# Patient Record
Sex: Female | Born: 1935 | Race: Black or African American | Hispanic: No | Smoking: Never smoker
Health system: Southern US, Community
[De-identification: ages and names within clinical notes are randomized; demographics above are authoritative.]

## PROBLEM LIST (undated history)

## (undated) DIAGNOSIS — M1990 Unspecified osteoarthritis, unspecified site: Secondary | ICD-10-CM

## (undated) DIAGNOSIS — G4730 Sleep apnea, unspecified: Secondary | ICD-10-CM

## (undated) DIAGNOSIS — E786 Lipoprotein deficiency: Secondary | ICD-10-CM

## (undated) DIAGNOSIS — I10 Essential (primary) hypertension: Secondary | ICD-10-CM

## (undated) DIAGNOSIS — E039 Hypothyroidism, unspecified: Secondary | ICD-10-CM

## (undated) DIAGNOSIS — R519 Headache, unspecified: Secondary | ICD-10-CM

## (undated) DIAGNOSIS — Z8679 Personal history of other diseases of the circulatory system: Secondary | ICD-10-CM

## (undated) DIAGNOSIS — F329 Major depressive disorder, single episode, unspecified: Secondary | ICD-10-CM

## (undated) DIAGNOSIS — R0600 Dyspnea, unspecified: Secondary | ICD-10-CM

## (undated) DIAGNOSIS — F32A Depression, unspecified: Secondary | ICD-10-CM

## (undated) DIAGNOSIS — E785 Hyperlipidemia, unspecified: Secondary | ICD-10-CM

## (undated) DIAGNOSIS — F419 Anxiety disorder, unspecified: Secondary | ICD-10-CM

## (undated) HISTORY — DX: Major depressive disorder, single episode, unspecified: F32.9

## (undated) HISTORY — PX: APPENDECTOMY: SHX54

## (undated) HISTORY — DX: Unspecified osteoarthritis, unspecified site: M19.90

## (undated) HISTORY — PX: ABDOMINAL HYSTERECTOMY: SHX81

## (undated) HISTORY — PX: EYE SURGERY: SHX253

## (undated) HISTORY — DX: Depression, unspecified: F32.A

## (undated) HISTORY — DX: Anxiety disorder, unspecified: F41.9

## (undated) HISTORY — DX: Hyperlipidemia, unspecified: E78.5

---

## 2003-09-17 ENCOUNTER — Other Ambulatory Visit: Payer: Self-pay

## 2005-05-02 ENCOUNTER — Ambulatory Visit: Payer: Self-pay | Admitting: Family Medicine

## 2006-01-13 ENCOUNTER — Ambulatory Visit: Payer: Self-pay | Admitting: Internal Medicine

## 2006-05-08 ENCOUNTER — Ambulatory Visit: Payer: Self-pay | Admitting: Family Medicine

## 2007-01-07 ENCOUNTER — Ambulatory Visit: Payer: Self-pay | Admitting: General Surgery

## 2007-05-20 ENCOUNTER — Ambulatory Visit: Payer: Self-pay | Admitting: Family Medicine

## 2008-05-23 ENCOUNTER — Ambulatory Visit: Payer: Self-pay | Admitting: Family Medicine

## 2008-09-02 DIAGNOSIS — I219 Acute myocardial infarction, unspecified: Secondary | ICD-10-CM

## 2008-09-02 HISTORY — DX: Acute myocardial infarction, unspecified: I21.9

## 2008-11-08 ENCOUNTER — Inpatient Hospital Stay: Payer: Self-pay | Admitting: Cardiology

## 2008-12-09 ENCOUNTER — Ambulatory Visit: Payer: Self-pay | Admitting: Family Medicine

## 2009-02-28 ENCOUNTER — Emergency Department: Payer: Self-pay | Admitting: Emergency Medicine

## 2009-05-30 ENCOUNTER — Ambulatory Visit: Payer: Self-pay | Admitting: Family Medicine

## 2009-07-10 ENCOUNTER — Ambulatory Visit: Payer: Self-pay | Admitting: Ophthalmology

## 2009-07-20 ENCOUNTER — Ambulatory Visit: Payer: Self-pay | Admitting: Ophthalmology

## 2010-01-10 ENCOUNTER — Ambulatory Visit: Payer: Self-pay | Admitting: Family Medicine

## 2010-06-15 ENCOUNTER — Ambulatory Visit: Payer: Self-pay | Admitting: Family Medicine

## 2011-03-03 ENCOUNTER — Ambulatory Visit: Payer: Self-pay | Admitting: Internal Medicine

## 2011-05-29 ENCOUNTER — Ambulatory Visit: Payer: Self-pay | Admitting: Family Medicine

## 2011-06-19 ENCOUNTER — Ambulatory Visit: Payer: Self-pay | Admitting: Family Medicine

## 2011-12-28 ENCOUNTER — Emergency Department: Payer: Self-pay | Admitting: Internal Medicine

## 2012-06-10 ENCOUNTER — Ambulatory Visit: Payer: Self-pay | Admitting: Family Medicine

## 2012-06-22 ENCOUNTER — Ambulatory Visit: Payer: Self-pay | Admitting: Family Medicine

## 2012-08-09 ENCOUNTER — Emergency Department: Payer: Self-pay | Admitting: Emergency Medicine

## 2012-08-09 ENCOUNTER — Inpatient Hospital Stay (HOSPITAL_COMMUNITY)
Admission: EM | Admit: 2012-08-09 | Discharge: 2012-08-11 | DRG: 086 | Disposition: A | Discharge status: Home / Self Care | Payer: Medicare Other | Source: Other Acute Inpatient Hospital | Attending: Neurological Surgery | Admitting: Neurological Surgery

## 2012-08-09 ENCOUNTER — Encounter (HOSPITAL_COMMUNITY): Payer: Self-pay

## 2012-08-09 ENCOUNTER — Inpatient Hospital Stay (HOSPITAL_COMMUNITY): Payer: Medicare Other

## 2012-08-09 DIAGNOSIS — S065X9A Traumatic subdural hemorrhage with loss of consciousness of unspecified duration, initial encounter: Secondary | ICD-10-CM

## 2012-08-09 DIAGNOSIS — S065X0A Traumatic subdural hemorrhage without loss of consciousness, initial encounter: Principal | ICD-10-CM | POA: Diagnosis present

## 2012-08-09 DIAGNOSIS — Z79899 Other long term (current) drug therapy: Secondary | ICD-10-CM

## 2012-08-09 DIAGNOSIS — Z7982 Long term (current) use of aspirin: Secondary | ICD-10-CM

## 2012-08-09 DIAGNOSIS — E039 Hypothyroidism, unspecified: Secondary | ICD-10-CM | POA: Diagnosis present

## 2012-08-09 DIAGNOSIS — I10 Essential (primary) hypertension: Secondary | ICD-10-CM | POA: Diagnosis present

## 2012-08-09 DIAGNOSIS — W19XXXA Unspecified fall, initial encounter: Secondary | ICD-10-CM | POA: Diagnosis present

## 2012-08-09 DIAGNOSIS — Z6841 Body Mass Index (BMI) 40.0 and over, adult: Secondary | ICD-10-CM

## 2012-08-09 HISTORY — DX: Hypothyroidism, unspecified: E03.9

## 2012-08-09 HISTORY — DX: Sleep apnea, unspecified: G47.30

## 2012-08-09 HISTORY — DX: Essential (primary) hypertension: I10

## 2012-08-09 LAB — CBC
HCT: 30.4 % — ABNORMAL LOW (ref 35.0–47.0)
HGB: 9.8 g/dL — ABNORMAL LOW (ref 12.0–16.0)
MCH: 29.5 pg (ref 26.0–34.0)
MCHC: 32.3 g/dL (ref 32.0–36.0)
MCV: 91 fL (ref 80–100)
Platelet: 281 10*3/uL (ref 150–440)
RBC: 3.32 10*6/uL — ABNORMAL LOW (ref 3.80–5.20)
RDW: 17.7 % — ABNORMAL HIGH (ref 11.5–14.5)
WBC: 4.6 10*3/uL (ref 3.6–11.0)

## 2012-08-09 LAB — COMPREHENSIVE METABOLIC PANEL
Albumin: 3.3 g/dL — ABNORMAL LOW (ref 3.4–5.0)
Alkaline Phosphatase: 104 U/L (ref 50–136)
Anion Gap: 9 (ref 7–16)
BUN: 14 mg/dL (ref 7–18)
Bilirubin,Total: 0.6 mg/dL (ref 0.2–1.0)
Calcium, Total: 9.6 mg/dL (ref 8.5–10.1)
Chloride: 102 mmol/L (ref 98–107)
Co2: 24 mmol/L (ref 21–32)
Creatinine: 0.84 mg/dL (ref 0.60–1.30)
EGFR (African American): >60
EGFR (Non-African Amer.): >60
Glucose: 112 mg/dL — ABNORMAL HIGH (ref 65–99)
Osmolality: 271 (ref 275–301)
Potassium: 3.6 mmol/L (ref 3.5–5.1)
SGOT(AST): 20 U/L (ref 15–37)
SGPT (ALT): 14 U/L (ref 12–78)
Sodium: 135 mmol/L — ABNORMAL LOW (ref 136–145)
Total Protein: 8.1 g/dL (ref 6.4–8.2)

## 2012-08-09 LAB — URINALYSIS, COMPLETE
Bacteria: NONE SEEN
Bilirubin,UR: NEGATIVE
Blood: NEGATIVE
Glucose,UR: NEGATIVE mg/dL (ref 0–75)
Nitrite: NEGATIVE
Ph: 6 (ref 4.5–8.0)
Protein: NEGATIVE
RBC,UR: 3 /HPF (ref 0–5)
Specific Gravity: 1.023 (ref 1.003–1.030)
Squamous Epithelial: 4
WBC UR: 5 /HPF (ref 0–5)

## 2012-08-09 LAB — CK TOTAL AND CKMB (NOT AT ARMC)
CK, Total: 39 U/L (ref 21–215)
CK-MB: <0.5 ng/mL — ABNORMAL LOW (ref 0.5–3.6)

## 2012-08-09 LAB — TROPONIN I: Troponin-I: <0.02 ng/mL

## 2012-08-09 MED ORDER — POTASSIUM CHLORIDE CRYS ER 20 MEQ PO TBCR
20.0000 meq | EXTENDED_RELEASE_TABLET | Freq: Two times a day (BID) | ORAL | Status: DC
Start: 2012-08-09 — End: 2012-08-11
  Administered 2012-08-10 – 2012-08-11 (×2): 20 meq via ORAL
  Filled 2012-08-09 (×5): qty 1

## 2012-08-09 MED ORDER — SODIUM CHLORIDE 0.9 % IV SOLN: 250.0000 mL | INTRAVENOUS | Status: DC | PRN

## 2012-08-09 MED ORDER — SODIUM CHLORIDE 0.9 % IJ SOLN
3.0000 mL | Freq: Two times a day (BID) | INTRAMUSCULAR | Status: DC
  Administered 2012-08-10 (×2): 3 mL via INTRAVENOUS

## 2012-08-09 MED ORDER — ALUM & MAG HYDROXIDE-SIMETH 200-200-20 MG/5ML PO SUSP: 30.0000 mL | Freq: Four times a day (QID) | ORAL | Status: DC | PRN

## 2012-08-09 MED ORDER — SODIUM CHLORIDE 0.9 % IJ SOLN: 3.0000 mL | Freq: Two times a day (BID) | INTRAMUSCULAR | Status: DC

## 2012-08-09 MED ORDER — SODIUM CHLORIDE 0.9 % IJ SOLN: 3.0000 mL | INTRAMUSCULAR | Status: DC | PRN

## 2012-08-09 MED ORDER — ZOLPIDEM TARTRATE 5 MG PO TABS: 5.0000 mg | ORAL_TABLET | Freq: Every evening | ORAL | Status: DC | PRN

## 2012-08-09 MED ORDER — LEVOTHYROXINE SODIUM 100 MCG PO TABS
100.0000 ug | ORAL_TABLET | Freq: Every day | ORAL | Status: DC
  Administered 2012-08-11: 100 ug via ORAL
  Filled 2012-08-09 (×3): qty 1

## 2012-08-09 MED ORDER — HYDROCHLOROTHIAZIDE 25 MG PO TABS
25.0000 mg | ORAL_TABLET | Freq: Every day | ORAL | Status: DC
  Administered 2012-08-11: 25 mg via ORAL
  Filled 2012-08-09 (×2): qty 1

## 2012-08-09 MED ORDER — POLYETHYLENE GLYCOL 3350 17 G PO PACK
17.0000 g | PACK | Freq: Every day | ORAL | Status: DC
  Administered 2012-08-11: 17 g via ORAL
  Filled 2012-08-09 (×2): qty 1

## 2012-08-09 NOTE — H&P (Signed)
Danielle [name] is an 78 y.o. female.   Chief Complaint: Right parietal subdural hematoma with shift HPI: Patient is an 78 year old right-handed individual who tells me that about a month ago she had a fall where she struck her head in the right frontal region on some of the furniture. Since that time she's had progressively worsening headache and just recently was noted that her left upper extremity exhibits some weakness she is taken to [facility] hospital emergency room today where CT scan demonstrates the presence of a subacute subdural hematoma in the right parietal region with shift and mass effect causing 4 mm midline shift. Patient was referred here for further definitive care. Up until this point the patient has enjoyed reasonably good health having only minimal medical problems including hypertension and some hypothyroidism.  Past Medical History  Diagnosis Date  . Hypertension     No past surgical history on file.  No family history on file. Social History:  does not have a smoking history on file. She does not have any smokeless tobacco history on file. Her alcohol and drug histories not on file.  Allergies:  Allergies  Allergen Reactions  . Penicillins     Unknown    Medications Prior to Admission  Medication Sig Dispense Refill  . aspirin 81 MG chewable tablet Chew 81 mg by mouth daily.      . hydrochlorothiazide (HYDRODIURIL) 25 MG tablet Take 25 mg by mouth daily.      Marland Kitchen levothyroxine (SYNTHROID, LEVOTHROID) 100 MCG tablet Take 100 mcg by mouth daily.      . polyethylene glycol (MIRALAX / GLYCOLAX) packet Take 17 g by mouth daily as needed. For constipation      . potassium chloride SA (K-DUR,KLOR-CON) 20 MEQ tablet Take 20 mEq by mouth 2 (two) times daily.      Marland Kitchen zolpidem (AMBIEN) 5 MG tablet Take 5 mg by mouth at bedtime as needed. For insomnia        No results found for this or any previous visit (from the past 48 hour(s)). No results found.  Review of Systems   Eyes: Negative.   Respiratory: Negative.   Cardiovascular: Negative.   Gastrointestinal: Negative.   Genitourinary: Negative.   Musculoskeletal: Negative.   Skin: Negative.   Neurological: Positive for weakness and headaches.  Endo/Heme/Allergies: Negative.   Psychiatric/Behavioral: Negative.     Blood pressure 136/49, pulse 95, resp. rate 21, height 5\' 3"  (1.6 m), weight 123.9 kg (273 lb 2.4 oz), SpO2 100.00%. Physical Exam  Constitutional: She is oriented to person, place, and time. She appears well-developed and well-nourished.  HENT:  Head: Normocephalic and atraumatic.  Eyes: Conjunctivae normal and EOM are normal. Pupils are equal, round, and reactive to light.  Neck: Normal range of motion. Neck supple.  Cardiovascular: Normal rate, regular rhythm and normal heart sounds.   Respiratory: Effort normal and breath sounds normal.  GI: Soft. Bowel sounds are normal.  Musculoskeletal: Normal range of motion.  Neurological: She is alert and oriented to person, place, and time.       Mild left-sided drift in upper extremity only  Skin: Skin is warm and dry.  Psychiatric: She has a normal mood and affect. Her behavior is normal. Judgment and thought content normal.     Assessment/Plan Subacute subdural hematoma right parietal region with shift and mass effect. Patient is to be admitted now to be made ready for surgery to evacuate the subdural hematoma.  Arlean Thies J 08/09/2012, 10:34 PM

## 2012-08-10 ENCOUNTER — Encounter (HOSPITAL_COMMUNITY): Payer: Self-pay

## 2012-08-10 ENCOUNTER — Encounter (HOSPITAL_COMMUNITY)
Admission: EM | Disposition: A | Discharge status: Home / Self Care | Payer: Self-pay | Source: Other Acute Inpatient Hospital | Attending: Neurological Surgery

## 2012-08-10 LAB — CBC
HCT: 27.4 % — ABNORMAL LOW (ref 36.0–46.0)
Hemoglobin: 9.2 g/dL — ABNORMAL LOW (ref 12.0–15.0)
MCH: 29.3 pg (ref 26.0–34.0)
MCHC: 33.6 g/dL (ref 30.0–36.0)
MCV: 87.3 fL (ref 78.0–100.0)
Platelets: 271 10*3/uL (ref 150–400)
RBC: 3.14 MIL/uL — ABNORMAL LOW (ref 3.87–5.11)
RDW: 17 % — ABNORMAL HIGH (ref 11.5–15.5)
WBC: 3.9 10*3/uL — ABNORMAL LOW (ref 4.0–10.5)

## 2012-08-10 LAB — BASIC METABOLIC PANEL
BUN: 14 mg/dL (ref 6–23)
CO2: 26 mEq/L (ref 19–32)
Calcium: 10 mg/dL (ref 8.4–10.5)
Chloride: 98 mEq/L (ref 96–112)
Creatinine, Ser: 0.75 mg/dL (ref 0.50–1.10)
GFR calc Af Amer: 90 mL/min — ABNORMAL LOW (ref >90)
GFR calc non Af Amer: 77 mL/min — ABNORMAL LOW (ref >90)
Glucose, Bld: 106 mg/dL — ABNORMAL HIGH (ref 70–99)
Potassium: 3.6 mEq/L (ref 3.5–5.1)
Sodium: 133 mEq/L — ABNORMAL LOW (ref 135–145)

## 2012-08-10 LAB — MRSA PCR SCREENING: MRSA by PCR: NEGATIVE

## 2012-08-10 SURGERY — CRANIOTOMY HEMATOMA EVACUATION SUBDURAL
Anesthesia: General | Site: Head | Laterality: Right

## 2012-08-10 MED ORDER — MORPHINE SULFATE 2 MG/ML IJ SOLN: 1.0000 mg | INTRAMUSCULAR | Status: DC | PRN

## 2012-08-10 MED ORDER — OXYCODONE-ACETAMINOPHEN 5-325 MG PO TABS
1.0000 | ORAL_TABLET | ORAL | Status: DC | PRN
  Administered 2012-08-10: 1 via ORAL
  Filled 2012-08-10: qty 1

## 2012-08-10 NOTE — Progress Notes (Signed)
Patient ID: Danielle [name], female   DOB: September 18, 1928, 78 y.o.   MRN: [id number] Patient was scheduled for surgery today. Patient has a right parietal subdural hematoma which is subacute in nature. Is approximately 4 mm of shift. Patient has been having headaches. She denies them today. The patient also had some episodes of left upper extremity weakness, she has not had episodes of weakness while here in the hospital. After extensive discussion with her family the patient declined surgery at this time she wishes to be discharged home. I discussed with him the possible occurrences after discharge that she may have further deterioration in surgery may become a more urgent matter. I will schedule followup visit in a month's time if things are well otherwise. She may have her regular diet at this time.

## 2012-08-10 NOTE — Progress Notes (Signed)
UR completed 

## 2012-08-10 NOTE — Discharge Summary (Addendum)
Physician Discharge Summary  Patient ID: Danielle [name] MRN: [id number] DOB/AGE: [birth year] 78 y.o.  Admit date: 08/09/2012 Discharge date: 08/10/2012  Admission Diagnoses: Subacute subdural hematoma right parietal , Discharge Diagnoses: Subacute subdural hematoma, right parietal,MORBID oBESITY; BMI 48 Active Problems:  * No active hospital problems. *    Discharged Condition: fair  Hospital Course: Patient was admitted being transferred from [facility] hospital for complaints of left-sided weakness and headache that had been ongoing for over a week's period time. Patient had a large subdural hematoma that was subacute in nature CT scan she had 4 mm of shift. Patient was advised regarding the need for surgery. She was scheduled for surgery on the afternoon of the ninth, however after discussions with the patient's family she declined surgical intervention. She is discharged home. She has been cautioned regarding the possible adverse outcomes of her decision. At this time however she declined surgery.  Consults: None  Significant Diagnostic Studies: None  Treatments: None  Discharge Exam: Blood pressure 131/51, pulse 81, temperature 99.2 F (37.3 C), temperature source Oral, resp. rate 14, height 5\' 3"  (1.6 m), weight 123.9 kg (273 lb 2.4 oz), SpO2 99.00%. Patient is alert and oriented she does not exhibit any signs of left-sided weakness at this time station and gait are intact  Disposition: Discharge home  Discharge Orders    Future Orders Please Complete By Expires   Diet - low sodium heart healthy      Increase activity slowly          Medication List     As of 08/10/2012  7:08 PM    TAKE these medications         aspirin 81 MG chewable tablet   Chew 81 mg by mouth daily.      hydrochlorothiazide 25 MG tablet   Commonly known as: HYDRODIURIL   Take 25 mg by mouth daily.      levothyroxine 100 MCG tablet   Commonly known as: SYNTHROID, LEVOTHROID   Take 100  mcg by mouth daily.      polyethylene glycol packet   Commonly known as: MIRALAX / GLYCOLAX   Take 17 g by mouth daily as needed. For constipation      potassium chloride SA 20 MEQ tablet   Commonly known as: K-DUR,KLOR-CON   Take 20 mEq by mouth 2 (two) times daily.      zolpidem 5 MG tablet   Commonly known as: AMBIEN   Take 5 mg by mouth at bedtime as needed. For insomnia         Signed: Stefani Dama 08/10/2012, 7:08 PM

## 2012-08-11 NOTE — Clinical Documentation Improvement (Signed)
BMI DOCUMENTATION CLARIFICATION QUERY  THIS DOCUMENT IS NOT A PERMANENT PART OF THE MEDICAL RECORD         08/11/12  Dear Dr. Danielle Dess,  In an effort to better capture your patient's severity of illness, reflect appropriate length of stay and utilization of resources, a review of the patient medical record has revealed the following indicators.   Based on your clinical judgment, please clarify and document in a progress note and/or discharge summary the clinical condition associated with the following supporting information: In responding to this query please exercise your independent judgment.  The fact that a query is asked, does not imply that any particular answer is desired or expected.    Hello Dr. Danielle Dess!  According to the documented Height and Weight in CHL/EPIC, the patients BMI is 48.39. If your clinical findings/judgment agrees with this,if possible could you please help clarify the suspected diagnosis in the progress note and discharge summary. THANK YOU!    [facility]: A diagnosis of UNDERWEIGHT or MORBID OBESITY should have the BMI documented along with it.  Possible Clinical Conditions?  - Morbid Obesity  - Other condition (please document in the progress notes and/or discharge summary)  - Cannot Clinically determine at this time   Supporting Information:  Estimated Body mass index is 48.39 kg/(m^2) as calculated from the following:   Height as of this encounter: 5\' 3" (1.6 m).   Weight as of this encounter: 273 lb 2.4 oz(123.9 kg).    additional documentation in chart upon review. SM   Thank You,  Saul Fordyce  Clinical Documentation Specialist: [phone] Pager  Health Information Management [place]  Addendum written and discharge note

## 2012-08-11 NOTE — Progress Notes (Addendum)
Pt given d/c instructions, reviewed medications, f/u, resources, etc. Pt refused mychart offer d/t the fact that she does not have a computer. Pt and family are requesting CT scan results, medical records contacted and papers completed.  Lennie Muckle Leigh/   12:20 PM  Pt given cd of chest xray and head CT imaging. Awaiting 7-10 days of processing until reports can be released. Pt and family aware.   Pt's slurred speech resolved.   Holly Bodily

## 2012-08-11 NOTE — Progress Notes (Signed)
Pt d/c home via wheelchair by NT with niece and daughter. Pt requested to be taken by medical records to give consent for requested records to be mailed to her home. Rolling walker sent w/ pt. Pt tolerated use well while ambulating around the room.   Holly Bodily

## 2012-08-11 NOTE — Progress Notes (Signed)
Pt noted to be having slurred speech and is somewhat slower with movements and speech. Per other RN, pt has had this before while here in the hospital. Pt otherwise unchanged neurologically. Dr. Danielle Dess notified. No orders received. Pt ok to d/c home per MD.   Holly Bodily

## 2012-08-11 NOTE — Plan of Care (Signed)
Problem: Consults Goal: Diagnosis - Craniotomy Outcome: Not Applicable Date Met:  08/11/12 Subdural hematoma

## 2013-06-21 ENCOUNTER — Encounter: Payer: Self-pay | Admitting: Podiatry

## 2013-06-21 ENCOUNTER — Ambulatory Visit (INDEPENDENT_AMBULATORY_CARE_PROVIDER_SITE_OTHER): Payer: 59 | Admitting: Podiatry

## 2013-06-21 VITALS — BP 159/83 | HR 77 | Resp 16 | Ht 63.0 in | Wt 272.0 lb

## 2013-06-21 DIAGNOSIS — B351 Tinea unguium: Secondary | ICD-10-CM

## 2013-06-21 DIAGNOSIS — M79609 Pain in unspecified limb: Secondary | ICD-10-CM | POA: Insufficient documentation

## 2013-06-21 NOTE — Progress Notes (Signed)
Danielle [name] presents today for painful toenails one through 5 bilateral.  Objective: Pulses palpable bilateral. Painful elongated toenails one through 5 bilateral.  Assessment: Pain in limb secondary to onychomycosis.  Plan: Debridement of nails in thickness was cover service pain. Followup with her in 3 months.

## 2013-06-23 ENCOUNTER — Ambulatory Visit: Payer: Self-pay | Admitting: Family Medicine

## 2013-09-20 ENCOUNTER — Ambulatory Visit (INDEPENDENT_AMBULATORY_CARE_PROVIDER_SITE_OTHER): Payer: 59 | Admitting: Podiatry

## 2013-09-20 ENCOUNTER — Encounter: Payer: Self-pay | Admitting: Podiatry

## 2013-09-20 VITALS — BP 130/68 | HR 79 | Resp 18

## 2013-09-20 DIAGNOSIS — M79609 Pain in unspecified limb: Secondary | ICD-10-CM

## 2013-09-20 DIAGNOSIS — B351 Tinea unguium: Secondary | ICD-10-CM

## 2013-09-20 NOTE — Progress Notes (Signed)
Just the nails.  Objective: Vital signs are stable she is alert and oriented x3. Her nails are thick yellow dystrophic clinically mycotic and painful palpation she also has a corn to the PIPJ fourth digit of the right foot.  Assessment: Pain in limb secondary to onychomycosis 1 through 5 bilateral. Reactive hyperkeratotic lesion dorsal aspect PIPJ fourth digit right foot.  Plan: Debridement of all reactive hyperkeratosis. Debridement of nails 1 through 5 bilateral covered service secondary to pain. Followup with her in 2-3 months.

## 2013-12-20 ENCOUNTER — Other Ambulatory Visit: Payer: Self-pay | Admitting: *Deleted

## 2013-12-20 ENCOUNTER — Ambulatory Visit (INDEPENDENT_AMBULATORY_CARE_PROVIDER_SITE_OTHER): Payer: 59 | Admitting: Podiatry

## 2013-12-20 VITALS — BP 142/77 | HR 65 | Resp 16

## 2013-12-20 DIAGNOSIS — M79609 Pain in unspecified limb: Secondary | ICD-10-CM

## 2013-12-20 DIAGNOSIS — B351 Tinea unguium: Secondary | ICD-10-CM

## 2013-12-20 NOTE — Progress Notes (Signed)
She presents today with a chief complaint of painful elongated toenails one through 5 bilateral.  Objective: Vital signs are stable she is alert and oriented x3 pulses are palpable nails are thick yellow dystrophic with mycotic and painful palpation.  Assessment: Pain in limb secondary to onychomycosis 1 through 5 bilateral.  Plan: Debridement of nails 1 through 5 bilateral is cover service secondary to pain followup with her in 3 months.

## 2014-03-21 ENCOUNTER — Ambulatory Visit (INDEPENDENT_AMBULATORY_CARE_PROVIDER_SITE_OTHER): Payer: 59 | Admitting: Podiatry

## 2014-03-21 VITALS — BP 138/62 | HR 83 | Resp 16

## 2014-03-21 DIAGNOSIS — B351 Tinea unguium: Secondary | ICD-10-CM

## 2014-03-21 DIAGNOSIS — M79676 Pain in unspecified toe(s): Secondary | ICD-10-CM

## 2014-03-21 DIAGNOSIS — M79609 Pain in unspecified limb: Secondary | ICD-10-CM

## 2014-03-21 NOTE — Progress Notes (Signed)
She presents today with her daughter for chief complaint of painful elongated toenails.  Objective: Pulses are palpable bilateral. Pain in limb secondary to onychomycosis 1 through 5 bilateral. Hammertoe deformity fourth right.  Assessment: Pain in limb secondary to onychomycosis 1 through 5 bilateral.  Plan: Debridement of nails 1 through 5 bilateral covered service secondary to pain.

## 2014-05-29 DIAGNOSIS — I10 Essential (primary) hypertension: Secondary | ICD-10-CM | POA: Insufficient documentation

## 2014-05-29 DIAGNOSIS — I2510 Atherosclerotic heart disease of native coronary artery without angina pectoris: Secondary | ICD-10-CM | POA: Insufficient documentation

## 2014-05-29 DIAGNOSIS — E785 Hyperlipidemia, unspecified: Secondary | ICD-10-CM | POA: Insufficient documentation

## 2014-06-22 ENCOUNTER — Ambulatory Visit: Payer: 59 | Admitting: Podiatry

## 2014-06-24 ENCOUNTER — Ambulatory Visit: Payer: Self-pay | Admitting: Family Medicine

## 2014-07-20 ENCOUNTER — Ambulatory Visit (INDEPENDENT_AMBULATORY_CARE_PROVIDER_SITE_OTHER): Payer: 59 | Admitting: Podiatry

## 2014-07-20 DIAGNOSIS — M79676 Pain in unspecified toe(s): Secondary | ICD-10-CM

## 2014-07-20 DIAGNOSIS — B351 Tinea unguium: Secondary | ICD-10-CM

## 2014-07-20 NOTE — Progress Notes (Signed)
Presents today chief complaint of painful elongated toenails.  Objective: Pulses are palpable bilateral nails are thick, yellow dystrophic onychomycosis and painful palpation.   Assessment: Onychomycosis with pain in limb.  Plan: Treatment of nails in thickness and length as covered service secondary to pain.  

## 2014-10-26 ENCOUNTER — Ambulatory Visit (INDEPENDENT_AMBULATORY_CARE_PROVIDER_SITE_OTHER): Payer: Medicare Other | Admitting: Podiatry

## 2014-10-26 ENCOUNTER — Encounter: Payer: Self-pay | Admitting: Podiatry

## 2014-10-26 DIAGNOSIS — M79676 Pain in unspecified toe(s): Secondary | ICD-10-CM

## 2014-10-26 DIAGNOSIS — L84 Corns and callosities: Secondary | ICD-10-CM

## 2014-10-26 DIAGNOSIS — B351 Tinea unguium: Secondary | ICD-10-CM

## 2014-10-26 NOTE — Progress Notes (Signed)
Presents today chief complaint of painful elongated toenails and painful porokeratotic callus to the dorsal aspect fourth digit right foot.  Objective: Pulses are palpable bilateral nails are thick, yellow dystrophic onychomycosis and painful palpation. Thick porokeratotic lesion without erythema or signs of infection DIPJ fourth digit right.  Assessment: Onychomycosis with pain in limb. Pain in limb associated with porokeratosis fourth digit right foot. Hammertoe deformity fourth right. Diabetes.  Plan: Treatment of nails in thickness and length as covered service secondary to pain. Debrided reactive hyperkeratosis.

## 2015-01-09 ENCOUNTER — Ambulatory Visit (INDEPENDENT_AMBULATORY_CARE_PROVIDER_SITE_OTHER): Payer: Medicare Other | Admitting: Podiatry

## 2015-01-09 DIAGNOSIS — M79676 Pain in unspecified toe(s): Secondary | ICD-10-CM | POA: Diagnosis not present

## 2015-01-09 DIAGNOSIS — B351 Tinea unguium: Secondary | ICD-10-CM | POA: Diagnosis not present

## 2015-01-09 DIAGNOSIS — L84 Corns and callosities: Secondary | ICD-10-CM

## 2015-01-09 NOTE — Progress Notes (Signed)
Presents today chief complaint of painful elongated toenails and painful porokeratotic callus to the dorsal aspect fourth digit right foot.  Objective: Pulses are palpable bilateral nails are thick, yellow dystrophic onychomycosis and painful palpation. Thick porokeratotic lesion without erythema or signs of infection DIPJ fourth digit right.  Assessment: Onychomycosis 1-5 bilateral with pain in limb. Pain in limb associated with porokeratosis fourth digit right foot. Hammertoe deformity fourth right. Diabetes.  Plan: Treatment of nails 1-5  bilateral in thickness and length as covered service secondary to pain. Debrided reactive hyperkeratosis  R 4th toe..Marland Kitchen

## 2015-04-11 ENCOUNTER — Ambulatory Visit (INDEPENDENT_AMBULATORY_CARE_PROVIDER_SITE_OTHER): Payer: Medicare Other | Admitting: Podiatry

## 2015-04-11 DIAGNOSIS — M79676 Pain in unspecified toe(s): Secondary | ICD-10-CM | POA: Diagnosis not present

## 2015-04-11 DIAGNOSIS — B351 Tinea unguium: Secondary | ICD-10-CM

## 2015-04-11 NOTE — Progress Notes (Signed)
Subjective: 80 y.o. returns the office today for painful, elongated, thickened toenails which she is unable to trim herself. Denies any redness or drainage around the nails. States shoes cause irritation to the toenails. Denies any acute changes since last appointment and no new complaints today. Denies any systemic complaints such as fevers, chills, nausea, vomiting.   Objective: AAO 3, NAD DP/PT pulses palpable, CRT less than 3 seconds Nails hypertrophic, dystrophic, elongated, brittle, discolored 10. There is tenderness overlying the nails 1-5 bilatearlly. There is no surrounding erythema or drainage along the nail sites. Right 4th digit previous hyperkerotic lesion stable without any significant build-up at this time.  No open lesions or pre-ulcerative lesions are identified. No other areas of tenderness bilateral lower extremities. No overlying edema, erythema, increased warmth. No pain with calf compression, swelling, warmth, erythema.  Assessment: Patient presents with symptomatic onychomycosis  Plan: -Treatment options including alternatives, risks, complications were discussed -Nails sharply debrided 10 without complication/bleeding. -Discussed daily foot inspection. If there are any changes, to call the office immediately.  -Follow-up in 3 months or sooner if any problems are to arise. In the meantime, encouraged to call the office with any questions, concerns, changes symptoms.   Ovid Curd, DPM

## 2015-07-11 ENCOUNTER — Ambulatory Visit (INDEPENDENT_AMBULATORY_CARE_PROVIDER_SITE_OTHER): Payer: Medicare Other | Admitting: Sports Medicine

## 2015-07-11 ENCOUNTER — Ambulatory Visit: Payer: Medicare Other

## 2015-07-11 ENCOUNTER — Ambulatory Visit: Payer: Medicare Other | Admitting: Podiatry

## 2015-07-11 ENCOUNTER — Encounter: Payer: Self-pay | Admitting: Sports Medicine

## 2015-07-11 DIAGNOSIS — M79676 Pain in unspecified toe(s): Secondary | ICD-10-CM

## 2015-07-11 DIAGNOSIS — M2040 Other hammer toe(s) (acquired), unspecified foot: Secondary | ICD-10-CM

## 2015-07-11 DIAGNOSIS — B351 Tinea unguium: Secondary | ICD-10-CM | POA: Diagnosis not present

## 2015-07-11 NOTE — Progress Notes (Signed)
Patient ID: Danielle [name], female   DOB: 11/04/1928, 81 y.o.   MRN: [id number] Subjective: Danielle [name] is a 81 y.o. female patient seen today in office with complaint of painful thickened and elongated toenails; unable to trim. Patient denies history of Diabetes, Neuropathy, or Vascular disease. Patient has no other pedal complaints at this time.   Patient Active Problem List   Diagnosis Date Noted  . Pain in limb 06/21/2013  . Dermatophytosis of nail 06/21/2013   Current Outpatient Prescriptions on File Prior to Visit  Medication Sig Dispense Refill  . hydrochlorothiazide (HYDRODIURIL) 25 MG tablet Take 25 mg by mouth daily.    Marland Kitchen. levothyroxine (SYNTHROID, LEVOTHROID) 100 MCG tablet Take 100 mcg by mouth daily.    . metoprolol succinate (TOPROL-XL) 25 MG 24 hr tablet     . polyethylene glycol (MIRALAX / GLYCOLAX) packet Take 17 g by mouth daily as needed. For constipation    . potassium chloride SA (K-DUR,KLOR-CON) 20 MEQ tablet Take 20 mEq by mouth 2 (two) times daily.    Marland Kitchen. zolpidem (AMBIEN) 5 MG tablet Take 5 mg by mouth at bedtime as needed. For insomnia     No current facility-administered medications on file prior to visit.   Allergies  Allergen Reactions  . Penicillins     Unknown Unknown  . Penicillin V Potassium Nausea And Vomiting and Rash     Objective: Physical Exam  General: Well developed, nourished, no acute distress, awake, alert and oriented x 3. Cane assisted gait.   Vascular: Dorsalis pedis artery 1/4 bilateral, Posterior tibial artery 1/4 bilateral, skin temperature warm to warm proximal to distal bilateral lower extremities, no varicosities, scant pedal hair present bilateral.  Neurological: Gross sensation present via light touch bilateral.   Dermatological: Skin is warm, dry, and supple bilateral, Nails 1-10 are tender, long, thick, and discolored with mild subungal debris, no webspace macerations present bilateral, no open lesions present bilateral,  asymptomatic corns with no keratotic build up lesser hammertoes bilateral with no signs of infection bilateral.  Musculoskeletal: Asymptomatic hammetoe boney deformities noted bilateral. Muscular strength within normal limits without pain or limitation on range of motion. No pain with calf compression bilateral.  Assessment and Plan:  Problem List Items Addressed This Visit      Musculoskeletal and Integument   Dermatophytosis of nail - Primary     Other   Pain in limb    Other Visit Diagnoses    Hammer toe, unspecified laterality          -Examined patient.  -Discussed treatment options for painful mycotic nails. -Mechanically debrided nails x 10 using sterile nail nipper and dremel without incident -Recommend to continue with good supportive shoes daily.   -Patient to return in 3 months for follow up evaluation or sooner if symptoms worsen.  Asencion Islamitorya Nichol Ator, DPM

## 2015-10-10 ENCOUNTER — Ambulatory Visit: Payer: Medicare Other | Admitting: Sports Medicine

## 2015-10-11 ENCOUNTER — Encounter: Payer: Self-pay | Admitting: Podiatry

## 2015-10-11 ENCOUNTER — Ambulatory Visit (INDEPENDENT_AMBULATORY_CARE_PROVIDER_SITE_OTHER): Payer: Medicare Other | Admitting: Podiatry

## 2015-10-11 DIAGNOSIS — B351 Tinea unguium: Secondary | ICD-10-CM | POA: Diagnosis not present

## 2015-10-11 DIAGNOSIS — M79676 Pain in unspecified toe(s): Secondary | ICD-10-CM

## 2015-10-11 DIAGNOSIS — Q828 Other specified congenital malformations of skin: Secondary | ICD-10-CM

## 2015-10-11 NOTE — Progress Notes (Signed)
She presents today with her daughter with a chief complaint of painful elongated toenails and a corn to the dorsal aspect of the fourth digit right foot.  Objective: Vital signs are stable she is alert and oriented 3. Pulses are palpable bilateral. Toenails are thick yellow dystrophic likely mycotic and painful on palpation. Porokeratotic or keratinized lesion dorsal aspect PIPJ fourth digit right foot pre-ulcerative in nature.  Assessment: Corn fourth toe right foot non-complicated pain in limb secondary to onychomycosis.  Plan: Debridement of toenails and reactive hyperkeratoses bilateral.

## 2015-11-06 ENCOUNTER — Other Ambulatory Visit: Payer: Self-pay | Admitting: Family Medicine

## 2015-11-06 DIAGNOSIS — Z0000 Encounter for general adult medical examination without abnormal findings: Secondary | ICD-10-CM

## 2015-11-06 DIAGNOSIS — E559 Vitamin D deficiency, unspecified: Secondary | ICD-10-CM

## 2015-11-21 ENCOUNTER — Ambulatory Visit
Admission: RE | Admit: 2015-11-21 | Discharge: 2015-11-21 | Disposition: A | Discharge status: Home / Self Care | Payer: Medicare Other | Source: Ambulatory Visit | Attending: Family Medicine | Admitting: Family Medicine

## 2015-11-21 DIAGNOSIS — Z1231 Encounter for screening mammogram for malignant neoplasm of breast: Secondary | ICD-10-CM | POA: Diagnosis present

## 2015-11-21 DIAGNOSIS — Z0000 Encounter for general adult medical examination without abnormal findings: Secondary | ICD-10-CM

## 2015-11-21 DIAGNOSIS — E559 Vitamin D deficiency, unspecified: Secondary | ICD-10-CM

## 2015-11-21 DIAGNOSIS — M85852 Other specified disorders of bone density and structure, left thigh: Secondary | ICD-10-CM | POA: Insufficient documentation

## 2015-11-30 ENCOUNTER — Ambulatory Visit: Payer: Self-pay | Admitting: Hematology and Oncology

## 2016-01-08 ENCOUNTER — Encounter: Payer: Self-pay | Admitting: Podiatry

## 2016-01-08 ENCOUNTER — Ambulatory Visit (INDEPENDENT_AMBULATORY_CARE_PROVIDER_SITE_OTHER): Payer: Medicare Other | Admitting: Podiatry

## 2016-01-08 DIAGNOSIS — M79676 Pain in unspecified toe(s): Secondary | ICD-10-CM

## 2016-01-08 DIAGNOSIS — Q828 Other specified congenital malformations of skin: Secondary | ICD-10-CM | POA: Diagnosis not present

## 2016-01-08 DIAGNOSIS — B351 Tinea unguium: Secondary | ICD-10-CM

## 2016-01-10 NOTE — Progress Notes (Signed)
She presents today with her daughter for chief complaint of painful elongated toenails and multiple calluses.  Objective: Vital signs are stable alert and oriented 3. Pulses are strongly palpable bilateral. Toenails are thick yellow dystrophic onychomycotic multiple porokeratosis and distal clavi noted to digits.  Assessment: Pain in limb secondary to onychomycosis and porokeratosis bilateral.  Plan: Debridement of toenails multiple porokeratotic lesions bilateral. Follow up with me in 3 months.

## 2016-04-10 ENCOUNTER — Encounter: Payer: Self-pay | Admitting: Podiatry

## 2016-04-10 ENCOUNTER — Ambulatory Visit (INDEPENDENT_AMBULATORY_CARE_PROVIDER_SITE_OTHER): Payer: Medicare Other | Admitting: Podiatry

## 2016-04-10 DIAGNOSIS — Q828 Other specified congenital malformations of skin: Secondary | ICD-10-CM | POA: Diagnosis not present

## 2016-04-10 DIAGNOSIS — B351 Tinea unguium: Secondary | ICD-10-CM | POA: Diagnosis not present

## 2016-04-10 DIAGNOSIS — M79676 Pain in unspecified toe(s): Secondary | ICD-10-CM | POA: Diagnosis not present

## 2016-04-10 NOTE — Progress Notes (Signed)
She presents today for chief complaint of painful elongated toenails and calluses to the fourth digit right foot.  Objective: Vital signs are stable alert and oriented 3. Pulses are palpable. Neurologic sensorium is intact. Deep tendon reflexes are intact. Hammertoe deformity resulting in a dorsal PIPJ callus right foot. No open lesions or wounds. Toenails are thick yellow dystrophic onychomycotic.  Assessment: Pain in limb secondary to onychomycosis.  Plan: Debridement of toenails 1 through 5 bilateral debridement of all reactive hyperkeratoses bilateral.

## 2016-07-17 ENCOUNTER — Ambulatory Visit: Payer: Medicare Other | Admitting: Podiatry

## 2016-07-22 ENCOUNTER — Ambulatory Visit: Payer: Medicare Other | Admitting: Podiatry

## 2016-08-05 ENCOUNTER — Ambulatory Visit (INDEPENDENT_AMBULATORY_CARE_PROVIDER_SITE_OTHER): Payer: Medicare Other | Admitting: Podiatry

## 2016-08-05 ENCOUNTER — Encounter: Payer: Self-pay | Admitting: Podiatry

## 2016-08-05 VITALS — Resp 16 | Ht 63.0 in | Wt 263.0 lb

## 2016-08-05 DIAGNOSIS — L84 Corns and callosities: Secondary | ICD-10-CM | POA: Diagnosis not present

## 2016-08-05 DIAGNOSIS — M79676 Pain in unspecified toe(s): Secondary | ICD-10-CM

## 2016-08-05 DIAGNOSIS — B351 Tinea unguium: Secondary | ICD-10-CM

## 2016-08-05 DIAGNOSIS — M2040 Other hammer toe(s) (acquired), unspecified foot: Secondary | ICD-10-CM

## 2016-08-05 NOTE — Progress Notes (Signed)
Complaint:  Visit Type: Patient returns to my office for continued preventative foot care services. Complaint: Patient states" my nails have grown long and thick and become painful to walk and wear shoes" . The patient presents for preventative foot care services. No changes to ROS  Podiatric Exam: Vascular: dorsalis pedis and posterior tibial pulses are palpable bilateral. Capillary return is immediate. Temperature gradient is WNL. Skin turgor WNL  Sensorium: Normal Semmes Weinstein monofilament test. Normal tactile sensation bilaterally. Nail Exam: Pt has thick disfigured discolored nails with subungual debris noted bilateral entire nail hallux through fifth toenails Ulcer Exam: There is no evidence of ulcer or pre-ulcerative changes or infection. Orthopedic Exam: Muscle tone and strength are WNL. No limitations in general ROM. No crepitus or effusions noted. Foot type and digits show no abnormalities. Bony prominences are unremarkable. HAV  B/L  Hammer toe with heloma durum 4th right Skin: No Porokeratosis. No infection or ulcers  Diagnosis:  Onychomycosis, , Pain in right toe, pain in left toes.  Debride corn fourth toe right foot  Treatment & Plan Procedures and Treatment: Consent by patient was obtained for treatment procedures. The patient understood the discussion of treatment and procedures well. All questions were answered thoroughly reviewed. Debridement of mycotic and hypertrophic toenails, 1 through 5 bilateral and clearing of subungual debris. No ulceration, no infection noted.  Return Visit-Office Procedure: Patient instructed to return to the office for a follow up visit 3 months for continued evaluation and treatment.    Helane GuntherGregory Jennet Scroggin DPM

## 2016-11-01 ENCOUNTER — Other Ambulatory Visit: Payer: Self-pay | Admitting: Family Medicine

## 2016-11-01 DIAGNOSIS — Z1231 Encounter for screening mammogram for malignant neoplasm of breast: Secondary | ICD-10-CM

## 2016-11-25 ENCOUNTER — Ambulatory Visit (INDEPENDENT_AMBULATORY_CARE_PROVIDER_SITE_OTHER): Payer: Medicare Other | Admitting: Podiatry

## 2016-11-25 VITALS — BP 174/71 | HR 63 | Resp 16

## 2016-11-25 DIAGNOSIS — B351 Tinea unguium: Secondary | ICD-10-CM

## 2016-11-25 DIAGNOSIS — M79676 Pain in unspecified toe(s): Secondary | ICD-10-CM

## 2016-11-25 DIAGNOSIS — L84 Corns and callosities: Secondary | ICD-10-CM

## 2016-11-25 DIAGNOSIS — M2040 Other hammer toe(s) (acquired), unspecified foot: Secondary | ICD-10-CM

## 2016-11-25 NOTE — Progress Notes (Signed)
Complaint:  Visit Type: Patient returns to my office for continued preventative foot care services. Complaint: Patient states" my nails have grown long and thick and become painful to walk and wear shoes" . The patient presents for preventative foot care services. No changes to ROS  Podiatric Exam: Vascular: dorsalis pedis and posterior tibial pulses are palpable bilateral. Capillary return is immediate. Temperature gradient is WNL. Skin turgor WNL  Sensorium: Normal Semmes Weinstein monofilament test. Normal tactile sensation bilaterally. Nail Exam: Pt has thick disfigured discolored nails with subungual debris noted bilateral entire nail hallux through fifth toenails Ulcer Exam: There is no evidence of ulcer or pre-ulcerative changes or infection. Orthopedic Exam: Muscle tone and strength are WNL. No limitations in general ROM. No crepitus or effusions noted. Foot type and digits show no abnormalities. Bony prominences are unremarkable. HAV  B/L  Hammer toe with heloma durum 4th right Skin: No Porokeratosis. No infection or ulcers  Diagnosis:  Onychomycosis, , Pain in right toe, pain in left toes.  Debride corn fourth toe right foot  Treatment & Plan Procedures and Treatment: Consent by patient was obtained for treatment procedures. The patient understood the discussion of treatment and procedures well. All questions were answered thoroughly reviewed. Debridement of mycotic and hypertrophic toenails, 1 through 5 bilateral and clearing of subungual debris. No ulceration, no infection noted. Debride corn. Return Visit-Office Procedure: Patient instructed to return to the office for a follow up visit 3 months for continued evaluation and treatment.    Helane GuntherGregory Jandel Patriarca DPM

## 2016-12-05 ENCOUNTER — Ambulatory Visit
Admission: RE | Admit: 2016-12-05 | Discharge: 2016-12-05 | Disposition: A | Discharge status: Home / Self Care | Payer: Medicare Other | Source: Ambulatory Visit | Attending: Family Medicine | Admitting: Family Medicine

## 2016-12-05 DIAGNOSIS — Z1231 Encounter for screening mammogram for malignant neoplasm of breast: Secondary | ICD-10-CM

## 2017-03-03 ENCOUNTER — Ambulatory Visit (INDEPENDENT_AMBULATORY_CARE_PROVIDER_SITE_OTHER): Payer: Medicare Other | Admitting: Podiatry

## 2017-03-03 DIAGNOSIS — M79676 Pain in unspecified toe(s): Secondary | ICD-10-CM | POA: Diagnosis not present

## 2017-03-03 DIAGNOSIS — B351 Tinea unguium: Secondary | ICD-10-CM

## 2017-03-03 DIAGNOSIS — M2040 Other hammer toe(s) (acquired), unspecified foot: Secondary | ICD-10-CM

## 2017-03-03 NOTE — Progress Notes (Signed)
Complaint:  Visit Type: Patient returns to my office for continued preventative foot care services. Complaint: Patient states" my nails have grown long and thick and become painful to walk and wear shoes" . The patient presents for preventative foot care services. No changes to ROS  Podiatric Exam: Vascular: dorsalis pedis and posterior tibial pulses are palpable bilateral. Capillary return is immediate. Temperature gradient is WNL. Skin turgor WNL  Sensorium: Normal Semmes Weinstein monofilament test. Normal tactile sensation bilaterally. Nail Exam: Pt has thick disfigured discolored nails with subungual debris noted bilateral entire nail hallux through fifth toenails Ulcer Exam: There is no evidence of ulcer or pre-ulcerative changes or infection. Orthopedic Exam: Muscle tone and strength are WNL. No limitations in general ROM. No crepitus or effusions noted. Foot type and digits show no abnormalities. Bony prominences are unremarkable. HAV  B/L  Hammer toe with heloma durum 4th right Skin: No Porokeratosis. No infection or ulcers  Diagnosis:  Onychomycosis, , Pain in right toe, pain in left toes.  Debride corn fourth toe right foot  Treatment & Plan Procedures and Treatment: Consent by patient was obtained for treatment procedures. The patient understood the discussion of treatment and procedures well. All questions were answered thoroughly reviewed. Debridement of mycotic and hypertrophic toenails, 1 through 5 bilateral and clearing of subungual debris. No ulceration, no infection noted.  Padding applied to corn Return Visit-Office Procedure: Patient instructed to return to the office for a follow up visit 3 months for continued evaluation and treatment.    Helane GuntherGregory Paisley Grajeda DPM

## 2017-06-02 ENCOUNTER — Ambulatory Visit: Payer: Medicare Other | Admitting: Podiatry

## 2017-07-03 ENCOUNTER — Ambulatory Visit (INDEPENDENT_AMBULATORY_CARE_PROVIDER_SITE_OTHER): Payer: Medicare Other | Admitting: Urology

## 2017-07-03 ENCOUNTER — Encounter: Payer: Self-pay | Admitting: Urology

## 2017-07-03 VITALS — BP 181/65 | HR 69 | Ht 63.0 in

## 2017-07-03 DIAGNOSIS — N3941 Urge incontinence: Secondary | ICD-10-CM | POA: Diagnosis not present

## 2017-07-03 DIAGNOSIS — N3946 Mixed incontinence: Secondary | ICD-10-CM

## 2017-07-03 LAB — URINALYSIS, COMPLETE
Bilirubin, UA: NEGATIVE
Glucose, UA: NEGATIVE
Leukocytes, UA: NEGATIVE
Nitrite, UA: NEGATIVE
Protein, UA: NEGATIVE
RBC, UA: NEGATIVE
Specific Gravity, UA: 1.02 (ref 1.005–1.030)
Urobilinogen, Ur: 1 mg/dL (ref 0.2–1.0)
pH, UA: 6 (ref 5.0–7.5)

## 2017-07-03 LAB — MICROSCOPIC EXAMINATION
RBC, UA: NONE SEEN /hpf (ref 0–?)
WBC, UA: NONE SEEN /hpf (ref 0–?)

## 2017-07-03 LAB — BLADDER SCAN AMB NON-IMAGING

## 2017-07-03 MED ORDER — MIRABEGRON ER 25 MG PO TB24: 25.0000 mg | ORAL_TABLET | Freq: Every day | ORAL | 0 refills | Status: DC

## 2017-07-03 NOTE — Progress Notes (Signed)
07/03/2017 6:33 PM   Danielle [name] Sep 04, 1928 [id number]  Referring provider: Hillery Aldo, MD [address] [place], [place] [address]  Chief Complaint  Patient presents with  . Urinary Incontinence    HPI: Danielle [name] is a 83 year old female who presents today in consultation at the request of Dr. Allena Katz for evaluation of urinary incontinence.  He she complains of a 45-month history of urinary urgency occasionally is associated with urge incontinence.  She denies urinary frequency.  She does have nocturia x1-3.  Denies dysuria or gross hematuria.  She denies flank, abdominal or pelvic pain.  There is no history of recurrent UTI and she denies prior urologic evaluation.  She denies stress incontinence or bowel symptoms.  She has had 3 prior spontaneous vaginal deliveries.  There is no history of urologic disease including degenerative disc disease, CVA, or Parkinson's or MS.  She does have decreased mobility due to degenerative joint disease.  She was tried on both oxybutynin and Vesicare without improvement in her symptoms.  She denies stress urinary incontinence.   PMH: Past Medical History:  Diagnosis Date  . Anxiety   . Arthritis   . Depression   . Hyperlipemia   . Hypertension   . Hypothyroidism   . Sleep apnea     Surgical History: Past Surgical History:  Procedure Laterality Date  . ABDOMINAL HYSTERECTOMY    . APPENDECTOMY      Home Medications:  Allergies as of 07/03/2017      Reactions   Ace Inhibitors Other (See Comments)   Angiodema to Ace-inhibitor 2013   Penicillins    Unknown Unknown   Penicillin V Potassium Nausea And Vomiting, Rash      Medication List       Accurate as of 07/03/17  6:33 PM. Always use your most recent med list.          donepezil 5 MG tablet Commonly known as:  ARICEPT Take 5 mg by mouth at bedtime.   hydrochlorothiazide 25 MG tablet Commonly known as:  HYDRODIURIL Take 25 mg by mouth daily.     levothyroxine 150 MCG tablet Commonly known as:  SYNTHROID, LEVOTHROID Take 150 mcg by mouth daily before breakfast.   metoprolol succinate 25 MG 24 hr tablet Commonly known as:  TOPROL-XL   nitroGLYCERIN 0.4 MG SL tablet Commonly known as:  NITROSTAT   oxybutynin 10 MG 24 hr tablet Commonly known as:  DITROPAN-XL   polyethylene glycol powder powder Commonly known as:  GLYCOLAX/MIRALAX   potassium chloride SA 20 MEQ tablet Commonly known as:  K-DUR,KLOR-CON Take 20 mEq by mouth 2 (two) times daily.   sertraline 100 MG tablet Commonly known as:  ZOLOFT   zolpidem 5 MG tablet Commonly known as:  AMBIEN Take 5 mg by mouth at bedtime as needed. For insomnia       Allergies:  Allergies  Allergen Reactions  . Ace Inhibitors Other (See Comments)    Angiodema to Ace-inhibitor 2013  . Penicillins     Unknown Unknown  . Penicillin V Potassium Nausea And Vomiting and Rash    Family History: Family History  Problem Relation Age of Onset  . Bladder Cancer Neg Hx   . Kidney cancer Neg Hx     Social History:  reports that she has never smoked. She has never used smokeless tobacco. She reports that she does not drink alcohol or use drugs.  ROS: UROLOGY Frequent Urination?: No Hard to postpone urination?: Yes Burning/pain with  urination?: No Get up at night to urinate?: Yes Leakage of urine?: No Urine stream starts and stops?: No Trouble starting stream?: No Do you have to strain to urinate?: No Blood in urine?: No Urinary tract infection?: No Sexually transmitted disease?: No Injury to kidneys or bladder?: No Painful intercourse?: No Weak stream?: No Currently pregnant?: No Vaginal bleeding?: No Last menstrual period?: n  Gastrointestinal Nausea?: No Vomiting?: No Indigestion/heartburn?: No Diarrhea?: No Constipation?: Yes  Constitutional Fever: No Night sweats?: No Weight loss?: No Fatigue?: Yes  Skin Skin rash/lesions?: No Itching?:  No  Eyes Blurred vision?: No Double vision?: No  Ears/Nose/Throat Sore throat?: No Sinus problems?: No  Hematologic/Lymphatic Swollen glands?: No Easy bruising?: No  Cardiovascular Leg swelling?: No Chest pain?: No  Respiratory Cough?: No Shortness of breath?: Yes  Endocrine Excessive thirst?: No  Musculoskeletal Back pain?: Yes Joint pain?: Yes  Neurological Headaches?: No Dizziness?: No  Psychologic Depression?: Yes Anxiety?: Yes  Physical Exam: BP (!) 181/65 (BP Location: Left Arm, Patient Position: Sitting, Cuff Size: Large)   Pulse 69   Ht 5\' 3"  (1.6 m)   Constitutional:  Alert and oriented, No acute distress. HEENT: Helix AT, moist mucus membranes.  Trachea midline, no masses. Cardiovascular: No clubbing, cyanosis, or edema. Respiratory: Normal respiratory effort, no increased work of breathing. GI: Abdomen is soft, nontender, nondistended, no abdominal masses GU: No CVA tenderness. Skin: No rashes, bruises or suspicious lesions. Lymph: No cervical or inguinal adenopathy. Neurologic: Grossly intact, no focal deficits, moving all 4 extremities. Psychiatric: Normal mood and affect.  Laboratory Data: Lab Results  Component Value Date   WBC 3.9 (L) 08/10/2012   HGB 9.2 (L) 08/10/2012   HCT 27.4 (L) 08/10/2012   MCV 87.3 08/10/2012   PLT 271 08/10/2012    Lab Results  Component Value Date   CREATININE 0.75 08/10/2012    Urinalysis Lab Results  Component Value Date   SPECGRAV 1.020 07/03/2017   PHUR 6.0 07/03/2017   COLORU Yellow 07/03/2017   APPEARANCEUR Clear 07/03/2017   LEUKOCYTESUR Negative 07/03/2017   PROTEINUR Negative 07/03/2017   GLUCOSEU Negative 07/03/2017   KETONESU Trace (A) 07/03/2017   RBCU Negative 07/03/2017   BILIRUBINUR Negative 07/03/2017   UUROB 1.0 07/03/2017   NITRITE Negative 07/03/2017    Lab Results  Component Value Date   LABMICR See below: 07/03/2017   WBCUA None seen 07/03/2017   RBCUA None seen  07/03/2017   LABEPIT 0-10 07/03/2017   MUCUS Present (A) 07/03/2017   BACTERIA Moderate (A) 07/03/2017    Assessment & Plan:    1. Urge incontinence of urine Urinalysis today without significant findings and PVR by bladder scan was 0 mL.  She has failed to anticholinergic medications.  Will add Myrbetriq 25 mg daily and she was given 4 weeks for the samples.  Near the end of this course she will call back regarding efficacy and if not effective will titrate to 50 mg.  She will return for cystoscopy if no significant improvement on Myrbetriq.  - Urinalysis, Complete - BLADDER SCAN AMB NON-IMAGING    Riki AltesScott C Stoioff, MD  [facility] [address], [address] [place], [place] [address] [phone]

## 2017-07-21 ENCOUNTER — Telehealth: Payer: Self-pay | Admitting: Urology

## 2017-07-21 NOTE — Telephone Encounter (Signed)
Pt saw Stoioff and was put on medication (myrbetriq) at 25mg  and was advised that if the medication worked or if he needed to up the dosage to call the office before she ran out of samples. Pt is calling to say that the medication is somewhat working and she would like a Rx for higher dose called in please. [facility] on [address] Please advise & return call. Thanks.

## 2017-07-22 MED ORDER — MIRABEGRON ER 50 MG PO TB24: 50.0000 mg | ORAL_TABLET | Freq: Every day | ORAL | 3 refills | Status: DC

## 2017-07-22 NOTE — Telephone Encounter (Signed)
Per Dr. Lonna CobbStoioff dictation myrbetriq 50mg  was sent to pt pharmacy. Pt made aware.

## 2017-08-29 ENCOUNTER — Other Ambulatory Visit: Payer: Self-pay

## 2017-08-29 ENCOUNTER — Emergency Department: Payer: Medicare Other

## 2017-08-29 ENCOUNTER — Emergency Department
Admission: EM | Admit: 2017-08-29 | Discharge: 2017-08-29 | Disposition: A | Discharge status: Home / Self Care | Payer: Medicare Other | Attending: Emergency Medicine | Admitting: Emergency Medicine

## 2017-08-29 ENCOUNTER — Encounter: Payer: Self-pay | Admitting: Emergency Medicine

## 2017-08-29 DIAGNOSIS — K2970 Gastritis, unspecified, without bleeding: Secondary | ICD-10-CM | POA: Insufficient documentation

## 2017-08-29 DIAGNOSIS — E039 Hypothyroidism, unspecified: Secondary | ICD-10-CM | POA: Diagnosis not present

## 2017-08-29 DIAGNOSIS — I2510 Atherosclerotic heart disease of native coronary artery without angina pectoris: Secondary | ICD-10-CM | POA: Insufficient documentation

## 2017-08-29 DIAGNOSIS — I10 Essential (primary) hypertension: Secondary | ICD-10-CM | POA: Diagnosis not present

## 2017-08-29 DIAGNOSIS — Z79899 Other long term (current) drug therapy: Secondary | ICD-10-CM | POA: Insufficient documentation

## 2017-08-29 DIAGNOSIS — R112 Nausea with vomiting, unspecified: Secondary | ICD-10-CM | POA: Diagnosis present

## 2017-08-29 LAB — BASIC METABOLIC PANEL
Anion gap: 9 (ref 5–15)
BUN: 22 mg/dL — ABNORMAL HIGH (ref 6–20)
CO2: 20 mmol/L — ABNORMAL LOW (ref 22–32)
Calcium: 9.4 mg/dL (ref 8.9–10.3)
Chloride: 102 mmol/L (ref 101–111)
Creatinine, Ser: 0.85 mg/dL (ref 0.44–1.00)
GFR calc Af Amer: >60 mL/min (ref >60)
GFR calc non Af Amer: >60 mL/min (ref >60)
Glucose, Bld: 188 mg/dL — ABNORMAL HIGH (ref 65–99)
Potassium: 3.6 mmol/L (ref 3.5–5.1)
Sodium: 131 mmol/L — ABNORMAL LOW (ref 135–145)

## 2017-08-29 LAB — CBC
HCT: 37.6 % (ref 35.0–47.0)
Hemoglobin: 12.5 g/dL (ref 12.0–16.0)
MCH: 30.2 pg (ref 26.0–34.0)
MCHC: 33.3 g/dL (ref 32.0–36.0)
MCV: 90.6 fL (ref 80.0–100.0)
Platelets: 216 10*3/uL (ref 150–440)
RBC: 4.15 MIL/uL (ref 3.80–5.20)
RDW: 20 % — ABNORMAL HIGH (ref 11.5–14.5)
WBC: 5 10*3/uL (ref 3.6–11.0)

## 2017-08-29 LAB — DIFFERENTIAL
Basophils Absolute: 0 10*3/uL (ref 0–0.1)
Basophils Relative: 0 %
Eosinophils Absolute: 0 10*3/uL (ref 0–0.7)
Eosinophils Relative: 0 %
Lymphocytes Relative: 8 %
Lymphs Abs: 0.4 10*3/uL — ABNORMAL LOW (ref 1.0–3.6)
Monocytes Absolute: 0.2 10*3/uL (ref 0.2–0.9)
Monocytes Relative: 5 %
Neutro Abs: 4.2 10*3/uL (ref 1.4–6.5)
Neutrophils Relative %: 87 %

## 2017-08-29 LAB — HEPATIC FUNCTION PANEL
ALT: 14 U/L (ref 14–54)
AST: 29 U/L (ref 15–41)
Albumin: 3.6 g/dL (ref 3.5–5.0)
Alkaline Phosphatase: 82 U/L (ref 38–126)
Bilirubin, Direct: <0.1 mg/dL — ABNORMAL LOW (ref 0.1–0.5)
Total Bilirubin: 0.9 mg/dL (ref 0.3–1.2)
Total Protein: 7.5 g/dL (ref 6.5–8.1)

## 2017-08-29 LAB — TROPONIN I: Troponin I: <0.03 ng/mL (ref <0.03)

## 2017-08-29 LAB — URINALYSIS, COMPLETE (UACMP) WITH MICROSCOPIC
Bacteria, UA: NONE SEEN
Bilirubin Urine: NEGATIVE
Glucose, UA: NEGATIVE mg/dL
Hgb urine dipstick: NEGATIVE
Ketones, ur: 20 mg/dL — AB
Leukocytes, UA: NEGATIVE
Nitrite: NEGATIVE
Protein, ur: NEGATIVE mg/dL
Specific Gravity, Urine: 1.023 (ref 1.005–1.030)
WBC, UA: NONE SEEN WBC/hpf (ref 0–5)
pH: 7 (ref 5.0–8.0)

## 2017-08-29 LAB — LIPASE, BLOOD: Lipase: 24 U/L (ref 11–51)

## 2017-08-29 MED ORDER — ONDANSETRON HCL 4 MG/2ML IJ SOLN
4.0000 mg | Freq: Once | INTRAMUSCULAR | Status: AC
  Administered 2017-08-29: 4 mg via INTRAVENOUS
  Filled 2017-08-29: qty 2

## 2017-08-29 MED ORDER — IOPAMIDOL (ISOVUE-300) INJECTION 61%
30.0000 mL | Freq: Once | INTRAVENOUS | Status: AC | PRN
  Administered 2017-08-29: 30 mL via ORAL

## 2017-08-29 MED ORDER — ONDANSETRON 4 MG PO TBDP: 4.0000 mg | ORAL_TABLET | Freq: Three times a day (TID) | ORAL | 0 refills | Status: DC | PRN

## 2017-08-29 MED ORDER — ONDANSETRON 4 MG PO TBDP
4.0000 mg | ORAL_TABLET | Freq: Once | ORAL | Status: AC
  Administered 2017-08-29: 4 mg via ORAL
  Filled 2017-08-29: qty 1

## 2017-08-29 MED ORDER — IOPAMIDOL (ISOVUE-300) INJECTION 61%
100.0000 mL | Freq: Once | INTRAVENOUS | Status: AC | PRN
  Administered 2017-08-29: 100 mL via INTRAVENOUS

## 2017-08-29 NOTE — ED Notes (Signed)

## 2017-08-29 NOTE — Discharge Instructions (Signed)
As we discussed please take your Zofran as prescribed, as needed.  Please follow-up with your doctor for recheck/reevaluation.  Incidentally your CT scan did show several pulmonary nodules although minimal increase over the past 8 years.  We would recommend a repeat CT scan in approximately 6 months to ensure that they are not currently enlarging.  Return to the emergency department for any abdominal pain, development of fever, or if you are unable to keep down fluids due to vomiting.

## 2017-08-29 NOTE — ED Triage Notes (Signed)
Pt arrives from home via ems, pt states that she hasn't been feeling well for the past couple days, pt denies pain, pt becoming nauseated and vomited after pt moved from ems stretcher to the ED stretcher. Pt reports some burning prior to vomiting and states that she has occasionally has been vomiting

## 2017-08-29 NOTE — ED Notes (Signed)
Pt drinking contrast for ct without difficulty

## 2017-08-29 NOTE — ED Provider Notes (Signed)
-----------------------------------------   5:04 PM on 08/29/2017 -----------------------------------------  Patient CT scan consistent with gastritis.  Patient's labs are otherwise normal.  Patient CT did show some enlarged pulmonary nodules although minimally enlarged for the past 8 years.  We will recommend the patient follow-up with her doctor for further CT imaging in the future.  We will dose Zofran and discharge for the same.  I discussed over-the-counter Maalox, Zofran use as well as supportive care at home including fluids and a bland diet.  Patient agreeable to this plan of care.  She states she is feeling much better.  I also discussed return precautions for any worsening pain or fever, or unable to keep down fluids.   Minna AntisPaduchowski, Timberly Yott, MD 08/29/17 [phone]

## 2017-08-29 NOTE — ED Provider Notes (Signed)
[facility] Emergency Department Provider Note   ____________________________________________   First MD Initiated Contact with Patient 08/29/17 1255     (approximate)  I have reviewed the triage vital signs and the nursing notes.   HISTORY  Chief Complaint Weakness    HPI Danielle [name] is a 83 y.o. female Who reports several days ago she had a sore throat so she took some medicine this made her stomach burn now she is just nauseated and a little queasy all over her belly. She is not burning anymore she has no pain or tenderness she did however vomit just before she got here. She is not running a fever she is not having any other pain anywhere.   Past Medical History:  Diagnosis Date  . Anxiety   . Arthritis   . Depression   . Hyperlipemia   . Hypertension   . Hypothyroidism   . Sleep apnea     Patient Active Problem List   Diagnosis Date Noted  . Urge incontinence of urine 07/03/2017  . Coronary arteriosclerosis in native artery 05/29/2014  . CAD (coronary artery disease), native coronary artery 05/29/2014  . Hyperlipidemia 05/29/2014  . Hypertension 05/29/2014  . Hyperlipidemia, unspecified 05/29/2014  . Pain in extremity 06/21/2013  . Onychomycosis due to dermatophyte 06/21/2013    Past Surgical History:  Procedure Laterality Date  . ABDOMINAL HYSTERECTOMY    . APPENDECTOMY      Prior to Admission medications   Medication Sig Start Date End Date Taking? Authorizing Provider  donepezil (ARICEPT) 5 MG tablet Take 5 mg by mouth at bedtime.    [provider]  hydrochlorothiazide (HYDRODIURIL) 25 MG tablet Take 25 mg by mouth daily.    [provider]  levothyroxine (SYNTHROID, LEVOTHROID) 150 MCG tablet Take 150 mcg by mouth daily before breakfast.    [provider]  metoprolol succinate (TOPROL-XL) 25 MG 24 hr tablet  12/03/13   [provider]  mirabegron ER (MYRBETRIQ) 25 MG TB24 tablet Take 1  tablet (25 mg total) by mouth daily. 07/03/17   Stoioff, Verna CzechScott C, MD  mirabegron ER (MYRBETRIQ) 50 MG TB24 tablet Take 1 tablet (50 mg total) by mouth daily. 07/22/17   Stoioff, Verna CzechScott C, MD  nitroGLYCERIN (NITROSTAT) 0.4 MG SL tablet  01/22/17   [provider]  oxybutynin (DITROPAN-XL) 10 MG 24 hr tablet  01/22/17   [provider]  polyethylene glycol powder (GLYCOLAX/MIRALAX) powder  02/21/17   [provider]  potassium chloride SA (K-DUR,KLOR-CON) 20 MEQ tablet Take 20 mEq by mouth 2 (two) times daily.    [provider]  sertraline (ZOLOFT) 100 MG tablet  02/24/17   [provider]  zolpidem (AMBIEN) 5 MG tablet Take 5 mg by mouth at bedtime as needed. For insomnia    [provider]    Allergies Ace inhibitors; Penicillins; and Penicillin v potassium  Family History  Problem Relation Age of Onset  . Bladder Cancer Neg Hx   . Kidney cancer Neg Hx     Social History Social History   Tobacco Use  . Smoking status: Never Smoker  . Smokeless tobacco: Never Used  Substance Use Topics  . Alcohol use: No  . Drug use: No    Review of Systems  Constitutional: No fever/chills Eyes: No visual changes. ENT: No sore throat anymore. Cardiovascular: Denies chest pain. Respiratory: Denies shortness of breath. Gastrointestinal: No abdominal pain.    nausea,   vomiting.  No  diarrhea.  No constipation. Genitourinary: Negative for dysuria. Musculoskeletal: Negative for back pain. Skin: Negative for rash. Neurological: Negative for headaches, focal weakness  ____________________________________________   PHYSICAL EXAM:  VITAL SIGNS: ED Triage Vitals  Enc Vitals Group     BP 08/29/17 1304 (!) 163/76     Pulse Rate 08/29/17 1304 86     Resp 08/29/17 1304 18     Temp 08/29/17 1304 98.8 F (37.1 C)     Temp Source 08/29/17 1304 Oral     SpO2 08/29/17 1304 100 %     Weight 08/29/17 1305 262 lb (118.8 kg)     Height 08/29/17 1305  5\' 3"  (1.6 m)     Head Circumference --      Peak Flow --      Pain Score --      Pain Loc --      Pain Edu? --      Excl. in GC? --     Constitutional: Alert and oriented. Well appearing and in no acute distress. Eyes: Conjunctivae are normal.  Head: Atraumatic. Nose: No congestion/rhinnorhea. Mouth/Throat: Mucous membranes are moist.  Oropharynx non-erythematous. Neck: No stridor.   Cardiovascular: Normal rate, regular rhythm. Grossly normal heart sounds.  Good peripheral circulation. Respiratory: Normal respiratory effort.  No retractions. Lungs CTAB. Gastrointestinal: Soft and nontender. No distention. No abdominal bruits. No CVA tenderness. {Musculoskeletal: No lower extremity tenderness nor edema.  No joint effusions. Neurologic:  Normal speech and language. No gross focal neurologic deficits are appreciated. Skin:  Skin is warm, dry and intact. No rash noted. Psychiatric: Mood and affect are normal. Speech and behavior are normal.  ____________________________________________   LABS (all labs ordered are listed, but only abnormal results are displayed)  Labs Reviewed  BASIC METABOLIC PANEL  CBC  URINALYSIS, COMPLETE (UACMP) WITH MICROSCOPIC  HEPATIC FUNCTION PANEL  LIPASE, BLOOD  TROPONIN I  DIFFERENTIAL  CBG MONITORING, ED   ____________________________________________  EKG  EKG read and interpreted by me shows normal sinus rhythm at a rate of 78 left axis no acute ST-T wave changes there are some longer looks like sinus pauses on the EKG I onl ____________________________________________  RADIOLOGY acute abdominal series is nondiagnostic  ____________________________________________   PROCEDURES  Procedure(s) performed:   Procedures  Critical Care performed:   ____________________________________________   INITIAL IMPRESSION / ASSESSMENT AND PLAN / ED COURSE   Pt signed out     ____________________________________________   FINAL  CLINICAL IMPRESSION(S) / ED DIAGNOSES  Final diagnoses:  None     ED Discharge Orders    None       Note:  This document was prepared using Dragon voice recognition software and may include unintentional dictation errors.    Arnaldo NatalMalinda, Paul F, MD 08/29/17 [phone]

## 2017-08-29 NOTE — ED Notes (Signed)
20 ga right AC IV removed

## 2017-09-16 ENCOUNTER — Encounter: Payer: Self-pay | Admitting: Internal Medicine

## 2017-09-16 ENCOUNTER — Ambulatory Visit (INDEPENDENT_AMBULATORY_CARE_PROVIDER_SITE_OTHER): Payer: Medicare Other | Admitting: Internal Medicine

## 2017-09-16 VITALS — BP 171/66 | HR 58 | Resp 16 | Ht 63.0 in | Wt 258.8 lb

## 2017-09-16 DIAGNOSIS — Z9989 Dependence on other enabling machines and devices: Secondary | ICD-10-CM | POA: Diagnosis not present

## 2017-09-16 DIAGNOSIS — G4733 Obstructive sleep apnea (adult) (pediatric): Secondary | ICD-10-CM | POA: Diagnosis not present

## 2017-09-16 NOTE — Patient Instructions (Signed)

## 2017-09-16 NOTE — Progress Notes (Signed)
[facility] [place],  [address]  Pulmonary Sleep Medicine  Office Visit Note  Patient Name: Danielle [name] DOB: 02/25/1929 MRN [id number]  Date of Service: 09/16/2017     Complaints/HPI: She is doing well overall she has been using her CPAP as ordered. She is due for a download. She states that she would like a new machine as she notes some issues with her current machine no admissions no sinus issues no headache noted. She denies having any issues with herbreathing overall  Current Medication: Outpatient Encounter Medications as of 09/16/2017  Medication Sig  . aspirin EC 81 MG tablet Take 1 tablet by mouth daily.  Marland Kitchen donepezil (ARICEPT) 5 MG tablet Take 5 mg by mouth at bedtime.  . hydrochlorothiazide (HYDRODIURIL) 25 MG tablet Take 25 mg by mouth daily.  Marland Kitchen levothyroxine (SYNTHROID, LEVOTHROID) 150 MCG tablet Take 150 mcg by mouth daily before breakfast.  . metoprolol succinate (TOPROL-XL) 25 MG 24 hr tablet Take 25 mg by mouth daily.   . mirabegron ER (MYRBETRIQ) 25 MG TB24 tablet Take 1 tablet (25 mg total) by mouth daily. (Patient not taking: Reported on 08/29/2017)  . mirabegron ER (MYRBETRIQ) 50 MG TB24 tablet Take 1 tablet (50 mg total) by mouth daily. (Patient not taking: Reported on 08/29/2017)  . nitroGLYCERIN (NITROSTAT) 0.4 MG SL tablet   . ondansetron (ZOFRAN ODT) 4 MG disintegrating tablet Take 1 tablet (4 mg total) by mouth every 8 (eight) hours as needed for nausea or vomiting.  Marland Kitchen oxybutynin (DITROPAN-XL) 10 MG 24 hr tablet   . polyethylene glycol powder (GLYCOLAX/MIRALAX) powder   . potassium chloride (MICRO-K) 10 MEQ CR capsule Take 1 capsule by mouth 2 (two) times daily.  . sertraline (ZOLOFT) 100 MG tablet Take 1 tablet by mouth daily.   No facility-administered encounter medications on file as of 09/16/2017.     Surgical History: Past Surgical History:  Procedure Laterality Date  . ABDOMINAL HYSTERECTOMY    . APPENDECTOMY       Medical History: Past Medical History:  Diagnosis Date  . Anxiety   . Arthritis   . Depression   . Hyperlipemia   . Hypertension   . Hypothyroidism   . Sleep apnea     Family History: Family History  Problem Relation Age of Onset  . Bladder Cancer Neg Hx   . Kidney cancer Neg Hx     Social History: Social History   Socioeconomic History  . Marital status: Widowed    Spouse name: Not on file  . Number of children: Not on file  . Years of education: Not on file  . Highest education level: Not on file  Social Needs  . Financial resource strain: Not on file  . Food insecurity - worry: Not on file  . Food insecurity - inability: Not on file  . Transportation needs - medical: Not on file  . Transportation needs - non-medical: Not on file  Occupational History  . Not on file  Tobacco Use  . Smoking status: Never Smoker  . Smokeless tobacco: Never Used  Substance and Sexual Activity  . Alcohol use: No  . Drug use: No  . Sexual activity: Not on file  Other Topics Concern  . Not on file  Social History Narrative  . Not on file     ROS  General: (-) fever, (-) chills, (-) night sweats, (-) weakness, (-) changes in appetite. Skin: (-) rashes, (-) itching,. Eyes: (-) visual changes, (-)  redness, (-) itching, (-) double or blurred vision. Nose and Sinuses: (-) nasal stuffiness or itchiness, (-) postnasal drip, (-) nosebleeds, (-) sinus trouble. Mouth and Throat: (-) sore throat, (-) hoarseness. Neck: (-) swollen glands, (-) enlarged thyroid, (-) neck pain. Respiratory: (-) cough, (-) bloody sputum, (-) shortness of breath, (-) wheezing. Cardiovascular: (-) ankle swelling, (-) chest pain. Lymphatic: (-) lymph node enlargement, (-) lymph node tenderness. Neurologic: (-) numbness, (-) tingling,(-) dizziness. Psychiatric: (-) anxiety, (-) depression.  Vital Signs: Blood pressure (!) 171/66, pulse (!) 58, resp. rate 16, height '5\' 3"'  (1.6 m), weight 258 lb 12.8 oz  (117.4 kg), SpO2 98 %.  Examination: General Appearance: The patient is well-developed, well-nourished, and in no distress. Skin: Gross inspection of skin demonstrates no evidence of abnormality. Head: Patient's head is normocephalic, no gross deformities. Eyes: no gross deformities noted. ENT: ears appear grossly normal. Nasopharynx appears to be normal. Neck: Supple. No thyromegaly. No LAD. Respiratory: Lungs are clear to auscultation with no adventitious sounds. Cardiovascular: Normal S1 and S2 without murmur or rub. Extremities: No cyanosis. pulses are equal. Neurologic: Alert and oriented. No involuntary movements.  LABS: Recent Results (from the past 2160 hour(s))  Urinalysis, Complete     Status: Abnormal   Collection Time: 07/03/17  1:42 PM  Result Value Ref Range   Specific Gravity, UA 1.020 1.005 - 1.030   pH, UA 6.0 5.0 - 7.5   Color, UA Yellow Yellow   Appearance Ur Clear Clear   Leukocytes, UA Negative Negative   Protein, UA Negative Negative/Trace   Glucose, UA Negative Negative   Ketones, UA Trace (A) Negative   RBC, UA Negative Negative   Bilirubin, UA Negative Negative   Urobilinogen, Ur 1.0 0.2 - 1.0 mg/dL   Nitrite, UA Negative Negative   Microscopic Examination See below:   Microscopic Examination     Status: Abnormal   Collection Time: 07/03/17  1:42 PM  Result Value Ref Range   WBC, UA None seen 0 - 5 /hpf   RBC, UA None seen 0 - 2 /hpf   Epithelial Cells (non renal) 0-10 0 - 10 /hpf   Mucus, UA Present (A) Not Estab.   Bacteria, UA Moderate (A) None seen/Few  BLADDER SCAN AMB NON-IMAGING     Status: None   Collection Time: 07/03/17  1:51 PM  Result Value Ref Range   Scan Result 58m   Hepatic function panel     Status: Abnormal   Collection Time: 08/29/17 12:03 PM  Result Value Ref Range   Total Protein 7.5 6.5 - 8.1 g/dL   Albumin 3.6 3.5 - 5.0 g/dL   AST 29 15 - 41 U/L   ALT 14 14 - 54 U/L   Alkaline Phosphatase 82 38 - 126 U/L   Total  Bilirubin 0.9 0.3 - 1.2 mg/dL   Bilirubin, Direct <0.1 (L) 0.1 - 0.5 mg/dL   Indirect Bilirubin NOT CALCULATED 0.3 - 0.9 mg/dL    Comment: [facility] [address] [place] [address] Lipase, blood     Status: None   Collection Time: 08/29/17 12:03 PM  Result Value Ref Range   Lipase 24 11 - 51 U/L    Comment: [facility] [address], [place] [id number] Troponin I     Status: None   Collection Time: 08/29/17 12:03 PM  Result Value Ref Range   Troponin I <0.03 <0.03 ng/mL    Comment: Performed  at [facility], [address] [place], [place] [address]  Basic metabolic panel     Status: Abnormal   Collection Time: 08/29/17  1:03 PM  Result Value Ref Range   Sodium 131 (L) 135 - 145 mmol/L   Potassium 3.6 3.5 - 5.1 mmol/L   Chloride 102 101 - 111 mmol/L   CO2 20 (L) 22 - 32 mmol/L   Glucose, Bld 188 (H) 65 - 99 mg/dL   BUN 22 (H) 6 - 20 mg/dL   Creatinine, Ser 0.85 0.44 - 1.00 mg/dL   Calcium 9.4 8.9 - 10.3 mg/dL   GFR calc non Af Amer >60 >60 mL/min   GFR calc Af Amer >60 >60 mL/min    Comment: (NOTE) The eGFR has been calculated using the CKD EPI equation. This calculation has not been validated in all clinical situations. eGFR's persistently <60 mL/min signify possible Chronic Kidney Disease.    Anion gap 9 5 - 15    Comment: Performed at [facility], [address] [place], [place] [address]  CBC     Status: Abnormal   Collection Time: 08/29/17  1:03 PM  Result Value Ref Range   WBC 5.0 3.6 - 11.0 K/uL   RBC 4.15 3.80 - 5.20 MIL/uL   Hemoglobin 12.5 12.0 - 16.0 g/dL   HCT 37.6 35.0 - 47.0 %   MCV 90.6 80.0 - 100.0 fL   MCH 30.2 26.0 - 34.0 pg   MCHC 33.3 32.0 - 36.0 g/dL   RDW 20.0 (H) 11.5 - 14.5 %   Platelets 216 150 - 440 K/uL    Comment: Performed at [facility], [address] [place], [place] [address]  Differential     Status: Abnormal   Collection Time:  08/29/17  1:03 PM  Result Value Ref Range   Neutrophils Relative % 87 %   Neutro Abs 4.2 1.4 - 6.5 K/uL   Lymphocytes Relative 8 %   Lymphs Abs 0.4 (L) 1.0 - 3.6 K/uL   Monocytes Relative 5 %   Monocytes Absolute 0.2 0.2 - 0.9 K/uL   Eosinophils Relative 0 %   Eosinophils Absolute 0.0 0 - 0.7 K/uL   Basophils Relative 0 %   Basophils Absolute 0.0 0 - 0.1 K/uL    Comment: Performed at [facility], [address] [place], [place] [address]  Urinalysis, Complete w Microscopic     Status: Abnormal   Collection Time: 08/29/17  3:43 PM  Result Value Ref Range   Color, Urine YELLOW (A) YELLOW   APPearance CLEAR (A) CLEAR   Specific Gravity, Urine 1.023 1.005 - 1.030   pH 7.0 5.0 - 8.0   Glucose, UA NEGATIVE NEGATIVE mg/dL   Hgb urine dipstick NEGATIVE NEGATIVE   Bilirubin Urine NEGATIVE NEGATIVE   Ketones, ur 20 (A) NEGATIVE mg/dL   Protein, ur NEGATIVE NEGATIVE mg/dL   Nitrite NEGATIVE NEGATIVE   Leukocytes, UA NEGATIVE NEGATIVE   RBC / HPF 0-5 0 - 5 RBC/hpf   WBC, UA NONE SEEN 0 - 5 WBC/hpf   Bacteria, UA NONE SEEN NONE SEEN   Squamous Epithelial / LPF 0-5 (A) NONE SEEN   Mucus PRESENT     Comment: Performed at [facility], [address] [place], [place] [address]    Radiology: Ct Abdomen Pelvis W Contrast  Result Date: 08/29/2017 CLINICAL DATA:  Nausea and vomiting EXAM: CT ABDOMEN AND PELVIS WITH CONTRAST TECHNIQUE: Multidetector CT imaging of the abdomen and pelvis  was performed using the standard protocol following bolus administration of intravenous contrast. CONTRAST:  186m ISOVUE-300 IOPAMIDOL (ISOVUE-300) INJECTION 61% COMPARISON:  CT of the chest dated 11/08/2008 FINDINGS: Lower chest: Lung bases demonstrate multiple scattered nodules throughout both lungs. The largest of these lies in the posterior costophrenic angle on the left best seen on image number 19 of series 4. It measures 10 mm in dimension. This is increased from the prior exam at which  time it measured 7 mm. Hepatobiliary: Scattered hypodensities are noted consistent with cysts. The gallbladder is well distended without definitive cholelithiasis. Pancreas: Unremarkable. No pancreatic ductal dilatation or surrounding inflammatory changes. Spleen: Normal in size without focal abnormality. Adrenals/Urinary Tract: The adrenal glands are within normal limits. Renal cystic changes are noted bilaterally no renal calculi or obstructive changes are seen. The bladder is decompressed. Stomach/Bowel: The appendix is not visualized consistent with prior surgical history. No obstructive changes are noted within the bowel. The stomach is well distended with food stuffs but demonstrates some diffuse thickening of the wall likely related to gastritis given the clinical history. Vascular/Lymphatic: Aortic atherosclerosis. No enlarged abdominal or pelvic lymph nodes. Reproductive: Status post hysterectomy. No adnexal masses. Other: No abdominal wall hernia or abnormality. No abdominopelvic ascites. Musculoskeletal: Degenerative changes of the hip joints and lumbar spine are noted. IMPRESSION: Changes consistent with gastritis given the clinical history. No evidence of perforation is noted. Nodules within the lung bases bilaterally largest within the left lower lobe. This has increased from 7-10 mm over the past 8 years. This lesion likely is benign in etiology given the slow rate of growth although follow-up is recommended as described below. Non-contrast chest CT at 3-6 months is recommended. If the nodules are stable at time of repeat CT, then future CT at 18-24 months (from today's scan) is considered optional for low-risk patients, but is recommended for high-risk patients. This recommendation follows the consensus statement: Guidelines for Management of Incidental Pulmonary Nodules Detected on CT Images: From the Fleischner Society 2017; Radiology 2017; 284:228-243. Electronically Signed   By: MInez CatalinaM.D.    On: 08/29/2017 16:53   Dg Abdomen Acute W/chest  Result Date: 08/29/2017 CLINICAL DATA:  Nausea.  Vomiting. EXAM: DG ABDOMEN ACUTE W/ 1V CHEST COMPARISON:  08/09/2012 chest radiograph. FINDINGS: Stable cardiomediastinal silhouette with normal heart size and aortic atherosclerosis. No pneumothorax. No pleural effusion. No pulmonary edema. No acute consolidative airspace disease. Stable biapical capping. Relative paucity of bowel gas throughout the abdomen with no evidence of dilated small bowel loops, pneumatosis or pneumoperitoneum. No radiopaque nephrolithiasis. Marked thoracolumbar spondylosis. IMPRESSION: 1. No active cardiopulmonary disease. 2. Relative paucity of bowel gas with no evidence of dilated bowel loops, noting that dilated fluid-filled bowel loops cannot be excluded by routine radiographs. If there is clinical concern for small bowel obstruction, a CT of the abdomen and pelvis with IV and oral contrast may be performed. Electronically Signed   By: JIlona SorrelM.D.   On: 08/29/2017 14:24    No results found.  Ct Abdomen Pelvis W Contrast  Result Date: 08/29/2017 CLINICAL DATA:  Nausea and vomiting EXAM: CT ABDOMEN AND PELVIS WITH CONTRAST TECHNIQUE: Multidetector CT imaging of the abdomen and pelvis was performed using the standard protocol following bolus administration of intravenous contrast. CONTRAST:  1028mISOVUE-300 IOPAMIDOL (ISOVUE-300) INJECTION 61% COMPARISON:  CT of the chest dated 11/08/2008 FINDINGS: Lower chest: Lung bases demonstrate multiple scattered nodules throughout both lungs. The largest of these lies in the posterior costophrenic angle on  the left best seen on image number 19 of series 4. It measures 10 mm in dimension. This is increased from the prior exam at which time it measured 7 mm. Hepatobiliary: Scattered hypodensities are noted consistent with cysts. The gallbladder is well distended without definitive cholelithiasis. Pancreas: Unremarkable. No  pancreatic ductal dilatation or surrounding inflammatory changes. Spleen: Normal in size without focal abnormality. Adrenals/Urinary Tract: The adrenal glands are within normal limits. Renal cystic changes are noted bilaterally no renal calculi or obstructive changes are seen. The bladder is decompressed. Stomach/Bowel: The appendix is not visualized consistent with prior surgical history. No obstructive changes are noted within the bowel. The stomach is well distended with food stuffs but demonstrates some diffuse thickening of the wall likely related to gastritis given the clinical history. Vascular/Lymphatic: Aortic atherosclerosis. No enlarged abdominal or pelvic lymph nodes. Reproductive: Status post hysterectomy. No adnexal masses. Other: No abdominal wall hernia or abnormality. No abdominopelvic ascites. Musculoskeletal: Degenerative changes of the hip joints and lumbar spine are noted. IMPRESSION: Changes consistent with gastritis given the clinical history. No evidence of perforation is noted. Nodules within the lung bases bilaterally largest within the left lower lobe. This has increased from 7-10 mm over the past 8 years. This lesion likely is benign in etiology given the slow rate of growth although follow-up is recommended as described below. Non-contrast chest CT at 3-6 months is recommended. If the nodules are stable at time of repeat CT, then future CT at 18-24 months (from today's scan) is considered optional for low-risk patients, but is recommended for high-risk patients. This recommendation follows the consensus statement: Guidelines for Management of Incidental Pulmonary Nodules Detected on CT Images: From the Fleischner Society 2017; Radiology 2017; 284:228-243. Electronically Signed   By: Inez Catalina M.D.   On: 08/29/2017 16:53   Dg Abdomen Acute W/chest  Result Date: 08/29/2017 CLINICAL DATA:  Nausea.  Vomiting. EXAM: DG ABDOMEN ACUTE W/ 1V CHEST COMPARISON:  08/09/2012 chest  radiograph. FINDINGS: Stable cardiomediastinal silhouette with normal heart size and aortic atherosclerosis. No pneumothorax. No pleural effusion. No pulmonary edema. No acute consolidative airspace disease. Stable biapical capping. Relative paucity of bowel gas throughout the abdomen with no evidence of dilated small bowel loops, pneumatosis or pneumoperitoneum. No radiopaque nephrolithiasis. Marked thoracolumbar spondylosis. IMPRESSION: 1. No active cardiopulmonary disease. 2. Relative paucity of bowel gas with no evidence of dilated bowel loops, noting that dilated fluid-filled bowel loops cannot be excluded by routine radiographs. If there is clinical concern for small bowel obstruction, a CT of the abdomen and pelvis with IV and oral contrast may be performed. Electronically Signed   By: Ilona Sorrel M.D.   On: 08/29/2017 14:24      Assessment and Plan: Patient Active Problem List   Diagnosis Date Noted  . Urge incontinence of urine 07/03/2017  . Coronary arteriosclerosis in native artery 05/29/2014  . CAD (coronary artery disease), native coronary artery 05/29/2014  . Hyperlipidemia 05/29/2014  . Hypertension 05/29/2014  . Hyperlipidemia, unspecified 05/29/2014  . Pain in extremity 06/21/2013  . Onychomycosis due to dermatophyte 06/21/2013    1. OSA Will cotninue with CPAP on the current pressures Follow up after her download is done  2. Obesity Weight loss and dietary restrictions   General Counseling: I have discussed the findings of the evaluation and examination with Vivyan.  I have also discussed any further diagnostic evaluation thatmay be needed or ordered today. Kalen verbalizes understanding of the findings of todays visit. We also reviewed her  medications today and discussed drug interactions and side effects including but not limited excessive drowsiness and altered mental states. We also discussed that there is always a risk not just to her but also people around her.  she has been encouraged to call the office with any questions or concerns that should arise related to todays visit.    Time spent: 23mn  I have personally obtained a history, examined the patient, evaluated laboratory and imaging results, formulated the assessment and plan and placed orders.    SAllyne Gee MD [facility]Pulmonary [facility] Sleep medicine

## 2017-11-03 ENCOUNTER — Ambulatory Visit (INDEPENDENT_AMBULATORY_CARE_PROVIDER_SITE_OTHER): Payer: Medicare Other | Admitting: Podiatry

## 2017-11-03 ENCOUNTER — Encounter: Payer: Self-pay | Admitting: Podiatry

## 2017-11-03 DIAGNOSIS — M79676 Pain in unspecified toe(s): Secondary | ICD-10-CM

## 2017-11-03 DIAGNOSIS — B351 Tinea unguium: Secondary | ICD-10-CM | POA: Diagnosis not present

## 2017-11-03 DIAGNOSIS — L84 Corns and callosities: Secondary | ICD-10-CM | POA: Diagnosis not present

## 2017-11-03 DIAGNOSIS — M2040 Other hammer toe(s) (acquired), unspecified foot: Secondary | ICD-10-CM

## 2017-11-03 NOTE — Progress Notes (Signed)
Complaint:  Visit Type: Patient returns to my office for continued preventative foot care services. Complaint: Patient states" my nails have grown long and thick and become painful to walk and wear shoes" . The patient presents for preventative foot care services. No changes to ROS  Podiatric Exam: Vascular: dorsalis pedis and posterior tibial pulses are palpable bilateral. Capillary return is immediate. Temperature gradient is WNL. Skin turgor WNL  Sensorium: Normal Semmes Weinstein monofilament test. Normal tactile sensation bilaterally. Nail Exam: Pt has thick disfigured discolored nails with subungual debris noted bilateral entire nail hallux through fifth toenails Ulcer Exam: There is no evidence of ulcer or pre-ulcerative changes or infection. Orthopedic Exam: Muscle tone and strength are WNL. No limitations in general ROM. No crepitus or effusions noted. Foot type and digits show no abnormalities. Bony prominences are unremarkable. HAV  B/L  Hammer toe with heloma durum 4th right Skin: No Porokeratosis. No infection or ulcers  Diagnosis:  Onychomycosis, , Pain in right toe, pain in left toes.  Debride corn fourth toe right foot  Treatment & Plan Procedures and Treatment: Consent by patient was obtained for treatment procedures. The patient understood the discussion of treatment and procedures well. All questions were answered thoroughly reviewed. Debridement of mycotic and hypertrophic toenails, 1 through 5 bilateral and clearing of subungual debris. No ulceration, no infection noted.  Padding applied to corn Return Visit-Office Procedure: Patient instructed to return to the office for a follow up visit 3 months for continued evaluation and treatment.    Helane GuntherGregory Monserat Prestigiacomo DPM

## 2017-11-12 ENCOUNTER — Ambulatory Visit: Payer: Self-pay

## 2017-11-26 ENCOUNTER — Ambulatory Visit: Payer: Medicare Other

## 2017-12-04 ENCOUNTER — Telehealth: Payer: Self-pay | Admitting: Internal Medicine

## 2017-12-04 NOTE — Telephone Encounter (Signed)
Gave [facility] papers and RX for new cpap set up. Will discuss with Tresa EndoKelly if pt needs in office or home setup/ [address]

## 2018-01-13 ENCOUNTER — Other Ambulatory Visit: Payer: Self-pay | Admitting: Family Medicine

## 2018-01-13 DIAGNOSIS — Z1231 Encounter for screening mammogram for malignant neoplasm of breast: Secondary | ICD-10-CM

## 2018-01-20 ENCOUNTER — Telehealth: Payer: Self-pay | Admitting: Internal Medicine

## 2018-01-20 NOTE — Telephone Encounter (Signed)
Patient called and states she uses [facility] for DME, I faxed new RX and cpap set up information to [facility] per pt request. Danielle [name]

## 2018-01-30 ENCOUNTER — Ambulatory Visit
Admission: RE | Admit: 2018-01-30 | Discharge: 2018-01-30 | Disposition: A | Discharge status: Home / Self Care | Payer: Medicare Other | Source: Ambulatory Visit | Attending: Family Medicine | Admitting: Family Medicine

## 2018-01-30 DIAGNOSIS — Z1231 Encounter for screening mammogram for malignant neoplasm of breast: Secondary | ICD-10-CM | POA: Diagnosis present

## 2018-02-04 ENCOUNTER — Other Ambulatory Visit: Payer: Self-pay | Admitting: Family Medicine

## 2018-02-04 DIAGNOSIS — R928 Other abnormal and inconclusive findings on diagnostic imaging of breast: Secondary | ICD-10-CM

## 2018-02-04 DIAGNOSIS — N6489 Other specified disorders of breast: Secondary | ICD-10-CM

## 2018-02-05 ENCOUNTER — Encounter: Payer: Self-pay | Admitting: Podiatry

## 2018-02-05 ENCOUNTER — Ambulatory Visit (INDEPENDENT_AMBULATORY_CARE_PROVIDER_SITE_OTHER): Payer: Medicare Other | Admitting: Podiatry

## 2018-02-05 DIAGNOSIS — M79676 Pain in unspecified toe(s): Secondary | ICD-10-CM | POA: Diagnosis not present

## 2018-02-05 DIAGNOSIS — B351 Tinea unguium: Secondary | ICD-10-CM | POA: Diagnosis not present

## 2018-02-05 NOTE — Progress Notes (Signed)
Complaint:  Visit Type: Patient returns to my office for continued preventative foot care services. Complaint: Patient states" my nails have grown long and thick and become painful to walk and wear shoes" . The patient presents for preventative foot care services. No changes to ROS  Podiatric Exam: Vascular: dorsalis pedis and posterior tibial pulses are palpable bilateral. Capillary return is immediate. Temperature gradient is WNL. Skin turgor WNL  Sensorium: Normal Semmes Weinstein monofilament test. Normal tactile sensation bilaterally. Nail Exam: Pt has thick disfigured discolored nails with subungual debris noted bilateral entire nail hallux through fifth toenails Ulcer Exam: There is no evidence of ulcer or pre-ulcerative changes or infection. Orthopedic Exam: Muscle tone and strength are WNL. No limitations in general ROM. No crepitus or effusions noted. Foot type and digits show no abnormalities. Bony prominences are unremarkable. HAV  B/L  Hammer toe  Skin: No Porokeratosis. No infection or ulcers  Diagnosis:  Onychomycosis, , Pain in right toe, pain in left toes.    Treatment & Plan Procedures and Treatment: Consent by patient was obtained for treatment procedures. The patient understood the discussion of treatment and procedures well. All questions were answered thoroughly reviewed. Debridement of mycotic and hypertrophic toenails, 1 through 5 bilateral and clearing of subungual debris. No ulceration, no infection noted.   Return Visit-Office Procedure: Patient instructed to return to the office for a follow up visit 3 months for continued evaluation and treatment.    Parish Dubose DPM 

## 2018-02-11 ENCOUNTER — Ambulatory Visit
Admission: RE | Admit: 2018-02-11 | Discharge: 2018-02-11 | Disposition: A | Discharge status: Home / Self Care | Payer: Medicare Other | Source: Ambulatory Visit | Attending: Family Medicine | Admitting: Family Medicine

## 2018-02-11 ENCOUNTER — Other Ambulatory Visit: Payer: Self-pay | Admitting: Family Medicine

## 2018-02-11 DIAGNOSIS — N6489 Other specified disorders of breast: Secondary | ICD-10-CM | POA: Insufficient documentation

## 2018-02-11 DIAGNOSIS — R928 Other abnormal and inconclusive findings on diagnostic imaging of breast: Secondary | ICD-10-CM | POA: Diagnosis present

## 2018-02-12 ENCOUNTER — Other Ambulatory Visit: Payer: Self-pay | Admitting: Family Medicine

## 2018-03-17 ENCOUNTER — Ambulatory Visit: Payer: Self-pay | Admitting: Internal Medicine

## 2018-04-09 ENCOUNTER — Other Ambulatory Visit: Payer: Self-pay | Admitting: Family Medicine

## 2018-04-09 DIAGNOSIS — M8580 Other specified disorders of bone density and structure, unspecified site: Secondary | ICD-10-CM

## 2018-04-14 ENCOUNTER — Encounter: Payer: Self-pay | Admitting: Internal Medicine

## 2018-04-14 ENCOUNTER — Ambulatory Visit (INDEPENDENT_AMBULATORY_CARE_PROVIDER_SITE_OTHER): Payer: Medicare Other | Admitting: Internal Medicine

## 2018-04-14 VITALS — BP 132/68 | HR 56 | Resp 16 | Ht 63.0 in | Wt 246.0 lb

## 2018-04-14 DIAGNOSIS — G4733 Obstructive sleep apnea (adult) (pediatric): Secondary | ICD-10-CM

## 2018-04-14 DIAGNOSIS — I2510 Atherosclerotic heart disease of native coronary artery without angina pectoris: Secondary | ICD-10-CM | POA: Diagnosis not present

## 2018-04-14 DIAGNOSIS — Z9989 Dependence on other enabling machines and devices: Secondary | ICD-10-CM

## 2018-04-14 DIAGNOSIS — I2583 Coronary atherosclerosis due to lipid rich plaque: Secondary | ICD-10-CM

## 2018-04-14 NOTE — Patient Instructions (Signed)

## 2018-04-14 NOTE — Progress Notes (Signed)
[facility] [address] [place], [place] [address]  Pulmonary Sleep Medicine   Office Visit Note  Patient Name: Danielle [name] DOB: 04/16/1929 MRN [id number]  Date of Service: 04/14/2018  Complaints/HPI:  She is doing fairly well states that she has been using her CPAP device regularly.  She actually got a new machine which has made her breathing a lot better.  She states it is less weans also.  No sinus troubles no headaches no dizziness.  She denies having any cough or congestion.  States that she is getting full night sleep and she notes no snoring.  ROS  General: (-) fever, (-) chills, (-) night sweats, (-) weakness Skin: (-) rashes, (-) itching,. Eyes: (-) visual changes, (-) redness, (-) itching. Nose and Sinuses: (-) nasal stuffiness or itchiness, (-) postnasal drip, (-) nosebleeds, (-) sinus trouble. Mouth and Throat: (-) sore throat, (-) hoarseness. Neck: (-) swollen glands, (-) enlarged thyroid, (-) neck pain. Respiratory: - cough, (-) bloody sputum, - shortness of breath, - wheezing. Cardiovascular: - ankle swelling, (-) chest pain. Lymphatic: (-) lymph node enlargement. Neurologic: (-) numbness, (-) tingling. Psychiatric: (-) anxiety, (-) depression   Current Medication: Outpatient Encounter Medications as of 04/14/2018  Medication Sig  . aspirin EC 81 MG tablet Take 1 tablet by mouth daily.  Marland Kitchen donepezil (ARICEPT) 5 MG tablet Take 5 mg by mouth at bedtime.  . hydrochlorothiazide (HYDRODIURIL) 25 MG tablet Take 25 mg by mouth daily.  Marland Kitchen levothyroxine (SYNTHROID, LEVOTHROID) 150 MCG tablet Take 150 mcg by mouth daily before breakfast.  . metoprolol succinate (TOPROL-XL) 25 MG 24 hr tablet Take 25 mg by mouth daily.   . nitroGLYCERIN (NITROSTAT) 0.4 MG SL tablet   . ondansetron (ZOFRAN ODT) 4 MG disintegrating tablet Take 1 tablet (4 mg total) by mouth every 8 (eight) hours as needed for nausea or vomiting.  Marland Kitchen oxybutynin (DITROPAN-XL) 10 MG 24 hr tablet    . polyethylene glycol powder (GLYCOLAX/MIRALAX) powder   . potassium chloride (MICRO-K) 10 MEQ CR capsule Take 1 capsule by mouth 2 (two) times daily.  . sertraline (ZOLOFT) 100 MG tablet Take 1 tablet by mouth daily.  . [DISCONTINUED] mirabegron ER (MYRBETRIQ) 25 MG TB24 tablet Take 1 tablet (25 mg total) by mouth daily. (Patient not taking: Reported on 04/14/2018)  . [DISCONTINUED] mirabegron ER (MYRBETRIQ) 50 MG TB24 tablet Take 1 tablet (50 mg total) by mouth daily. (Patient not taking: Reported on 04/14/2018)   No facility-administered encounter medications on file as of 04/14/2018.     Surgical History: Past Surgical History:  Procedure Laterality Date  . ABDOMINAL HYSTERECTOMY    . APPENDECTOMY      Medical History: Past Medical History:  Diagnosis Date  . Anxiety   . Arthritis   . Depression   . Hyperlipemia   . Hypertension   . Hypothyroidism   . Sleep apnea     Family History: Family History  Problem Relation Age of Onset  . Bladder Cancer Neg Hx   . Kidney cancer Neg Hx   . Breast cancer Neg Hx     Social History: Social History   Socioeconomic History  . Marital status: Widowed    Spouse name: Not on file  . Number of children: Not on file  . Years of education: Not on file  . Highest education level: Not on file  Occupational History  . Not on file  Social Needs  . Financial resource strain: Not on file  . Food  insecurity:    Worry: Not on file    Inability: Not on file  . Transportation needs:    Medical: Not on file    Non-medical: Not on file  Tobacco Use  . Smoking status: Never Smoker  . Smokeless tobacco: Never Used  Substance and Sexual Activity  . Alcohol use: No  . Drug use: No  . Sexual activity: Not on file  Lifestyle  . Physical activity:    Days per week: Not on file    Minutes per session: Not on file  . Stress: Not on file  Relationships  . Social connections:    Talks on phone: Not on file    Gets together: Not on file     Attends religious service: Not on file    Active member of club or organization: Not on file    Attends meetings of clubs or organizations: Not on file    Relationship status: Not on file  . Intimate partner violence:    Fear of current or ex partner: Not on file    Emotionally abused: Not on file    Physically abused: Not on file    Forced sexual activity: Not on file  Other Topics Concern  . Not on file  Social History Narrative  . Not on file    Vital Signs: Blood pressure 132/68, pulse (!) 56, resp. rate 16, height 5\' 3"  (1.6 m), weight 246 lb (111.6 kg), SpO2 97 %.  Examination: General Appearance: The patient is well-developed, well-nourished, and in no distress. Skin: Gross inspection of skin unremarkable. Head: normocephalic, no gross deformities. Eyes: no gross deformities noted. ENT: ears appear grossly normal no exudates. Neck: Supple. No thyromegaly. No LAD. Respiratory: no rhonchi noted. Cardiovascular: Normal S1 and S2 without murmur or rub. Extremities: No cyanosis. pulses are equal. Neurologic: Alert and oriented. No involuntary movements.  LABS: No results found for this or any previous visit (from the past 2160 hour(s)).  Radiology: [facility] Uni Left Inc Axilla  Result Date: 02/11/2018 CLINICAL DATA:  Patient presents for additional views of the left breast as follow-up to recent screening exam to evaluate a possible asymmetry over the left breast. EXAM: DIGITAL DIAGNOSTIC left MAMMOGRAM WITH TOMO ULTRASOUND left BREAST COMPARISON:  Previous exam(s). ACR Breast Density Category b: There are scattered areas of fibroglandular density. FINDINGS: Spot compression tomographic images demonstrate no focal abnormality over the inner lower left breast. On physical exam, I visualize no focal abnormality over the skin of the inner lower left breast. Targeted ultrasound is performed, showing no focal abnormality over the inner lower left breast. IMPRESSION: No focal  abnormality over the inner lower left breast. RECOMMENDATION: Recommend continued annual bilateral screening mammographic follow-up. I have discussed the findings and recommendations with the patient. Results were also provided in writing at the conclusion of the visit. If applicable, a reminder letter will be sent to the patient regarding the next appointment. BI-RADS CATEGORY  1: Negative. Electronically Signed   By: Elberta Fortisaniel  Boyle M.D.   On: 02/11/2018 16:43   Mm Diag Breast Tomo Uni Left  Result Date: 02/11/2018 CLINICAL DATA:  Patient presents for additional views of the left breast as follow-up to recent screening exam to evaluate a possible asymmetry over the left breast. EXAM: DIGITAL DIAGNOSTIC left MAMMOGRAM WITH TOMO ULTRASOUND left BREAST COMPARISON:  Previous exam(s). ACR Breast Density Category b: There are scattered areas of fibroglandular density. FINDINGS: Spot compression tomographic images demonstrate no focal abnormality over the inner  lower left breast. On physical exam, I visualize no focal abnormality over the skin of the inner lower left breast. Targeted ultrasound is performed, showing no focal abnormality over the inner lower left breast. IMPRESSION: No focal abnormality over the inner lower left breast. RECOMMENDATION: Recommend continued annual bilateral screening mammographic follow-up. I have discussed the findings and recommendations with the patient. Results were also provided in writing at the conclusion of the visit. If applicable, a reminder letter will be sent to the patient regarding the next appointment. BI-RADS CATEGORY  1: Negative. Electronically Signed   By: Elberta Fortisaniel  Boyle M.D.   On: 02/11/2018 16:43    No results found.  No results found.    Assessment and Plan: Patient Active Problem List   Diagnosis Date Noted  . Urge incontinence of urine 07/03/2017  . Coronary arteriosclerosis in native artery 05/29/2014  . CAD (coronary artery disease), native coronary  artery 05/29/2014  . Hyperlipidemia 05/29/2014  . Hypertension 05/29/2014  . Hyperlipidemia, unspecified 05/29/2014  . Pain in extremity 06/21/2013  . Onychomycosis due to dermatophyte 06/21/2013    1. OSA  Doing well we will continue with CPAP she will continue with the same pressure as prescribed.  She will continue to follow up with her DME provider for equipment and supplies 2. CAD stable no active chest pain she will follow-up with her primary care and Cardiology 3. Morbid obesity once again discussed weight loss she is going to try to work on that she does on occasion veer from her diet  General Counseling: I have discussed the findings of the evaluation and examination with Nydia.  I have also discussed any further diagnostic evaluation thatmay be needed or ordered today. Marshe verbalizes understanding of the findings of todays visit. We also reviewed her medications today and discussed drug interactions and side effects including but not limited excessive drowsiness and altered mental states. We also discussed that there is always a risk not just to her but also people around her. she has been encouraged to call the office with any questions or concerns that should arise related to todays visit.    Time spent: 20min  I have personally obtained a history, examined the patient, evaluated laboratory and imaging results, formulated the assessment and plan and placed orders.    Yevonne PaxSaadat A Viha Kriegel, MD [facility] Pulmonary and Critical Care Sleep medicine

## 2018-05-06 ENCOUNTER — Ambulatory Visit
Admission: RE | Admit: 2018-05-06 | Discharge: 2018-05-06 | Disposition: A | Discharge status: Home / Self Care | Payer: Medicare Other | Source: Ambulatory Visit | Attending: Family Medicine | Admitting: Family Medicine

## 2018-05-06 DIAGNOSIS — M8580 Other specified disorders of bone density and structure, unspecified site: Secondary | ICD-10-CM | POA: Diagnosis present

## 2018-05-06 DIAGNOSIS — Z79890 Hormone replacement therapy (postmenopausal): Secondary | ICD-10-CM | POA: Diagnosis not present

## 2018-05-06 DIAGNOSIS — Z13820 Encounter for screening for osteoporosis: Secondary | ICD-10-CM | POA: Diagnosis not present

## 2018-05-06 DIAGNOSIS — Z780 Asymptomatic menopausal state: Secondary | ICD-10-CM | POA: Diagnosis not present

## 2018-05-11 ENCOUNTER — Encounter: Payer: Self-pay | Admitting: Podiatry

## 2018-05-11 ENCOUNTER — Ambulatory Visit (INDEPENDENT_AMBULATORY_CARE_PROVIDER_SITE_OTHER): Payer: Medicare Other | Admitting: Podiatry

## 2018-05-11 DIAGNOSIS — B351 Tinea unguium: Secondary | ICD-10-CM

## 2018-05-11 DIAGNOSIS — M79676 Pain in unspecified toe(s): Secondary | ICD-10-CM

## 2018-05-11 DIAGNOSIS — M2040 Other hammer toe(s) (acquired), unspecified foot: Secondary | ICD-10-CM

## 2018-05-11 NOTE — Progress Notes (Signed)
Complaint:  Visit Type: Patient returns to my office for continued preventative foot care services. Complaint: Patient states" my nails have grown long and thick and become painful to walk and wear shoes" . The patient presents for preventative foot care services. No changes to ROS  Podiatric Exam: Vascular: dorsalis pedis and posterior tibial pulses are palpable bilateral. Capillary return is immediate. Temperature gradient is WNL. Skin turgor WNL  Sensorium: Normal Semmes Weinstein monofilament test. Normal tactile sensation bilaterally. Nail Exam: Pt has thick disfigured discolored nails with subungual debris noted bilateral entire nail hallux through fifth toenails Ulcer Exam: There is no evidence of ulcer or pre-ulcerative changes or infection. Orthopedic Exam: Muscle tone and strength are WNL. No limitations in general ROM. No crepitus or effusions noted. Foot type and digits show no abnormalities. Bony prominences are unremarkable. HAV  B/L  Hammer toe  Skin: No Porokeratosis. No infection or ulcers  Diagnosis:  Onychomycosis, , Pain in right toe, pain in left toes.    Treatment & Plan Procedures and Treatment: Consent by patient was obtained for treatment procedures. The patient understood the discussion of treatment and procedures well. All questions were answered thoroughly reviewed. Debridement of mycotic and hypertrophic toenails, 1 through 5 bilateral and clearing of subungual debris. No ulceration, no infection noted.   Return Visit-Office Procedure: Patient instructed to return to the office for a follow up visit 3 months for continued evaluation and treatment.    Helane Gunther DPM

## 2018-08-10 ENCOUNTER — Encounter: Payer: Self-pay | Admitting: Podiatry

## 2018-08-10 ENCOUNTER — Ambulatory Visit (INDEPENDENT_AMBULATORY_CARE_PROVIDER_SITE_OTHER): Payer: Medicare Other | Admitting: Podiatry

## 2018-08-10 DIAGNOSIS — B351 Tinea unguium: Secondary | ICD-10-CM

## 2018-08-10 DIAGNOSIS — M2010 Hallux valgus (acquired), unspecified foot: Secondary | ICD-10-CM

## 2018-08-10 DIAGNOSIS — M2040 Other hammer toe(s) (acquired), unspecified foot: Secondary | ICD-10-CM | POA: Diagnosis not present

## 2018-08-10 DIAGNOSIS — M79676 Pain in unspecified toe(s): Secondary | ICD-10-CM

## 2018-08-10 NOTE — Progress Notes (Signed)
Complaint:  Visit Type: Patient returns to my office for continued preventative foot care services. Complaint: Patient states" my nails have grown long and thick and become painful to walk and wear shoes" . The patient presents for preventative foot care services. No changes to ROS  Podiatric Exam: Vascular: dorsalis pedis and posterior tibial pulses are palpable bilateral. Capillary return is immediate. Temperature gradient is WNL. Skin turgor WNL  Sensorium: Normal Semmes Weinstein monofilament test. Normal tactile sensation bilaterally. Nail Exam: Pt has thick disfigured discolored nails with subungual debris noted bilateral entire nail hallux through fifth toenails Ulcer Exam: There is no evidence of ulcer or pre-ulcerative changes or infection. Orthopedic Exam: Muscle tone and strength are WNL. No limitations in general ROM. No crepitus or effusions noted. Foot type and digits show no abnormalities. Bony prominences are unremarkable. HAV  B/L  Hammer toe  Skin: No Porokeratosis. No infection or ulcers  Diagnosis:  Onychomycosis, , Pain in right toe, pain in left toes.    Treatment & Plan Procedures and Treatment: Consent by patient was obtained for treatment procedures. The patient understood the discussion of treatment and procedures well. All questions were answered thoroughly reviewed. Debridement of mycotic and hypertrophic toenails, 1 through 5 bilateral and clearing of subungual debris. No ulceration, no infection noted.  Padding was dispensed for hammer toe right. Return Visit-Office Procedure: Patient instructed to return to the office for a follow up visit 3 months for continued evaluation and treatment.    Helane GuntherGregory Armonte Tortorella DPM

## 2018-10-06 ENCOUNTER — Ambulatory Visit (INDEPENDENT_AMBULATORY_CARE_PROVIDER_SITE_OTHER): Payer: Medicare Other | Admitting: Internal Medicine

## 2018-10-06 ENCOUNTER — Encounter (INDEPENDENT_AMBULATORY_CARE_PROVIDER_SITE_OTHER): Payer: Self-pay

## 2018-10-06 ENCOUNTER — Encounter: Payer: Self-pay | Admitting: Internal Medicine

## 2018-10-06 VITALS — BP 148/80 | HR 63 | Resp 16 | Ht 63.0 in | Wt 257.0 lb

## 2018-10-06 DIAGNOSIS — I10 Essential (primary) hypertension: Secondary | ICD-10-CM

## 2018-10-06 DIAGNOSIS — Z9989 Dependence on other enabling machines and devices: Secondary | ICD-10-CM

## 2018-10-06 DIAGNOSIS — I2510 Atherosclerotic heart disease of native coronary artery without angina pectoris: Secondary | ICD-10-CM | POA: Diagnosis not present

## 2018-10-06 DIAGNOSIS — G4733 Obstructive sleep apnea (adult) (pediatric): Secondary | ICD-10-CM

## 2018-10-06 NOTE — Patient Instructions (Signed)

## 2018-10-06 NOTE — Progress Notes (Signed)
[facility] [address] [place], [place] [address]  Pulmonary Sleep Medicine   Office Visit Note  Patient Name: Danielle [name] DOB: [birth year] MRN [id number]  Date of Service: 10/20/2018  Complaints/HPI: Pt here for follow up on osa.  Patient continues to do well using her CPAP device regularly.  She denies any sinus troubles, no headaches, no dizziness.  She also denies any cough or congestion.  She has excellent symptom management and reports that she is getting a full night sleep and is not snoring.  ROS  General: (-) fever, (-) chills, (-) night sweats, (-) weakness Skin: (-) rashes, (-) itching,. Eyes: (-) visual changes, (-) redness, (-) itching. Nose and Sinuses: (-) nasal stuffiness or itchiness, (-) postnasal drip, (-) nosebleeds, (-) sinus trouble. Mouth and Throat: (-) sore throat, (-) hoarseness. Neck: (-) swollen glands, (-) enlarged thyroid, (-) neck pain. Respiratory: - cough, (-) bloody sputum, - shortness of breath, - wheezing. Cardiovascular: - ankle swelling, (-) chest pain. Lymphatic: (-) lymph node enlargement. Neurologic: (-) numbness, (-) tingling. Psychiatric: (-) anxiety, (-) depression   Current Medication: Outpatient Encounter Medications as of 10/06/2018  Medication Sig  . acetaminophen (TYLENOL) 500 MG tablet Take by mouth.  Marland Kitchen alum & mag hydroxide-simeth (MAG-AL PLUS XS) 400-400-40 MG/5ML suspension Take by mouth.  Marland Kitchen aspirin EC 81 MG tablet Take 1 tablet by mouth daily.  . clobetasol ointment (TEMOVATE) 0.05 % Apply twice daily to involved areas  . donepezil (ARICEPT) 5 MG tablet Take 5 mg by mouth at bedtime.  . hydrochlorothiazide (HYDRODIURIL) 25 MG tablet Take 25 mg by mouth daily.  Marland Kitchen levothyroxine (SYNTHROID, LEVOTHROID) 175 MCG tablet   . metoprolol succinate (TOPROL-XL) 25 MG 24 hr tablet Take by mouth.  . naproxen (NAPROSYN) 500 MG tablet Take by mouth.  . nitroGLYCERIN (NITROSTAT) 0.4 MG SL tablet   . ondansetron (ZOFRAN  ODT) 4 MG disintegrating tablet Take 1 tablet (4 mg total) by mouth every 8 (eight) hours as needed for nausea or vomiting.  Marland Kitchen oxybutynin (DITROPAN-XL) 10 MG 24 hr tablet   . polyethylene glycol powder (GLYCOLAX/MIRALAX) powder   . potassium chloride (K-DUR,KLOR-CON) 10 MEQ tablet   . potassium chloride (MICRO-K) 10 MEQ CR capsule Take 1 capsule by mouth 2 (two) times daily.  . sertraline (ZOLOFT) 100 MG tablet Take 1 tablet by mouth daily.  Marland Kitchen triamcinolone cream (KENALOG) 0.1 %    No facility-administered encounter medications on file as of 10/06/2018.     Surgical History: Past Surgical History:  Procedure Laterality Date  . ABDOMINAL HYSTERECTOMY    . APPENDECTOMY      Medical History: Past Medical History:  Diagnosis Date  . Anxiety   . Arthritis   . Depression   . Hyperlipemia   . Hypertension   . Hypothyroidism   . Sleep apnea     Family History: Family History  Problem Relation Age of Onset  . Bladder Cancer Neg Hx   . Kidney cancer Neg Hx   . Breast cancer Neg Hx     Social History: Social History   Socioeconomic History  . Marital status: Widowed    Spouse name: Not on file  . Number of children: Not on file  . Years of education: Not on file  . Highest education level: Not on file  Occupational History  . Not on file  Social Needs  . Financial resource strain: Not on file  . Food insecurity:    Worry: Not on file  Inability: Not on file  . Transportation needs:    Medical: Not on file    Non-medical: Not on file  Tobacco Use  . Smoking status: Never Smoker  . Smokeless tobacco: Never Used  Substance and Sexual Activity  . Alcohol use: No  . Drug use: No  . Sexual activity: Not on file  Lifestyle  . Physical activity:    Days per week: Not on file    Minutes per session: Not on file  . Stress: Not on file  Relationships  . Social connections:    Talks on phone: Not on file    Gets together: Not on file    Attends religious service: Not  on file    Active member of club or organization: Not on file    Attends meetings of clubs or organizations: Not on file    Relationship status: Not on file  . Intimate partner violence:    Fear of current or ex partner: Not on file    Emotionally abused: Not on file    Physically abused: Not on file    Forced sexual activity: Not on file  Other Topics Concern  . Not on file  Social History Narrative  . Not on file    Vital Signs: Blood pressure (!) 148/80, pulse 63, resp. rate 16, height 5\' 3"  (1.6 m), weight 257 lb (116.6 kg), SpO2 99 %.  Examination: General Appearance: The patient is well-developed, well-nourished, and in no distress. Skin: Gross inspection of skin unremarkable. Head: normocephalic, no gross deformities. Eyes: no gross deformities noted. ENT: ears appear grossly normal no exudates. Neck: Supple. No thyromegaly. No LAD. Respiratory: clear bilaterally. Cardiovascular: Normal S1 and S2 without murmur or rub. Extremities: No cyanosis. pulses are equal. Neurologic: Alert and oriented. No involuntary movements.  LABS: No results found for this or any previous visit (from the past 2160 hour(s)).  Radiology: Dg Bone Density (dxa)  Result Date: 05/06/2018 EXAM: DUAL X-RAY ABSORPTIOMETRY (DXA) FOR BONE MINERAL DENSITY IMPRESSION: Dear Dr. Allena Katz, Your patient Danielle [name] completed a BMD test on 05/06/2018 using the Lunar iDXA DXA System (analysis version: 14.10) manufactured by [employer]. The following summarizes the results of our evaluation. PATIENT BIOGRAPHICAL: Name: Danielle [name] Patient ID: [id number] Birth Date: August 05, 1929 Height: 62.0 in. Gender: Female Exam Date: 05/06/2018 Weight: 242.5 lbs. Indications: Advanced Age, Hypothyroid, Hysterectomy, Postmenopausal Fractures: Treatments: ASPRIN 81 MG, LEVOTHYROXINE ASSESSMENT: The BMD measured at Femur Neck Left is 0.913 g/cm2 with a T-score of -0.9. This patient is considered normal according to World  Health Organization [facility]) criteria. Site Region Measured Measured WHO Young Adult BMD Date       Age      Classification T-score AP Spine L1-L4 05/06/2018 87.4 Normal 0.8 1.292 g/cm2 AP Spine L1-L4 11/21/2015 84.9 Normal 0.6 1.272 g/cm2 AP Spine L1-L4 06/10/2012 81.5 Normal 0.7 1.279 g/cm2 AP Spine L1-L4 01/10/2010 79.0 Normal -0.1 1.187 g/cm2 DualFemur Neck Left 05/06/2018 87.4 Normal -0.9 0.913 g/cm2 DualFemur Neck Left 11/21/2015 84.9 Osteopenia -1.4 0.848 g/cm2 DualFemur Neck Left 06/10/2012 81.5 Normal -0.7 0.946 g/cm2 DualFemur Neck Left 01/10/2010 79.0 Normal -0.9 0.908 g/cm2 DualFemur Total Mean 05/06/2018 87.4 Normal -0.4 0.961 g/cm2 DualFemur Total Mean 11/21/2015 84.9 Normal -0.5 0.944 g/cm2 DualFemur Total Mean 06/10/2012 81.5 Normal -0.1 0.995 g/cm2 DualFemur Total Mean 01/10/2010 79.0 Normal -0.3 0.971 g/cm2 World Health Organization [facility]) criteria for post-menopausal, Caucasian Women: Normal:       T-score at or above -1 SD Osteopenia:   T-score between -  1 and -2.5 SD Osteoporosis: T-score at or below -2.5 SD RECOMMENDATIONS: 1. All patients should optimize calcium and vitamin D intake. 2. Consider FDA-approved medical therapies in postmenopausal women and men aged 83 years and older, based on the following: a. A hip or vertebral(clinical or morphometric) fracture b. T-score < -2.5 at the femoral neck or spine after appropriate evaluation to exclude secondary causes c. Low bone mass (T-score between -1.0 and -2.5 at the femoral neck or spine) and a 10-year probability of a hip fracture > 3% or a 10-year probability of a major osteoporosis-related fracture > 20% based on the [place]-adapted WHO algorithm d. Clinician judgment and/or patient preferences may indicate treatment for people with 10-year fracture probabilities above or below these levels FOLLOW-UP: People with diagnosed cases of osteoporosis or at high risk for fracture should have regular bone mineral density tests. For patients eligible for  Medicare, routine testing is allowed once every 2 years. The testing frequency can be increased to one year for patients who have rapidly progressing disease, those who are receiving or discontinuing medical therapy to restore bone mass, or have additional risk factors. I have reviewed this report, and agree with the above findings. [facility] Electronically Signed   By: Bretta BangWilliam  Woodruff III M.D.   On: 05/06/2018 14:47    No results found.  No results found.    Assessment and Plan: Patient Active Problem List   Diagnosis Date Noted  . Urge incontinence of urine 07/03/2017  . Coronary arteriosclerosis in native artery 05/29/2014  . CAD (coronary artery disease), native coronary artery 05/29/2014  . Hyperlipidemia 05/29/2014  . Hypertension 05/29/2014  . Hyperlipidemia, unspecified 05/29/2014  . Pain in extremity 06/21/2013  . Onychomycosis due to dermatophyte 06/21/2013   1. OSA on CPAP Patient continue to use CPAP device as prescribed.  2. Hypertension, unspecified type Stable, continue current medications.   3. Coronary artery disease involving native coronary artery of native heart without angina pectoris Stable, followed by DR. Fath.   4. Morbid obesity (HCC) Obesity Counseling: Risk Assessment: An assessment of behavioral risk factors was made today and includes lack of exercise sedentary lifestyle, lack of portion control and poor dietary habits.  Risk Modification Advice: She was counseled on portion control guidelines. Restricting daily caloric intake to. . The detrimental long term effects of obesity on her health and ongoing poor compliance was also discussed with the patient.     General Counseling: I have discussed the findings of the evaluation and examination with Danielle [name].  I have also discussed any further diagnostic evaluation thatmay be needed or ordered today. Saffron verbalizes understanding of the findings of todays visit. We also reviewed her  medications today and discussed drug interactions and side effects including but not limited excessive drowsiness and altered mental states. We also discussed that there is always a risk not just to her but also people around her. she has been encouraged to call the office with any questions or concerns that should arise related to todays visit.    Time spent: 25  This patient was seen by Blima LedgerAdam Jarman Litton AGNP-C in Collaboration with Dr. Freda MunroSaadat Khan as a part of collaborative care agreement.  I have personally obtained a history, examined the patient, evaluated laboratory and imaging results, formulated the assessment and plan and placed orders.    Yevonne PaxSaadat A Khan, MD [facility] Pulmonary and Critical Care Sleep medicine

## 2018-10-13 ENCOUNTER — Ambulatory Visit: Payer: Self-pay | Admitting: Internal Medicine

## 2018-11-09 ENCOUNTER — Encounter: Payer: Self-pay | Admitting: Podiatry

## 2018-11-09 ENCOUNTER — Ambulatory Visit (INDEPENDENT_AMBULATORY_CARE_PROVIDER_SITE_OTHER): Payer: Medicare Other | Admitting: Podiatry

## 2018-11-09 DIAGNOSIS — M79676 Pain in unspecified toe(s): Secondary | ICD-10-CM | POA: Diagnosis not present

## 2018-11-09 DIAGNOSIS — M2040 Other hammer toe(s) (acquired), unspecified foot: Secondary | ICD-10-CM | POA: Diagnosis not present

## 2018-11-09 DIAGNOSIS — B351 Tinea unguium: Secondary | ICD-10-CM

## 2018-11-09 NOTE — Progress Notes (Signed)
Complaint:  Visit Type: Patient returns to my office for continued preventative foot care services. Complaint: Patient states" my nails have grown long and thick and become painful to walk and wear shoes" . The patient presents for preventative foot care services. No changes to ROS.  Patient has applied freezone to her corn fourth toe right foot which is healing.  Podiatric Exam: Vascular: dorsalis pedis and posterior tibial pulses are palpable bilateral. Capillary return is immediate. Temperature gradient is WNL. Skin turgor WNL  Sensorium: Normal Semmes Weinstein monofilament test. Normal tactile sensation bilaterally. Nail Exam: Pt has thick disfigured discolored nails with subungual debris noted bilateral entire nail hallux through fifth toenails Ulcer Exam: There is no evidence of ulcer or pre-ulcerative changes or infection. Orthopedic Exam: Muscle tone and strength are WNL. No limitations in general ROM. No crepitus or effusions noted. Foot type and digits show no abnormalities. Bony prominences are unremarkable. HAV  B/L  Hammer toe  Skin: No Porokeratosis. No infection or ulcers  Diagnosis:  Onychomycosis, , Pain in right toe, pain in left toes.    Treatment & Plan Procedures and Treatment: Consent by patient was obtained for treatment procedures. The patient understood the discussion of treatment and procedures well. All questions were answered thoroughly reviewed. Debridement of mycotic and hypertrophic toenails, 1 through 5 bilateral and clearing of subungual debris. No ulceration, no infection noted.  Patient was told not to use frezone in future.  Padding dispensed.  RTC 3 months.    Helane Gunther DPM

## 2019-02-03 ENCOUNTER — Ambulatory Visit (INDEPENDENT_AMBULATORY_CARE_PROVIDER_SITE_OTHER): Payer: Medicare Other

## 2019-02-03 ENCOUNTER — Other Ambulatory Visit: Payer: Self-pay

## 2019-02-03 DIAGNOSIS — G4733 Obstructive sleep apnea (adult) (pediatric): Secondary | ICD-10-CM | POA: Diagnosis not present

## 2019-02-03 NOTE — Progress Notes (Signed)
95 percentile pressure 10   95th percentile leak 57.3   apnea index 0.3 /hr  apnea-hypopnea index  0.4 /hr   total days used  >4 hr 73 days  total days used <4 hr 8 days  Total compliance 81 percent  Danielle [name] cpap humidifier is leaking spoke with daughter about getting a new water chamber. She will check and call if she does not have one at home.

## 2019-02-08 ENCOUNTER — Ambulatory Visit: Payer: Medicare Other | Admitting: Podiatry

## 2019-02-15 ENCOUNTER — Other Ambulatory Visit: Payer: Self-pay

## 2019-02-15 ENCOUNTER — Ambulatory Visit (INDEPENDENT_AMBULATORY_CARE_PROVIDER_SITE_OTHER): Payer: Medicare Other | Admitting: Podiatry

## 2019-02-15 ENCOUNTER — Encounter: Payer: Self-pay | Admitting: Podiatry

## 2019-02-15 DIAGNOSIS — M79675 Pain in left toe(s): Secondary | ICD-10-CM | POA: Diagnosis not present

## 2019-02-15 DIAGNOSIS — M79674 Pain in right toe(s): Secondary | ICD-10-CM | POA: Diagnosis not present

## 2019-02-15 DIAGNOSIS — B351 Tinea unguium: Secondary | ICD-10-CM

## 2019-02-15 NOTE — Progress Notes (Signed)
Complaint:  Visit Type: Patient returns to my office for continued preventative foot care services. Complaint: Patient states" my nails have grown long and thick and become painful to walk and wear shoes" . The patient presents for preventative foot care services. No changes to ROS.  Patient is accompanied today by the daughter.  Podiatric Exam: Vascular: dorsalis pedis and posterior tibial pulses are palpable bilateral. Capillary return is immediate. Temperature gradient is WNL. Skin turgor WNL  Sensorium: Normal Semmes Weinstein monofilament test. Normal tactile sensation bilaterally. Nail Exam: Pt has thick disfigured discolored nails with subungual debris noted bilateral entire nail hallux through fifth toenails Ulcer Exam: There is no evidence of ulcer or pre-ulcerative changes or infection. Orthopedic Exam: Muscle tone and strength are WNL. No limitations in general ROM. No crepitus or effusions noted. Foot type and digits show no abnormalities. Bony prominences are unremarkable. HAV  Right greater than left.  Overlapping hammer toe second  right foot.   Skin: No Porokeratosis. No infection or ulcers.  Asymptomatic corns 2-4 right.  Diagnosis:  Onychomycosis, , Pain in right toe, pain in left toes.    Treatment & Plan Procedures and Treatment: Consent by patient was obtained for treatment procedures. The patient understood the discussion of treatment and procedures well. All questions were answered thoroughly reviewed. Debridement of mycotic and hypertrophic toenails, 1 through 5 bilateral and clearing of subungual debris. No ulceration, no infection noted.   Return Visit-Office Procedure: Patient instructed to return to the office for a follow up visit 3 months for continued evaluation and treatment.    Gardiner Barefoot DPM

## 2019-04-06 ENCOUNTER — Ambulatory Visit: Payer: Self-pay | Admitting: Internal Medicine

## 2019-05-17 ENCOUNTER — Encounter: Payer: Self-pay | Admitting: Podiatry

## 2019-05-17 ENCOUNTER — Ambulatory Visit (INDEPENDENT_AMBULATORY_CARE_PROVIDER_SITE_OTHER): Payer: Medicare Other | Admitting: Podiatry

## 2019-05-17 ENCOUNTER — Other Ambulatory Visit: Payer: Self-pay

## 2019-05-17 DIAGNOSIS — M79675 Pain in left toe(s): Secondary | ICD-10-CM | POA: Diagnosis not present

## 2019-05-17 DIAGNOSIS — M79674 Pain in right toe(s): Secondary | ICD-10-CM | POA: Diagnosis not present

## 2019-05-17 DIAGNOSIS — M2040 Other hammer toe(s) (acquired), unspecified foot: Secondary | ICD-10-CM | POA: Diagnosis not present

## 2019-05-17 DIAGNOSIS — M2010 Hallux valgus (acquired), unspecified foot: Secondary | ICD-10-CM | POA: Diagnosis not present

## 2019-05-17 DIAGNOSIS — B351 Tinea unguium: Secondary | ICD-10-CM

## 2019-05-17 NOTE — Progress Notes (Signed)
Complaint:  Visit Type: Patient returns to my office for continued preventative foot care services. Complaint: Patient states" my nails have grown long and thick and become painful to walk and wear shoes" . The patient presents for preventative foot care services. No changes to ROS.  Patient is accompanied today by the daughter.  Podiatric Exam: Vascular: dorsalis pedis and posterior tibial pulses are palpable bilateral. Capillary return is immediate. Temperature gradient is WNL. Skin turgor WNL  Sensorium: Normal Semmes Weinstein monofilament test. Normal tactile sensation bilaterally. Nail Exam: Pt has thick disfigured discolored nails with subungual debris noted bilateral entire nail hallux through fifth toenails Ulcer Exam: There is no evidence of ulcer or pre-ulcerative changes or infection. Orthopedic Exam: Muscle tone and strength are WNL. No limitations in general ROM. No crepitus or effusions noted. Foot type and digits show no abnormalities. Bony prominences are unremarkable. HAV  Right greater than left.  Overlapping hammer toe second  right foot.   Skin: No Porokeratosis. No infection or ulcers.  Asymptomatic corns 2-4 right.  Diagnosis:  Onychomycosis, , Pain in right toe, pain in left toes.    Treatment & Plan Procedures and Treatment: Consent by patient was obtained for treatment procedures. The patient understood the discussion of treatment and procedures well. All questions were answered thoroughly reviewed. Debridement of mycotic and hypertrophic toenails, 1 through 5 bilateral and clearing of subungual debris. No ulceration, no infection noted.  Padding second toe right foot due to overlapping second right foot. Return Visit-Office Procedure: Patient instructed to return to the office for a follow up visit 3 months for continued evaluation and treatment.    Gardiner Barefoot DPM

## 2019-08-16 ENCOUNTER — Ambulatory Visit: Payer: Medicare Other | Admitting: Podiatry

## 2019-09-06 ENCOUNTER — Ambulatory Visit: Payer: Medicare Other | Admitting: Podiatry

## 2019-09-07 ENCOUNTER — Telehealth: Payer: Self-pay

## 2019-09-07 NOTE — Telephone Encounter (Signed)
Confirmed appointment with patient. klh °

## 2019-09-08 ENCOUNTER — Ambulatory Visit (INDEPENDENT_AMBULATORY_CARE_PROVIDER_SITE_OTHER): Payer: Medicare Other

## 2019-09-08 ENCOUNTER — Other Ambulatory Visit: Payer: Self-pay

## 2019-09-08 DIAGNOSIS — G4733 Obstructive sleep apnea (adult) (pediatric): Secondary | ICD-10-CM

## 2019-09-08 NOTE — Progress Notes (Signed)
Checked cpap pt stated it would not come on. I believe the power cord was not all the was in and a message on cpap was not cleared, showed daughter how to make sure both was done, and if any problems to call

## 2019-09-09 ENCOUNTER — Telehealth: Payer: Self-pay

## 2019-09-09 NOTE — Telephone Encounter (Signed)
Confirmed pt appt for 09/14/2019. Danielle [name] 

## 2019-09-13 ENCOUNTER — Telehealth: Payer: Self-pay

## 2019-09-13 NOTE — Telephone Encounter (Signed)
Confirmed appointment with patient. klh °

## 2019-09-14 ENCOUNTER — Ambulatory Visit (INDEPENDENT_AMBULATORY_CARE_PROVIDER_SITE_OTHER): Payer: Medicare Other | Admitting: Internal Medicine

## 2019-09-14 ENCOUNTER — Other Ambulatory Visit: Payer: Self-pay

## 2019-09-14 ENCOUNTER — Encounter: Payer: Self-pay | Admitting: Internal Medicine

## 2019-09-14 VITALS — BP 130/60 | HR 75 | Resp 16 | Ht 63.0 in | Wt 260.8 lb

## 2019-09-14 DIAGNOSIS — Z9989 Dependence on other enabling machines and devices: Secondary | ICD-10-CM

## 2019-09-14 DIAGNOSIS — I2510 Atherosclerotic heart disease of native coronary artery without angina pectoris: Secondary | ICD-10-CM

## 2019-09-14 DIAGNOSIS — G4733 Obstructive sleep apnea (adult) (pediatric): Secondary | ICD-10-CM | POA: Diagnosis not present

## 2019-09-14 DIAGNOSIS — I10 Essential (primary) hypertension: Secondary | ICD-10-CM | POA: Diagnosis not present

## 2019-09-14 NOTE — Progress Notes (Signed)
[facility] [address] [place], [place] [address]  Pulmonary Sleep Medicine   Office Visit Note  Patient Name: Danielle [name] DOB: July 07, 1929 MRN [id number]  Date of Service: 09/14/2019  Complaints/HPI: Pt is here for follow up on OSA.  She reports her current cpap will no longer turn off.  She has to unplug the device from the wall to get it to stop.  Her current machine is 84 years old. She denies any DOE, sinus issues or other complaints at this time.  She is concerned, and needing a new machine at this time.     ROS  General: (-) fever, (-) chills, (-) night sweats, (-) weakness Skin: (-) rashes, (-) itching,. Eyes: (-) visual changes, (-) redness, (-) itching. Nose and Sinuses: (-) nasal stuffiness or itchiness, (-) postnasal drip, (-) nosebleeds, (-) sinus trouble. Mouth and Throat: (-) sore throat, (-) hoarseness. Neck: (-) swollen glands, (-) enlarged thyroid, (-) neck pain. Respiratory: - cough, (-) bloody sputum, - shortness of breath, - wheezing. Cardiovascular: - ankle swelling, (-) chest pain. Lymphatic: (-) lymph node enlargement. Neurologic: (-) numbness, (-) tingling. Psychiatric: (-) anxiety, (-) depression   Current Medication: Outpatient Encounter Medications as of 09/14/2019  Medication Sig  . acetaminophen (TYLENOL) 500 MG tablet Take by mouth.  Marland Kitchen alum & mag hydroxide-simeth (MAG-AL PLUS XS) 400-400-40 MG/5ML suspension Take by mouth.  Marland Kitchen aspirin EC 81 MG tablet Take 1 tablet by mouth daily.  . clobetasol ointment (TEMOVATE) 0.05 % Apply twice daily to involved areas  . donepezil (ARICEPT) 5 MG tablet Take 5 mg by mouth at bedtime.  . hydrochlorothiazide (HYDRODIURIL) 25 MG tablet Take 25 mg by mouth daily.  Marland Kitchen levothyroxine (SYNTHROID) 200 MCG tablet   . levothyroxine (SYNTHROID, LEVOTHROID) 25 MCG tablet   . metoprolol succinate (TOPROL-XL) 25 MG 24 hr tablet Take by mouth.  . naproxen (NAPROSYN) 500 MG tablet Take by mouth.  .  nitroGLYCERIN (NITROSTAT) 0.4 MG SL tablet   . ondansetron (ZOFRAN ODT) 4 MG disintegrating tablet Take 1 tablet (4 mg total) by mouth every 8 (eight) hours as needed for nausea or vomiting.  Marland Kitchen oxybutynin (DITROPAN-XL) 10 MG 24 hr tablet   . polyethylene glycol powder (GLYCOLAX/MIRALAX) powder   . potassium chloride (K-DUR,KLOR-CON) 10 MEQ tablet   . potassium chloride (MICRO-K) 10 MEQ CR capsule Take 1 capsule by mouth 2 (two) times daily.  . sertraline (ZOLOFT) 100 MG tablet Take 1 tablet by mouth daily.  . solifenacin (VESICARE) 10 MG tablet   . triamcinolone cream (KENALOG) 0.1 %    No facility-administered encounter medications on file as of 09/14/2019.    Surgical History: Past Surgical History:  Procedure Laterality Date  . ABDOMINAL HYSTERECTOMY    . APPENDECTOMY      Medical History: Past Medical History:  Diagnosis Date  . Anxiety   . Arthritis   . Depression   . Hyperlipemia   . Hypertension   . Hypothyroidism   . Sleep apnea     Family History: Family History  Problem Relation Age of Onset  . Bladder Cancer Neg Hx   . Kidney cancer Neg Hx   . Breast cancer Neg Hx     Social History: Social History   Socioeconomic History  . Marital status: Widowed    Spouse name: Not on file  . Number of children: Not on file  . Years of education: Not on file  . Highest education level: Not on file  Occupational History  .  Not on file  Tobacco Use  . Smoking status: Never Smoker  . Smokeless tobacco: Never Used  Substance and Sexual Activity  . Alcohol use: No  . Drug use: No  . Sexual activity: Not on file  Other Topics Concern  . Not on file  Social History Narrative  . Not on file   Social Determinants of Health   Financial Resource Strain:   . Difficulty of Paying Living Expenses: Not on file  Food Insecurity:   . Worried About Programme researcher, broadcasting/film/video in the Last Year: Not on file  . Ran Out of Food in the Last Year: Not on file  Transportation Needs:    . Lack of Transportation (Medical): Not on file  . Lack of Transportation (Non-Medical): Not on file  Physical Activity:   . Days of Exercise per Week: Not on file  . Minutes of Exercise per Session: Not on file  Stress:   . Feeling of Stress : Not on file  Social Connections:   . Frequency of Communication with Friends and Family: Not on file  . Frequency of Social Gatherings with Friends and Family: Not on file  . Attends Religious Services: Not on file  . Active Member of Clubs or Organizations: Not on file  . Attends Banker Meetings: Not on file  . Marital Status: Not on file  Intimate Partner Violence:   . Fear of Current or Ex-Partner: Not on file  . Emotionally Abused: Not on file  . Physically Abused: Not on file  . Sexually Abused: Not on file    Vital Signs: Blood pressure (!) 168/72, pulse 75, resp. rate 16, height 5\' 3"  (1.6 m), weight 260 lb 12.8 oz (118.3 kg), SpO2 99 %.  Examination: General Appearance: The patient is well-developed, well-nourished, and in no distress. Skin: Gross inspection of skin unremarkable. Head: normocephalic, no gross deformities. Eyes: no gross deformities noted. ENT: ears appear grossly normal no exudates. Neck: Supple. No thyromegaly. No LAD. Respiratory: clear bilaterally. Cardiovascular: Normal S1 and S2 without murmur or rub. Extremities: No cyanosis. pulses are equal. Neurologic: Alert and oriented. No involuntary movements.  LABS: No results found for this or any previous visit (from the past 2160 hour(s)).  Radiology: DG BONE DENSITY (DXA)  Result Date: 05/06/2018 EXAM: DUAL X-RAY ABSORPTIOMETRY (DXA) FOR BONE MINERAL DENSITY IMPRESSION: Dear Dr. 07/06/2018, Your patient Pasqualina Allena Katz completed a BMD test on 05/06/2018 using the Lunar iDXA DXA System (analysis version: 14.10) manufactured by 07/06/2018. The following summarizes the results of our evaluation. PATIENT BIOGRAPHICAL: Name: [employer], Casidy R Patient  ID: [name] Birth Date: 08/22/1929 Height: 62.0 in. Gender: Female Exam Date: 05/06/2018 Weight: 242.5 lbs. Indications: Advanced Age, Hypothyroid, Hysterectomy, Postmenopausal Fractures: Treatments: ASPRIN 81 MG, LEVOTHYROXINE ASSESSMENT: The BMD measured at Femur Neck Left is 0.913 g/cm2 with a T-score of -0.9. This patient is considered normal according to World Health Organization [facility]) criteria. Site Region Measured Measured WHO Young Adult BMD Date       Age      Classification T-score AP Spine L1-L4 05/06/2018 87.4 Normal 0.8 1.292 g/cm2 AP Spine L1-L4 11/21/2015 84.9 Normal 0.6 1.272 g/cm2 AP Spine L1-L4 06/10/2012 81.5 Normal 0.7 1.279 g/cm2 AP Spine L1-L4 01/10/2010 79.0 Normal -0.1 1.187 g/cm2 DualFemur Neck Left 05/06/2018 87.4 Normal -0.9 0.913 g/cm2 DualFemur Neck Left 11/21/2015 84.9 Osteopenia -1.4 0.848 g/cm2 DualFemur Neck Left 06/10/2012 81.5 Normal -0.7 0.946 g/cm2 DualFemur Neck Left 01/10/2010 79.0 Normal -0.9 0.908 g/cm2 DualFemur Total Mean  05/06/2018 87.4 Normal -0.4 0.961 g/cm2 DualFemur Total Mean 11/21/2015 84.9 Normal -0.5 0.944 g/cm2 DualFemur Total Mean 06/10/2012 81.5 Normal -0.1 0.995 g/cm2 DualFemur Total Mean 01/10/2010 79.0 Normal -0.3 0.971 g/cm2 World Health Organization [facility]) criteria for post-menopausal, Caucasian Women: Normal:       T-score at or above -1 SD Osteopenia:   T-score between -1 and -2.5 SD Osteoporosis: T-score at or below -2.5 SD RECOMMENDATIONS: 1. All patients should optimize calcium and vitamin D intake. 2. Consider FDA-approved medical therapies in postmenopausal women and men aged 22 years and older, based on the following: a. A hip or vertebral(clinical or morphometric) fracture b. T-score < -2.5 at the femoral neck or spine after appropriate evaluation to exclude secondary causes c. Low bone mass (T-score between -1.0 and -2.5 at the femoral neck or spine) and a 10-year probability of a hip fracture > 3% or a 10-year probability of a major  osteoporosis-related fracture > 20% based on the [place]-adapted WHO algorithm d. Clinician judgment and/or patient preferences may indicate treatment for people with 10-year fracture probabilities above or below these levels FOLLOW-UP: People with diagnosed cases of osteoporosis or at high risk for fracture should have regular bone mineral density tests. For patients eligible for Medicare, routine testing is allowed once every 2 years. The testing frequency can be increased to one year for patients who have rapidly progressing disease, those who are receiving or discontinuing medical therapy to restore bone mass, or have additional risk factors. I have reviewed this report, and agree with the above findings. [facility] Radiology Electronically Signed   By: Bretta Bang III M.D.   On: 05/06/2018 14:47    No results found.  No results found.    Assessment and Plan: Patient Active Problem List   Diagnosis Date Noted  . Pain due to onychomycosis of toenails of both feet 02/15/2019  . Urge incontinence of urine 07/03/2017  . Coronary arteriosclerosis in native artery 05/29/2014  . CAD (coronary artery disease), native coronary artery 05/29/2014  . Hyperlipidemia 05/29/2014  . Hypertension 05/29/2014  . Hyperlipidemia, unspecified 05/29/2014  . Pain in extremity 06/21/2013  . Onychomycosis due to dermatophyte 06/21/2013    1. OSA on CPAP Pts machine is 84 years old.  It no longer will turn off.  She will likely need a new machine at this time, however she has a sleep clinic appt tomorrow for evaluation.   2. Hypertension, unspecified type Slightly elevated today.  130/60 on recheck.   3. Morbid obesity (HCC) Obesity Counseling: Risk Assessment: An assessment of behavioral risk factors was made today and includes lack of exercise sedentary lifestyle, lack of portion control and poor dietary habits.  Risk Modification Advice: She was counseled on portion control guidelines. Restricting daily  caloric intake to 1800. The detrimental long term effects of obesity on her health and ongoing poor compliance was also discussed with the patient.  4. Coronary artery disease involving native coronary artery of native heart without angina pectoris Controlled, continue to follow as before.   General Counseling: I have discussed the findings of the evaluation and examination with Bernis.  I have also discussed any further diagnostic evaluation thatmay be needed or ordered today. Marguita verbalizes understanding of the findings of todays visit. We also reviewed her medications today and discussed drug interactions and side effects including but not limited excessive drowsiness and altered mental states. We also discussed that there is always a risk not just to her but also people around her. she  has been encouraged to call the office with any questions or concerns that should arise related to todays visit.  No orders of the defined types were placed in this encounter.    Time spent: 25  I have personally obtained a history, examined the patient, evaluated laboratory and imaging results, formulated the assessment and plan and placed orders.    Allyne Gee, MD [facility] Pulmonary and Critical Care Sleep medicine

## 2019-09-15 ENCOUNTER — Ambulatory Visit (INDEPENDENT_AMBULATORY_CARE_PROVIDER_SITE_OTHER): Payer: Medicare Other

## 2019-09-15 ENCOUNTER — Telehealth: Payer: Self-pay

## 2019-09-15 DIAGNOSIS — G4733 Obstructive sleep apnea (adult) (pediatric): Secondary | ICD-10-CM

## 2019-09-15 NOTE — Progress Notes (Signed)
95 percentile pressure 10   95th percentile leak 26.8   apnea index 16.9 /hr  apnea-hypopnea index  17.0 /hr   total days used  >4 hr 52 days  total days used <4 hr 22 days  Total compliance 58 percent  Her smart start ( auto on and off) stopped working on the cutoff she will have to cut cpap off manually. Will check to see when she got cpap for eligibility on new one

## 2019-09-15 NOTE — Telephone Encounter (Signed)
Patient DME company is with [employer] and patient received new machine 01/2018, I spoke with [employer] and they will service her machine under the warranty and go to patient home. Beth

## 2019-09-22 ENCOUNTER — Ambulatory Visit: Payer: Medicare Other | Admitting: Podiatry

## 2019-09-22 ENCOUNTER — Other Ambulatory Visit: Payer: Self-pay

## 2019-09-23 ENCOUNTER — Encounter: Payer: Self-pay | Admitting: Podiatry

## 2019-09-23 ENCOUNTER — Ambulatory Visit (INDEPENDENT_AMBULATORY_CARE_PROVIDER_SITE_OTHER): Payer: Medicare Other | Admitting: Podiatry

## 2019-09-23 DIAGNOSIS — M79674 Pain in right toe(s): Secondary | ICD-10-CM

## 2019-09-23 DIAGNOSIS — M2040 Other hammer toe(s) (acquired), unspecified foot: Secondary | ICD-10-CM

## 2019-09-23 DIAGNOSIS — M79675 Pain in left toe(s): Secondary | ICD-10-CM

## 2019-09-23 DIAGNOSIS — B351 Tinea unguium: Secondary | ICD-10-CM | POA: Diagnosis not present

## 2019-09-23 DIAGNOSIS — M2010 Hallux valgus (acquired), unspecified foot: Secondary | ICD-10-CM | POA: Diagnosis not present

## 2019-09-23 NOTE — Progress Notes (Signed)
Complaint:  Visit Type: Patient returns to my office for continued preventative foot care services. Complaint: Patient states" my nails have grown long and thick and become painful to walk and wear shoes" . The patient presents for preventative foot care services. No changes to ROS.  Patient is accompanied today by the daughter.  Podiatric Exam: Vascular: dorsalis pedis and posterior tibial pulses are palpable bilateral. Capillary return is immediate. Temperature gradient is WNL. Skin turgor WNL  Sensorium: Normal Semmes Weinstein monofilament test. Normal tactile sensation bilaterally. Nail Exam: Pt has thick disfigured discolored nails with subungual debris noted bilateral entire nail hallux through fifth toenails Ulcer Exam: There is no evidence of ulcer or pre-ulcerative changes or infection. Orthopedic Exam: Muscle tone and strength are WNL. No limitations in general ROM. No crepitus or effusions noted. Foot type and digits show no abnormalities. Bony prominences are unremarkable. HAV  Right greater than left.  Overlapping hammer toe second  right foot.   Skin: No Porokeratosis. No infection or ulcers.  Asymptomatic corns 2-4 right.  Diagnosis:  Onychomycosis, , Pain in right toe, pain in left toes.    Treatment & Plan Procedures and Treatment: Consent by patient was obtained for treatment procedures. The patient understood the discussion of treatment and procedures well. All questions were answered thoroughly reviewed. Debridement of mycotic and hypertrophic toenails, 1 through 5 bilateral and clearing of subungual debris. No ulceration, no infection noted.  Padding second toe right foot due to overlapping second right foot. Patient requests surgery on her overlapping second toe right foot. Discussed surgery and offered to refer her to surgical doctor.  Consider future referral. Return Visit-Office Procedure: Patient instructed to return to the office for a follow up visit 3 months for  continued evaluation and treatment.    Helane Gunther DPM

## 2019-10-27 ENCOUNTER — Ambulatory Visit (INDEPENDENT_AMBULATORY_CARE_PROVIDER_SITE_OTHER): Payer: Medicare Other | Admitting: Podiatry

## 2019-10-27 ENCOUNTER — Ambulatory Visit: Payer: Medicare Other

## 2019-10-27 ENCOUNTER — Encounter: Payer: Self-pay | Admitting: Podiatry

## 2019-10-27 ENCOUNTER — Other Ambulatory Visit: Payer: Self-pay

## 2019-10-27 DIAGNOSIS — M2041 Other hammer toe(s) (acquired), right foot: Secondary | ICD-10-CM

## 2019-10-27 DIAGNOSIS — M2011 Hallux valgus (acquired), right foot: Secondary | ICD-10-CM

## 2019-10-27 NOTE — Progress Notes (Signed)
She presents today chief complaint of painful first metatarsophalangeal joint and second hammertoe as well as pain across the dorsum of the right foot.  Objective: Vital signs are stable she is alert oriented x3 no open lesions or wounds.  Pulses remain palpable at +1/4 DP is bilateral capillary fill time is immediate.  Significant hallux valgus deformity rigid in nature with an overlapping second toe.  There is no skin breakdown here noted.  Assessment: Severe osteoarthritic changes midfoot bilateral severe osteoarthritic changes first metatarsophalangeal joint hammertoe deformity arthritic as well second right.  Plan: Discussed etiology pathology and surgical therapies at this point I recommended that she start with Voltaren gel across the dorsum of the foot and we placed silicone padding to the right foot bunion and second toe.

## 2019-12-23 ENCOUNTER — Ambulatory Visit: Payer: Medicare Other | Admitting: Podiatry

## 2020-01-03 ENCOUNTER — Ambulatory Visit (INDEPENDENT_AMBULATORY_CARE_PROVIDER_SITE_OTHER): Payer: Medicare Other | Admitting: Podiatry

## 2020-01-03 ENCOUNTER — Other Ambulatory Visit: Payer: Self-pay

## 2020-01-03 ENCOUNTER — Encounter: Payer: Self-pay | Admitting: Podiatry

## 2020-01-03 DIAGNOSIS — M79675 Pain in left toe(s): Secondary | ICD-10-CM | POA: Diagnosis not present

## 2020-01-03 DIAGNOSIS — M2010 Hallux valgus (acquired), unspecified foot: Secondary | ICD-10-CM

## 2020-01-03 DIAGNOSIS — M79674 Pain in right toe(s): Secondary | ICD-10-CM | POA: Diagnosis not present

## 2020-01-03 DIAGNOSIS — B351 Tinea unguium: Secondary | ICD-10-CM | POA: Diagnosis not present

## 2020-01-03 DIAGNOSIS — M2011 Hallux valgus (acquired), right foot: Secondary | ICD-10-CM

## 2020-01-03 DIAGNOSIS — M2040 Other hammer toe(s) (acquired), unspecified foot: Secondary | ICD-10-CM

## 2020-01-03 NOTE — Progress Notes (Signed)
This patient returns to the office for evaluation and treatment of long thick painful nails .  This patient is unable to trim her own nails since the patient cannot reach the feet.  Patient says the nails are painful walking and wearing her shoes.  She returns for preventive foot care services.  General Appearance  Alert, conversant and in no acute stress.  Vascular  Dorsalis pedis and posterior tibial  pulses are palpable  bilaterally.  Capillary return is within normal limits  bilaterally. Temperature is within normal limits  bilaterally.  Neurologic  Senn-Weinstein monofilament wire test within normal limits  bilaterally. Muscle power within normal limits bilaterally.  Nails Thick disfigured discolored nails with subungual debris  from hallux to fifth toes bilaterally. No evidence of bacterial infection or drainage bilaterally.  Orthopedic  No limitations of motion  feet .  No crepitus or effusions noted.  No bony pathology or digital deformities noted.  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  Skin  normotropic skin with no porokeratosis noted bilaterally.  No signs of infections or ulcers noted.     Onychomycosis  Pain in toes right foot  Pain in toes left foot  Debridement  of nails  1-5  B/L with a nail nipper.  Nails were then filed using a dremel tool with no incidents.   RTC  3 months   Teliyah Royal DPM  

## 2020-02-29 ENCOUNTER — Encounter: Payer: Self-pay | Admitting: Ophthalmology

## 2020-03-07 ENCOUNTER — Other Ambulatory Visit: Payer: Medicare Other

## 2020-03-09 ENCOUNTER — Ambulatory Visit
Admission: RE | Admit: 2020-03-09 | Discharge: 2020-03-09 | Disposition: A | Discharge status: Home / Self Care | Payer: Medicare Other | Attending: Ophthalmology | Admitting: Ophthalmology

## 2020-03-09 ENCOUNTER — Other Ambulatory Visit: Payer: Self-pay

## 2020-03-09 ENCOUNTER — Ambulatory Visit: Payer: Medicare Other | Admitting: Certified Registered Nurse Anesthetist

## 2020-03-09 ENCOUNTER — Encounter
Admission: RE | Disposition: A | Discharge status: Home / Self Care | Payer: Self-pay | Source: Home / Self Care | Attending: Ophthalmology

## 2020-03-09 ENCOUNTER — Encounter: Payer: Self-pay | Admitting: Ophthalmology

## 2020-03-09 DIAGNOSIS — H2512 Age-related nuclear cataract, left eye: Secondary | ICD-10-CM | POA: Diagnosis present

## 2020-03-09 DIAGNOSIS — M7989 Other specified soft tissue disorders: Secondary | ICD-10-CM | POA: Insufficient documentation

## 2020-03-09 DIAGNOSIS — I252 Old myocardial infarction: Secondary | ICD-10-CM | POA: Insufficient documentation

## 2020-03-09 DIAGNOSIS — Z6841 Body Mass Index (BMI) 40.0 and over, adult: Secondary | ICD-10-CM | POA: Insufficient documentation

## 2020-03-09 DIAGNOSIS — I10 Essential (primary) hypertension: Secondary | ICD-10-CM | POA: Diagnosis not present

## 2020-03-09 DIAGNOSIS — G43909 Migraine, unspecified, not intractable, without status migrainosus: Secondary | ICD-10-CM | POA: Diagnosis not present

## 2020-03-09 DIAGNOSIS — M1990 Unspecified osteoarthritis, unspecified site: Secondary | ICD-10-CM | POA: Insufficient documentation

## 2020-03-09 DIAGNOSIS — G4730 Sleep apnea, unspecified: Secondary | ICD-10-CM | POA: Insufficient documentation

## 2020-03-09 DIAGNOSIS — R0602 Shortness of breath: Secondary | ICD-10-CM | POA: Insufficient documentation

## 2020-03-09 DIAGNOSIS — I25110 Atherosclerotic heart disease of native coronary artery with unstable angina pectoris: Secondary | ICD-10-CM | POA: Diagnosis not present

## 2020-03-09 DIAGNOSIS — Z880 Allergy status to penicillin: Secondary | ICD-10-CM | POA: Diagnosis not present

## 2020-03-09 DIAGNOSIS — Z90710 Acquired absence of both cervix and uterus: Secondary | ICD-10-CM | POA: Insufficient documentation

## 2020-03-09 DIAGNOSIS — E7800 Pure hypercholesterolemia, unspecified: Secondary | ICD-10-CM | POA: Diagnosis not present

## 2020-03-09 DIAGNOSIS — E785 Hyperlipidemia, unspecified: Secondary | ICD-10-CM | POA: Diagnosis not present

## 2020-03-09 DIAGNOSIS — E039 Hypothyroidism, unspecified: Secondary | ICD-10-CM | POA: Insufficient documentation

## 2020-03-09 DIAGNOSIS — F329 Major depressive disorder, single episode, unspecified: Secondary | ICD-10-CM | POA: Diagnosis not present

## 2020-03-09 DIAGNOSIS — F419 Anxiety disorder, unspecified: Secondary | ICD-10-CM | POA: Diagnosis not present

## 2020-03-09 HISTORY — DX: Headache, unspecified: R51.9

## 2020-03-09 HISTORY — DX: Lipoprotein deficiency: E78.6

## 2020-03-09 HISTORY — DX: Personal history of other diseases of the circulatory system: Z86.79

## 2020-03-09 HISTORY — PX: CATARACT EXTRACTION W/PHACO: SHX586

## 2020-03-09 HISTORY — DX: Dyspnea, unspecified: R06.00

## 2020-03-09 SURGERY — PHACOEMULSIFICATION, CATARACT, WITH IOL INSERTION
Anesthesia: Monitor Anesthesia Care | Site: Eye | Laterality: Left

## 2020-03-09 MED ORDER — NA HYALUR & NA CHOND-NA HYALUR 0.4-0.35 ML IO KIT
PACK | INTRAOCULAR | Status: DC | PRN
  Administered 2020-03-09: .55 mL via INTRAOCULAR

## 2020-03-09 MED ORDER — ARMC OPHTHALMIC DILATING DROPS
1.0000 "application " | OPHTHALMIC | Status: AC
  Administered 2020-03-09 (×3): 1 via OPHTHALMIC

## 2020-03-09 MED ORDER — TETRACAINE HCL 0.5 % OP SOLN
1.0000 [drp] | Freq: Once | OPHTHALMIC | Status: AC
  Administered 2020-03-09 (×2): 1 [drp] via OPHTHALMIC

## 2020-03-09 MED ORDER — MIDAZOLAM HCL 2 MG/2ML IJ SOLN
INTRAMUSCULAR | Status: DC | PRN
  Administered 2020-03-09 (×2): 1 mg via INTRAVENOUS

## 2020-03-09 MED ORDER — NEOMYCIN-POLYMYXIN-DEXAMETH 0.1 % OP OINT
TOPICAL_OINTMENT | OPHTHALMIC | Status: DC | PRN
  Administered 2020-03-09: 1 via OPHTHALMIC

## 2020-03-09 MED ORDER — MOXIFLOXACIN HCL 0.5 % OP SOLN
OPHTHALMIC | Status: AC
  Filled 2020-03-09: qty 6

## 2020-03-09 MED ORDER — POVIDONE-IODINE 5 % OP SOLN
OPHTHALMIC | Status: DC | PRN
  Administered 2020-03-09: 1 via OPHTHALMIC

## 2020-03-09 MED ORDER — SODIUM CHLORIDE 0.9 % IV SOLN: INTRAVENOUS | Status: DC

## 2020-03-09 MED ORDER — NA CHONDROIT SULF-NA HYALURON 40-30 MG/ML IO SOLN
INTRAOCULAR | Status: DC | PRN
  Administered 2020-03-09: 0.5 mL via INTRAOCULAR

## 2020-03-09 MED ORDER — MOXIFLOXACIN HCL 0.5 % OP SOLN
1.0000 [drp] | OPHTHALMIC | Status: AC
  Administered 2020-03-09: 1 [drp] via OPHTHALMIC

## 2020-03-09 MED ORDER — LIDOCAINE HCL (PF) 4 % IJ SOLN
INTRAOCULAR | Status: DC | PRN
  Administered 2020-03-09: 4 mL via OPHTHALMIC

## 2020-03-09 MED ORDER — TETRACAINE HCL 0.5 % OP SOLN
OPHTHALMIC | Status: AC
  Filled 2020-03-09: qty 4

## 2020-03-09 MED ORDER — EPINEPHRINE PF 1 MG/ML IJ SOLN
INTRAOCULAR | Status: DC | PRN
  Administered 2020-03-09: 200 mL via OPHTHALMIC

## 2020-03-09 MED ORDER — MOXIFLOXACIN HCL 0.5 % OP SOLN
1.0000 [drp] | Freq: Once | OPHTHALMIC | Status: AC
  Administered 2020-03-09: 1 [drp] via OPHTHALMIC

## 2020-03-09 MED ORDER — MIDAZOLAM HCL 2 MG/2ML IJ SOLN
INTRAMUSCULAR | Status: AC
  Filled 2020-03-09: qty 2

## 2020-03-09 MED ORDER — ARMC OPHTHALMIC DILATING DROPS
OPHTHALMIC | Status: AC
  Filled 2020-03-09: qty 0.5

## 2020-03-09 SURGICAL SUPPLY — 18 items
GLOVE BIO SURGEON STRL SZ8 (GLOVE) ×2 IMPLANT
GLOVE BIOGEL M 6.5 STRL (GLOVE) ×2 IMPLANT
GLOVE SURG LX 7.5 STRW (GLOVE) ×1
GLOVE SURG LX STRL 7.5 STRW (GLOVE) ×1 IMPLANT
GOWN STRL REUS W/ TWL LRG LVL3 (GOWN DISPOSABLE) ×2 IMPLANT
GOWN STRL REUS W/TWL LRG LVL3 (GOWN DISPOSABLE) ×2
LABEL CATARACT MEDS ST (LABEL) ×2 IMPLANT
LENS IOL ACRYSOF IQ 20.5 (Intraocular Lens) ×1 IMPLANT
NDL HPO THNWL 1X22GA REG BVL (NEEDLE) ×1 IMPLANT
NEEDLE SAFETY 22GX1 (NEEDLE) ×1
PACK CATARACT (MISCELLANEOUS) ×2 IMPLANT
PACK CATARACT BRASINGTON LX (MISCELLANEOUS) ×2 IMPLANT
PACK EYE AFTER SURG (MISCELLANEOUS) ×2 IMPLANT
SOL BSS BAG (MISCELLANEOUS) ×2
SOLUTION BSS BAG (MISCELLANEOUS) ×1 IMPLANT
SYR 5ML LL (SYRINGE) ×2 IMPLANT
WATER STERILE IRR 250ML POUR (IV SOLUTION) ×2 IMPLANT
WIPE NON LINTING 3.25X3.25 (MISCELLANEOUS) ×2 IMPLANT

## 2020-03-09 NOTE — Discharge Instructions (Addendum)
Eye Surgery Discharge Instructions  Expect mild scratchy sensation or mild soreness. DO NOT RUB YOUR EYE!  The day of surgery: . Minimal physical activity, but bed rest is not required . No reading, computer work, or close hand work . No bending, lifting, or straining. . May watch TV  For 24 hours: . No driving, legal decisions, or alcoholic beverages . Safety precautions . Eat anything you prefer: It is better to start with liquids, then soup then solid foods. . Solar shield eyeglasses should be worn for comfort in the sunlight/patch while sleeping  Resume all regular medications including aspirin or Coumadin if these were discontinued prior to surgery. You may shower, bathe, shave, or wash your hair. Tylenol may be taken for mild discomfort. Follow eye drop instruction sheet as reviewed.  Call your doctor if you experience significant pain, nausea, or vomiting, fever > 101 or other signs of infection. [phone] or [phone] Specific instructions:   Follow-up Information    Lockie Mola, MD Follow up.   Specialty: Ophthalmology Why: 03/10/20 @ 10:40 am Contact information: [address]   [place] [address] [phone]

## 2020-03-09 NOTE — Transfer of Care (Signed)
Immediate Anesthesia Transfer of Care Note  Patient: Danielle [name]  Procedure(s) Performed: CATARACT EXTRACTION PHACO AND INTRAOCULAR LENS PLACEMENT (IOC) LEFT (Left Eye)  Patient Location: PACU and Short Stay  Anesthesia Type:MAC  Level of Consciousness: awake, alert  and oriented  Airway & Oxygen Therapy: Patient Spontanous Breathing  Post-op Assessment: Report given to RN and Post -op Vital signs reviewed and stable  Post vital signs: Reviewed and stable  Last Vitals:  Vitals Value Taken Time  BP 136/52 03/09/20 0956  Temp 36.8 C 03/09/20 0956  Pulse 73 03/09/20 0956  Resp 13 03/09/20 0956  SpO2 100 % 03/09/20 0956    Last Pain:  Vitals:   03/09/20 0956  TempSrc: Oral  PainSc:          Complications: No complications documented.

## 2020-03-09 NOTE — Anesthesia Postprocedure Evaluation (Signed)
Anesthesia Post Note  Patient: Danielle [name]  Procedure(s) Performed: CATARACT EXTRACTION PHACO AND INTRAOCULAR LENS PLACEMENT (IOC) LEFT (Left Eye)  Patient location during evaluation: Phase II Anesthesia Type: MAC Level of consciousness: awake and alert Pain management: pain level controlled Vital Signs Assessment: post-procedure vital signs reviewed and stable Respiratory status: spontaneous breathing, nonlabored ventilation and respiratory function stable Cardiovascular status: stable and blood pressure returned to baseline Postop Assessment: no apparent nausea or vomiting Anesthetic complications: no   No complications documented.   Last Vitals:  Vitals:   03/09/20 0752 03/09/20 0956  BP: (!) 153/71 (!) 136/52  Pulse: 81 73  Resp: 16 13  Temp: (!) 36.4 C 36.8 C  SpO2: 100% 100%    Last Pain:  Vitals:   03/09/20 0956  TempSrc: Oral  PainSc:                  Brantley Fling

## 2020-03-09 NOTE — H&P (Signed)

## 2020-03-09 NOTE — Anesthesia Procedure Notes (Signed)
Procedure Name: MAC Date/Time: 03/09/2020 9:28 AM Performed by: Rolla Plate, CRNA Pre-anesthesia Checklist: Patient identified, Emergency Drugs available, Suction available, Patient being monitored and Timeout performed Oxygen Delivery Method: Nasal cannula

## 2020-03-09 NOTE — Op Note (Signed)
OPERATIVE NOTE  Danielle [name] [id number] 03/09/2020   PREOPERATIVE DIAGNOSIS:  Nuclear sclerotic cataract left eye. H25.12   POSTOPERATIVE DIAGNOSIS:    Nuclear sclerotic cataract left eye.     PROCEDURE:  Phacoemusification with posterior chamber intraocular lens placement of the left eye  Procedure(s) with comments: CATARACT EXTRACTION PHACO AND INTRAOCULAR LENS PLACEMENT (IOC) LEFT (Left) - cde 9.56 us1:32.8 ap10.3% LENS:   Implant Name Type Inv. Item Serial No. Manufacturer Lot No. LRB No. Used Action  LENS IOL ACRYSOF IQ 20.5 - [id number] Intraocular Lens LENS IOL ACRYSOF IQ 20.5 [id number] ALCON  Left 1 Implanted         SURGEON:  Deirdre Evener, MD   ANESTHESIA:  Topical with tetracaine drops and 2% Xylocaine jelly, augmented with 1% preservative-free intracameral lidocaine.    COMPLICATIONS:  None.   DESCRIPTION OF PROCEDURE:  The patient was identified in the holding room and transported to the operating room and placed in the supine position under the operating microscope.  The left eye was identified as the operative eye and it was prepped and draped in the usual sterile ophthalmic fashion.   A 1 millimeter clear-corneal paracentesis was made at the 1:30 position. 0.5 ml of preservative-free 1% lidocaine was injected into the anterior chamber.  The anterior chamber was filled with Viscoat viscoelastic.  A 2.4 millimeter keratome was used to make a near-clear corneal incision at the 10:30 position.  .  A curvilinear capsulorrhexis was made with a cystotome and capsulorrhexis forceps.  Balanced salt solution was used to hydrodissect and hydrodelineate the nucleus.   Phacoemulsification was then used in stop and chop fashion to remove the lens nucleus and epinucleus.  The remaining cortex was then removed using the irrigation and aspiration handpiece. Provisc was then placed into the capsular bag to distend it for lens placement.  A lens was then injected into the  capsular bag.  The remaining viscoelastic was aspirated.   Wounds were hydrated with balanced salt solution.  The anterior chamber was inflated to a physiologic pressure with balanced salt solution. Vigamox 0.2 ml of a 1mg  per ml solution was injected into the anterior chamber for a dose of 0.2 mg of intracameral antibiotic at the completion of the case.  Miostat was placed into the anterior chamber to constrict the pupil.  No wound leaks were noted.  Topical Vigamox drops and Maxitrol ointment were applied to the eye.  The patient was taken to the recovery room in stable condition without complications of anesthesia or surgery  [name] 03/09/2020, 9:52 AM

## 2020-03-09 NOTE — Anesthesia Preprocedure Evaluation (Addendum)
Anesthesia Evaluation  Patient identified by MRN, date of birth, ID band Patient awake    Reviewed: Allergy & Precautions, H&P , NPO status , Patient's Chart, lab work & pertinent test results  History of Anesthesia Complications Negative for: history of anesthetic complications  Airway Mallampati: III  TM Distance: >3 FB     Dental  (+) Teeth Intact   Pulmonary shortness of breath and with exertion, sleep apnea ,    breath sounds clear to auscultation       Cardiovascular Exercise Tolerance: Poor hypertension, + angina (stable angina, has not required NTG lately) with exertion + CAD and + Past MI   Rhythm:regular Rate:Normal     Neuro/Psych  Headaches, PSYCHIATRIC DISORDERS Anxiety Depression    GI/Hepatic negative GI ROS, Neg liver ROS,   Endo/Other  Hypothyroidism Morbid obesity  Renal/GU      Musculoskeletal  (+) Arthritis ,   Abdominal   Peds  Hematology negative hematology ROS (+)   Anesthesia Other Findings Past Medical History: No date: Anxiety No date: Arthritis No date: Depression No date: Dyspnea     Comment:  with exertion No date: Headache     Comment:  migraines No date: History of myocarditis No date: Hyperlipemia No date: Hypertension No date: Hypocholesterolemia No date: Hypothyroidism 2010: Myocardial infarction ([facility]) No date: Sleep apnea     Comment:  uses a cpap  Past Surgical History: No date: ABDOMINAL HYSTERECTOMY No date: APPENDECTOMY No date: EYE SURGERY; Right     Comment:  cataract extraction  BMI    Body Mass Index: 43.74 kg/m      Reproductive/Obstetrics negative OB ROS                            Anesthesia Physical Anesthesia Plan  ASA: III  Anesthesia Plan: MAC   Post-op Pain Management:    Induction:   PONV Risk Score and Plan:   Airway Management Planned: Natural Airway and Nasal Cannula  Additional Equipment:   Intra-op  Plan:   Post-operative Plan:   Informed Consent: I have reviewed the patients History and Physical, chart, labs and discussed the procedure including the risks, benefits and alternatives for the proposed anesthesia with the patient or authorized representative who has indicated his/her understanding and acceptance.       Plan Discussed with: Anesthesiologist and CRNA  Anesthesia Plan Comments:         Anesthesia Quick Evaluation

## 2020-03-10 ENCOUNTER — Encounter: Payer: Self-pay | Admitting: Ophthalmology

## 2020-04-06 ENCOUNTER — Ambulatory Visit (INDEPENDENT_AMBULATORY_CARE_PROVIDER_SITE_OTHER): Payer: Medicare Other | Admitting: Podiatry

## 2020-04-06 ENCOUNTER — Other Ambulatory Visit: Payer: Self-pay

## 2020-04-06 ENCOUNTER — Encounter: Payer: Self-pay | Admitting: Podiatry

## 2020-04-06 DIAGNOSIS — M79675 Pain in left toe(s): Secondary | ICD-10-CM | POA: Diagnosis not present

## 2020-04-06 DIAGNOSIS — M2040 Other hammer toe(s) (acquired), unspecified foot: Secondary | ICD-10-CM | POA: Diagnosis not present

## 2020-04-06 DIAGNOSIS — M79674 Pain in right toe(s): Secondary | ICD-10-CM

## 2020-04-06 DIAGNOSIS — B351 Tinea unguium: Secondary | ICD-10-CM | POA: Diagnosis not present

## 2020-04-06 NOTE — Progress Notes (Signed)
This patient returns to the office for evaluation and treatment of long thick painful nails .  This patient is unable to trim her own nails since the patient cannot reach the feet.  Patient says the nails are painful walking and wearing her shoes.  She returns for preventive foot care services.  General Appearance  Alert, conversant and in no acute stress.  Vascular  Dorsalis pedis and posterior tibial  pulses are palpable  bilaterally.  Capillary return is within normal limits  bilaterally. Temperature is within normal limits  bilaterally.  Neurologic  Senn-Weinstein monofilament wire test within normal limits  bilaterally. Muscle power within normal limits bilaterally.  Nails Thick disfigured discolored nails with subungual debris  from hallux to fifth toes bilaterally. No evidence of bacterial infection or drainage bilaterally.  Orthopedic  No limitations of motion  feet .  No crepitus or effusions noted.  No bony pathology or digital deformities noted.  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  Skin  normotropic skin with no porokeratosis noted bilaterally.  No signs of infections or ulcers noted.     Onychomycosis  Pain in toes right foot  Pain in toes left foot  Debridement  of nails  1-5  B/L with a nail nipper.  Nails were then filed using a dremel tool with no incidents.  Coban dispensed for strapping her hammer toes as well separator.  RTC  3 months   Helane Gunther DPM

## 2020-06-19 ENCOUNTER — Other Ambulatory Visit: Payer: Self-pay

## 2020-06-19 ENCOUNTER — Ambulatory Visit (INDEPENDENT_AMBULATORY_CARE_PROVIDER_SITE_OTHER): Payer: Medicare Other | Admitting: Urology

## 2020-06-19 ENCOUNTER — Encounter: Payer: Self-pay | Admitting: Urology

## 2020-06-19 VITALS — BP 173/83 | HR 77 | Ht 63.0 in | Wt 250.0 lb

## 2020-06-19 DIAGNOSIS — R32 Unspecified urinary incontinence: Secondary | ICD-10-CM | POA: Diagnosis not present

## 2020-06-19 DIAGNOSIS — N3946 Mixed incontinence: Secondary | ICD-10-CM | POA: Diagnosis not present

## 2020-06-19 LAB — BLADDER SCAN AMB NON-IMAGING

## 2020-06-19 NOTE — Progress Notes (Signed)
06/19/2020 3:07 PM   Danielle [name] [birth year] [id number]  Referring provider: Hillery Aldo, MD [address] [place],  [place] [address]  Chief Complaint  Patient presents with  . Urinary Incontinence    HPI: I was consulted to assess the patient urinary continence.  Patient saw Dr. Kathie Rhodes for years ago and had failed Vesicare oxybutynin and Myrbetriq.  She has urge incontinence.  No stress incontinence.  Moderately severe bedwetting.  Wears to the 3 pads a day sometimes moderately wet but it varies.  She voids every 2-3 hours and gets up 2 or 3 times at night.  Flow was good.  Has had a hysterectomy.  Can walk slowly with 2 canes  No other neurologic issues.  No infection history.  No bladder surgery.    PMH: Past Medical History:  Diagnosis Date  . Anxiety   . Arthritis   . Depression   . Dyspnea    with exertion  . Headache    migraines  . History of myocarditis   . Hyperlipemia   . Hypertension   . Hypocholesterolemia   . Hypothyroidism   . Myocardial infarction ([facility]) 2010  . Sleep apnea    uses a cpap    Surgical History: Past Surgical History:  Procedure Laterality Date  . ABDOMINAL HYSTERECTOMY    . APPENDECTOMY    . CATARACT EXTRACTION W/PHACO Left 03/09/2020   Procedure: CATARACT EXTRACTION PHACO AND INTRAOCULAR LENS PLACEMENT (IOC) LEFT;  Surgeon: Lockie Mola, MD;  Location: [facility] ORS;  Service: Ophthalmology;  Laterality: Left;  cde 9.56 us1:32.8 ap10.3%  . EYE SURGERY Right    cataract extraction    Home Medications:  Allergies as of 06/19/2020      Reactions   Ace Inhibitors Other (See Comments)   Angiodema to Ace-inhibitor 2013   Penicillins Rash   SEVERE RASH. Has patient had a PCN reaction causing immediate rash, facial/tongue/throat swelling, SOB or lightheadedness with hypotension: Unknown Has patient had a PCN reaction causing severe rash involving mucus membranes or skin necrosis: Unknown Has patient had a PCN  reaction that required hospitalization: Unknown Has patient had a PCN reaction occurring within the last 10 years: Unknown If all of the above answers are "NO", then may proceed with Cephalosporin use. Unknown   Penicillin V Potassium Nausea And Vomiting, Rash      Medication List       Accurate as of June 19, 2020  3:07 PM. If you have any questions, ask your nurse or doctor.        acetaminophen 500 MG tablet Commonly known as: TYLENOL 1,000 mg every 8 (eight) hours as needed for moderate pain.   aspirin EC 81 MG tablet Take 1 tablet by mouth daily.   donepezil 5 MG tablet Commonly known as: ARICEPT Take 5 mg by mouth at bedtime.   hydrochlorothiazide 25 MG tablet Commonly known as: HYDRODIURIL Take 25 mg by mouth daily.   isosorbide mononitrate 30 MG 24 hr tablet Commonly known as: IMDUR Take 30 mg by mouth daily.   levothyroxine 200 MCG tablet Commonly known as: SYNTHROID Take 200 mcg by mouth daily before breakfast.   Mag-Al Plus XS 400-400-40 MG/5ML suspension Generic drug: alum & mag hydroxide-simeth Take 15 mLs by mouth as needed for indigestion.   metoprolol succinate 50 MG 24 hr tablet Commonly known as: TOPROL-XL Take 50 mg by mouth daily. Take with or immediately following a meal (after breakfast)   naproxen sodium 220 MG tablet  Commonly known as: ALEVE Take 220 mg by mouth 2 (two) times daily as needed (pain).   nitroGLYCERIN 0.4 MG SL tablet Commonly known as: NITROSTAT Place 0.4 mg under the tongue every 5 (five) minutes as needed for chest pain.   polyethylene glycol powder 17 GM/SCOOP powder Commonly known as: GLYCOLAX/MIRALAX Take 17 g by mouth daily as needed for moderate constipation.   potassium chloride 10 MEQ tablet Commonly known as: KLOR-CON Take 10 mEq by mouth 2 (two) times daily.   sertraline 100 MG tablet Commonly known as: ZOLOFT Take 100 mg by mouth daily.   Vitamin D (Ergocalciferol) 1.25 MG (50000 UNIT) Caps  capsule Commonly known as: DRISDOL Take 50,000 Units by mouth every Tuesday.       Allergies:  Allergies  Allergen Reactions  . Ace Inhibitors Other (See Comments)    Angiodema to Ace-inhibitor 2013  . Penicillins Rash    SEVERE RASH. Has patient had a PCN reaction causing immediate rash, facial/tongue/throat swelling, SOB or lightheadedness with hypotension: Unknown Has patient had a PCN reaction causing severe rash involving mucus membranes or skin necrosis: Unknown Has patient had a PCN reaction that required hospitalization: Unknown Has patient had a PCN reaction occurring within the last 10 years: Unknown If all of the above answers are "NO", then may proceed with Cephalosporin use. Unknown   . Penicillin V Potassium Nausea And Vomiting and Rash    Family History: Family History  Problem Relation Age of Onset  . Bladder Cancer Neg Hx   . Kidney cancer Neg Hx   . Breast cancer Neg Hx     Social History:  reports that she has never smoked. She has never used smokeless tobacco. She reports that she does not drink alcohol and does not use drugs.  ROS:                                        Physical Exam: There were no vitals taken for this visit.  Constitutional:  Alert and oriented, No acute distress.   Laboratory Data: Lab Results  Component Value Date   WBC 5.0 08/29/2017   HGB 12.5 08/29/2017   HCT 37.6 08/29/2017   MCV 90.6 08/29/2017   PLT 216 08/29/2017    Lab Results  Component Value Date   CREATININE 0.85 08/29/2017    No results found for: PSA  No results found for: TESTOSTERONE  No results found for: HGBA1C  Urinalysis    Component Value Date/Time   COLORURINE YELLOW (A) 08/29/2017 1543   APPEARANCEUR CLEAR (A) 08/29/2017 1543   APPEARANCEUR Clear 07/03/2017 1342   LABSPEC 1.023 08/29/2017 1543   LABSPEC 1.023 08/09/2012 1758   PHURINE 7.0 08/29/2017 1543   GLUCOSEU NEGATIVE 08/29/2017 1543   GLUCOSEU  Negative 08/09/2012 1758   HGBUR NEGATIVE 08/29/2017 1543   BILIRUBINUR NEGATIVE 08/29/2017 1543   BILIRUBINUR Negative 07/03/2017 1342   BILIRUBINUR Negative 08/09/2012 1758   KETONESUR 20 (A) 08/29/2017 1543   PROTEINUR NEGATIVE 08/29/2017 1543   NITRITE NEGATIVE 08/29/2017 1543   LEUKOCYTESUR NEGATIVE 08/29/2017 1543   LEUKOCYTESUR Negative 07/03/2017 1342   LEUKOCYTESUR Trace 08/09/2012 1758    Pertinent Imaging: Not reviewed.  Urine reviewed.  Urine sent for culture  Assessment & Plan: I would like to see the patient back on the new beta 3 agonist samples in 6 weeks for pelvic examination and cystoscopy.  I gave  her several weeks of samples.  I think we will probably will need to have reasonable treatment goals and likely percutaneous tibial nerve stimulation will be the next option.  Call if urine culture is positive.  I will not order urodynamics at this stage  1. Urinary incontinence, unspecified type  - Urinalysis, Complete - BLADDER SCAN AMB NON-IMAGING   No follow-ups on file.  Martina Sinner, MD  [facility] [address] [place], [place] [address] [phone]

## 2020-06-20 LAB — MICROSCOPIC EXAMINATION

## 2020-06-20 LAB — URINALYSIS, COMPLETE
Bilirubin, UA: NEGATIVE
Glucose, UA: NEGATIVE
Leukocytes,UA: NEGATIVE
Nitrite, UA: NEGATIVE
Protein,UA: NEGATIVE
RBC, UA: NEGATIVE
Specific Gravity, UA: 1.025 (ref 1.005–1.030)
Urobilinogen, Ur: 0.2 mg/dL (ref 0.2–1.0)
pH, UA: 5.5 (ref 5.0–7.5)

## 2020-06-24 LAB — CULTURE, URINE COMPREHENSIVE

## 2020-06-30 ENCOUNTER — Telehealth: Payer: Self-pay

## 2020-06-30 NOTE — Telephone Encounter (Signed)
Has run out of options  Follow up in clinic

## 2020-06-30 NOTE — Telephone Encounter (Signed)
Spoke with patient and daughter pt is having a reaction to the gemteza.  She has been breaking out in hives. I advised pt to stop medication immediately. Pt wants to know if there is something else she can take as the medication was not helping her symptoms any way. Patient denies any difficulty breathing, swallowing, etc.

## 2020-07-10 ENCOUNTER — Ambulatory Visit (INDEPENDENT_AMBULATORY_CARE_PROVIDER_SITE_OTHER): Payer: Medicare Other | Admitting: Podiatry

## 2020-07-10 ENCOUNTER — Encounter: Payer: Self-pay | Admitting: Podiatry

## 2020-07-10 ENCOUNTER — Other Ambulatory Visit: Payer: Self-pay

## 2020-07-10 DIAGNOSIS — B351 Tinea unguium: Secondary | ICD-10-CM | POA: Diagnosis not present

## 2020-07-10 DIAGNOSIS — M79674 Pain in right toe(s): Secondary | ICD-10-CM | POA: Diagnosis not present

## 2020-07-10 DIAGNOSIS — M2041 Other hammer toe(s) (acquired), right foot: Secondary | ICD-10-CM

## 2020-07-10 DIAGNOSIS — M79675 Pain in left toe(s): Secondary | ICD-10-CM | POA: Diagnosis not present

## 2020-07-10 DIAGNOSIS — M2040 Other hammer toe(s) (acquired), unspecified foot: Secondary | ICD-10-CM | POA: Diagnosis not present

## 2020-07-10 DIAGNOSIS — M2011 Hallux valgus (acquired), right foot: Secondary | ICD-10-CM | POA: Diagnosis not present

## 2020-07-10 DIAGNOSIS — M2010 Hallux valgus (acquired), unspecified foot: Secondary | ICD-10-CM | POA: Diagnosis not present

## 2020-07-10 NOTE — Progress Notes (Signed)
This patient returns to the office for evaluation and treatment of long thick painful nails .  This patient is unable to trim her own nails since the patient cannot reach the feet.  Patient says the nails are painful walking and wearing her shoes.  She returns for preventive foot care services.  General Appearance  Alert, conversant and in no acute stress.  Vascular  Dorsalis pedis and posterior tibial  pulses are palpable  bilaterally.  Capillary return is within normal limits  bilaterally. Temperature is within normal limits  bilaterally.  Neurologic  Senn-Weinstein monofilament wire test within normal limits  bilaterally. Muscle power within normal limits bilaterally.  Nails Thick disfigured discolored nails with subungual debris  from hallux to fifth toes bilaterally. No evidence of bacterial infection or drainage bilaterally.  Orthopedic  No limitations of motion  feet .  No crepitus or effusions noted.  No bony pathology or digital deformities noted.  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  Skin  normotropic skin with no porokeratosis noted bilaterally.  No signs of infections or ulcers noted.     Onychomycosis  Pain in toes right foot  Pain in toes left foot  Debridement  of nails  1-5  B/L with a nail nipper.  Nails were then filed using a dremel tool with no incidents.   RTC  3 months   Taneesha Edgin DPM  

## 2020-07-31 ENCOUNTER — Ambulatory Visit (INDEPENDENT_AMBULATORY_CARE_PROVIDER_SITE_OTHER): Payer: Medicare Other | Admitting: Urology

## 2020-07-31 ENCOUNTER — Other Ambulatory Visit: Payer: Self-pay

## 2020-07-31 ENCOUNTER — Encounter: Payer: Self-pay | Admitting: Urology

## 2020-07-31 VITALS — BP 191/78 | HR 77 | Ht 62.0 in | Wt 247.0 lb

## 2020-07-31 DIAGNOSIS — N3946 Mixed incontinence: Secondary | ICD-10-CM | POA: Diagnosis not present

## 2020-07-31 DIAGNOSIS — R32 Unspecified urinary incontinence: Secondary | ICD-10-CM | POA: Diagnosis not present

## 2020-07-31 NOTE — Patient Instructions (Signed)

## 2020-07-31 NOTE — Progress Notes (Signed)
07/31/2020 2:16 PM   Danielle [name] 09-05-1928 [id number]  Referring provider: Hillery Aldo, MD [address] [place],  [place] [address]  Chief Complaint  Patient presents with  . Cysto    HPI: I was consulted to assess the patient urinary continence.  Patient saw Dr. Kathie Rhodes for years ago and had failed Vesicare oxybutynin and Myrbetriq.  She has urge incontinence.  No stress incontinence.  Moderately severe bedwetting.  Wears to the 3 pads a day sometimes moderately wet but it varies.  She voids every 2-3 hours and gets up 2 or 3 times at night.  Flow was good.  Has had a hysterectomy.  Can walk slowly with 2 canes  I would like to see the patient back on the new beta 3 agonist samples in 6 weeks for pelvic examination and cystoscopy.  I gave her several weeks of samples.  I think we will probably will need to have reasonable treatment goals and likely percutaneous tibial nerve stimulation will be the next option.  Call if urine culture is positive.  I will not order urodynamics at this stage  Theodoro Grist Frequency stable.  Culture negative. No benefit from new beta 3 agonists.  Clinically not infected Cystoscopy patient underwent flexible cystoscopy utilizing sterile technique.  Bladder mucosa and trigone were normal.  Urine did not look infected.  Orifices normal. No significant prolapse or stress incontinence on pelvic semination    PMH: Past Medical History:  Diagnosis Date  . Anxiety   . Arthritis   . Depression   . Dyspnea    with exertion  . Headache    migraines  . History of myocarditis   . Hyperlipemia   . Hypertension   . Hypocholesterolemia   . Hypothyroidism   . Myocardial infarction (HCC) 2010  . Sleep apnea    uses a cpap    Surgical History: Past Surgical History:  Procedure Laterality Date  . ABDOMINAL HYSTERECTOMY    . APPENDECTOMY    . CATARACT EXTRACTION W/PHACO Left 03/09/2020   Procedure: CATARACT EXTRACTION PHACO AND INTRAOCULAR  LENS PLACEMENT (IOC) LEFT;  Surgeon: Lockie Mola, MD;  Location: [facility] ORS;  Service: Ophthalmology;  Laterality: Left;  cde 9.56 us1:32.8 ap10.3%  . EYE SURGERY Right    cataract extraction    Home Medications:  Allergies as of 07/31/2020      Reactions   Ace Inhibitors Other (See Comments)   Angiodema to Ace-inhibitor 2013   Penicillins Rash   SEVERE RASH. Has patient had a PCN reaction causing immediate rash, facial/tongue/throat swelling, SOB or lightheadedness with hypotension: Unknown Has patient had a PCN reaction causing severe rash involving mucus membranes or skin necrosis: Unknown Has patient had a PCN reaction that required hospitalization: Unknown Has patient had a PCN reaction occurring within the last 10 years: Unknown If all of the above answers are "NO", then may proceed with Cephalosporin use. Unknown   Penicillin V Potassium Nausea And Vomiting, Rash      Medication List       Accurate as of July 31, 2020  2:16 PM. If you have any questions, ask your nurse or doctor.        acetaminophen 500 MG tablet Commonly known as: TYLENOL 1,000 mg every 8 (eight) hours as needed for moderate pain.   aspirin EC 81 MG tablet Take 1 tablet by mouth daily.   donepezil 5 MG tablet Commonly known as: ARICEPT Take 5 mg by mouth at bedtime.  hydrochlorothiazide 25 MG tablet Commonly known as: HYDRODIURIL Take 25 mg by mouth daily.   isosorbide mononitrate 30 MG 24 hr tablet Commonly known as: IMDUR Take 30 mg by mouth daily.   levothyroxine 200 MCG tablet Commonly known as: SYNTHROID Take 200 mcg by mouth daily before breakfast.   Mag-Al Plus XS 400-400-40 MG/5ML suspension Generic drug: alum & mag hydroxide-simeth Take 15 mLs by mouth as needed for indigestion.   metoprolol succinate 50 MG 24 hr tablet Commonly known as: TOPROL-XL Take 50 mg by mouth daily. Take with or immediately following a meal (after breakfast)   naproxen sodium 220 MG  tablet Commonly known as: ALEVE Take 220 mg by mouth 2 (two) times daily as needed (pain).   nitroGLYCERIN 0.4 MG SL tablet Commonly known as: NITROSTAT Place 0.4 mg under the tongue every 5 (five) minutes as needed for chest pain.   polyethylene glycol powder 17 GM/SCOOP powder Commonly known as: GLYCOLAX/MIRALAX Take 17 g by mouth daily as needed for moderate constipation.   potassium chloride 10 MEQ tablet Commonly known as: KLOR-CON Take 10 mEq by mouth 2 (two) times daily.   ranolazine 500 MG 12 hr tablet Commonly known as: RANEXA Take by mouth.   sertraline 100 MG tablet Commonly known as: ZOLOFT Take 100 mg by mouth daily.   Vitamin D (Ergocalciferol) 1.25 MG (50000 UNIT) Caps capsule Commonly known as: DRISDOL Take 50,000 Units by mouth every Tuesday.       Allergies:  Allergies  Allergen Reactions  . Ace Inhibitors Other (See Comments)    Angiodema to Ace-inhibitor 2013  . Penicillins Rash    SEVERE RASH. Has patient had a PCN reaction causing immediate rash, facial/tongue/throat swelling, SOB or lightheadedness with hypotension: Unknown Has patient had a PCN reaction causing severe rash involving mucus membranes or skin necrosis: Unknown Has patient had a PCN reaction that required hospitalization: Unknown Has patient had a PCN reaction occurring within the last 10 years: Unknown If all of the above answers are "NO", then may proceed with Cephalosporin use. Unknown   . Penicillin V Potassium Nausea And Vomiting and Rash    Family History: Family History  Problem Relation Age of Onset  . Bladder Cancer Neg Hx   . Kidney cancer Neg Hx   . Breast cancer Neg Hx     Social History:  reports that she has never smoked. She has never used smokeless tobacco. She reports that she does not drink alcohol and does not use drugs.  ROS:                                        Physical Exam:  Laboratory Data: Lab Results  Component  Value Date   WBC 5.0 08/29/2017   HGB 12.5 08/29/2017   HCT 37.6 08/29/2017   MCV 90.6 08/29/2017   PLT 216 08/29/2017    Lab Results  Component Value Date   CREATININE 0.85 08/29/2017    No results found for: PSA  No results found for: TESTOSTERONE  No results found for: HGBA1C  Urinalysis    Component Value Date/Time   COLORURINE YELLOW (A) 08/29/2017 1543   APPEARANCEUR Cloudy (A) 06/19/2020 1510   LABSPEC 1.023 08/29/2017 1543   LABSPEC 1.023 08/09/2012 1758   PHURINE 7.0 08/29/2017 1543   GLUCOSEU Negative 06/19/2020 1510   GLUCOSEU Negative 08/09/2012 1758   HGBUR NEGATIVE 08/29/2017 1543  BILIRUBINUR Negative 06/19/2020 1510   BILIRUBINUR Negative 08/09/2012 1758   KETONESUR 20 (A) 08/29/2017 1543   PROTEINUR Negative 06/19/2020 1510   PROTEINUR NEGATIVE 08/29/2017 1543   NITRITE Negative 06/19/2020 1510   NITRITE NEGATIVE 08/29/2017 1543   LEUKOCYTESUR Negative 06/19/2020 1510   LEUKOCYTESUR Trace 08/09/2012 1758    Pertinent Imaging:   Assessment & Plan: Patient is exhausted medication of overactive bladder.  Percutaneous tibial nerve stimulation with usual template discussed.  Handout given.  Patient will call if she wishes to have them.  She is elderly with a functional component and we need to have reasonable goals.  Call if urine culture is positive otherwise see as needed  1. Urinary incontinence, unspecified type  - Urinalysis, Complete   No follow-ups on file.  Martina Sinner, MD  [facility] [address], [address] [place], [place] [address] [phone]

## 2020-08-01 ENCOUNTER — Telehealth: Payer: Self-pay

## 2020-08-01 LAB — MICROSCOPIC EXAMINATION
Bacteria, UA: NONE SEEN
WBC, UA: NONE SEEN /hpf (ref 0–5)

## 2020-08-01 LAB — URINALYSIS, COMPLETE
Bilirubin, UA: NEGATIVE
Glucose, UA: NEGATIVE
Ketones, UA: NEGATIVE
Leukocytes,UA: NEGATIVE
Nitrite, UA: NEGATIVE
Protein,UA: NEGATIVE
Specific Gravity, UA: 1.015 (ref 1.005–1.030)
Urobilinogen, Ur: 0.2 mg/dL (ref 0.2–1.0)
pH, UA: 6.5 (ref 5.0–7.5)

## 2020-08-01 NOTE — Telephone Encounter (Signed)
No prior auth needed. We can schedule. Can Start on 09/04/2020 @ 130

## 2020-08-01 NOTE — Telephone Encounter (Signed)
Patient would like to start PTNS on Monday or Tuesday afternoons if possible. Please check insurance for PA and contact patient  thanks

## 2020-08-02 LAB — CULTURE, URINE COMPREHENSIVE

## 2020-09-02 DIAGNOSIS — J449 Chronic obstructive pulmonary disease, unspecified: Secondary | ICD-10-CM | POA: Insufficient documentation

## 2020-09-04 ENCOUNTER — Ambulatory Visit: Payer: Self-pay

## 2020-09-11 ENCOUNTER — Other Ambulatory Visit: Payer: Self-pay

## 2020-09-11 ENCOUNTER — Ambulatory Visit (INDEPENDENT_AMBULATORY_CARE_PROVIDER_SITE_OTHER): Payer: Medicare Other

## 2020-09-11 DIAGNOSIS — N3946 Mixed incontinence: Secondary | ICD-10-CM

## 2020-09-11 NOTE — Patient Instructions (Signed)
Tracking Your Bladder Symptoms    Patient Name:___________________________________________________   Sample: Day   Daytime Voids  Nighttime Voids Urgency for the Day(0-4) Number of Accidents Beverage Comments  Monday IIII II 2 I Water IIII Coffee  I      Week Starting:____________________________________   Day Daytime  Voids Nighttime  Voids Urgency for  The Day(0-4) Number of Accidents Beverages Comments                                                           This week my symptoms were:  O much better  O better O the same O worse   

## 2020-09-11 NOTE — Progress Notes (Signed)
PTNS  Session # 1  Health & Social Factors: no change Caffeine: 1-2 Alcohol: 0 Daytime voids #per day: 4-5 Night-time voids #per night: 2-3 Urgency: strong Incontinence Episodes #per day: 5-6 Ankle used: right Treatment Setting: 9 Feeling/ Response: both Comments: Patient tolerated well  Performed By: Eligha Bridegroom, CMA   Follow Up: 1 week

## 2020-09-18 ENCOUNTER — Ambulatory Visit: Payer: Self-pay

## 2020-09-25 ENCOUNTER — Other Ambulatory Visit: Payer: Self-pay

## 2020-09-25 ENCOUNTER — Ambulatory Visit (INDEPENDENT_AMBULATORY_CARE_PROVIDER_SITE_OTHER): Payer: Medicare Other

## 2020-09-25 DIAGNOSIS — N3946 Mixed incontinence: Secondary | ICD-10-CM | POA: Diagnosis not present

## 2020-09-25 NOTE — Patient Instructions (Signed)
Tracking Your Bladder Symptoms    Patient Name:___________________________________________________   Sample: Day   Daytime Voids  Nighttime Voids Urgency for the Day(0-4) Number of Accidents Beverage Comments  Monday IIII II 2 I Water IIII Coffee  I      Week Starting:____________________________________   Day Daytime  Voids Nighttime  Voids Urgency for  The Day(0-4) Number of Accidents Beverages Comments                                                           This week my symptoms were:  O much better  O better O the same O worse   

## 2020-09-25 NOTE — Progress Notes (Signed)
PTNS  Session # 2  Health & Social Factors: No Change  Caffeine: 1-2 Alcohol: 0 Daytime voids #per day: 3-4 Night-time voids #per night: 2-3 Urgency: Strong Incontinence Episodes #per day: 5-6 Ankle used: Left Treatment Setting: 6 Feeling/ Response: Sensory & Toe Flex Comments: Pt forgot voiding diary at home.  Performed By: Debbe Bales, CMA   Follow Up: RTC in 1 week

## 2020-10-02 ENCOUNTER — Other Ambulatory Visit: Payer: Self-pay

## 2020-10-02 ENCOUNTER — Ambulatory Visit (INDEPENDENT_AMBULATORY_CARE_PROVIDER_SITE_OTHER): Payer: Medicare Other | Admitting: *Deleted

## 2020-10-02 DIAGNOSIS — N3946 Mixed incontinence: Secondary | ICD-10-CM

## 2020-10-02 NOTE — Progress Notes (Signed)
PTNS  Session # 3  Health & Social Factors: No Change  Caffeine: 1-2 Alcohol: 0 Daytime voids #per day: 3-4 Night-time voids #per night: 2-3 Urgency: Strong Incontinence Episodes #per day: 5-6 Ankle used: Right Treatment Setting: 4 Feeling/ Response: Sensory & Toe Flex Comments:e.  Performed By: Ples Specter CMA   Follow Up: RTC in 1 week

## 2020-10-09 ENCOUNTER — Ambulatory Visit (INDEPENDENT_AMBULATORY_CARE_PROVIDER_SITE_OTHER): Payer: Medicare Other

## 2020-10-09 ENCOUNTER — Other Ambulatory Visit: Payer: Self-pay

## 2020-10-09 DIAGNOSIS — N3946 Mixed incontinence: Secondary | ICD-10-CM

## 2020-10-09 NOTE — Progress Notes (Signed)
PTNS  Session # 4  Health & Social Factors: no change Caffeine: 1 Alcohol: 0 Daytime voids #per day: 4 Night-time voids #per night: 3-4 Urgency: moderate Incontinence Episodes #per day: 7 Ankle used: left Treatment Setting: 7 Feeling/ Response: both Comments: patient tolerated well  Performed By: Eligha Bridegroom, CMA   Follow Up: 1 week

## 2020-10-09 NOTE — Patient Instructions (Signed)
Tracking Your Bladder Symptoms    Patient Name:___________________________________________________   Sample: Day   Daytime Voids  Nighttime Voids Urgency for the Day(0-4) Number of Accidents Beverage Comments  Monday IIII II 2 I Water IIII Coffee  I      Week Starting:____________________________________   Day Daytime  Voids Nighttime  Voids Urgency for  The Day(0-4) Number of Accidents Beverages Comments                                                           This week my symptoms were:  O much better  O better O the same O worse   

## 2020-10-12 ENCOUNTER — Other Ambulatory Visit: Payer: Self-pay

## 2020-10-12 ENCOUNTER — Encounter: Payer: Self-pay | Admitting: Podiatry

## 2020-10-12 ENCOUNTER — Ambulatory Visit (INDEPENDENT_AMBULATORY_CARE_PROVIDER_SITE_OTHER): Payer: Medicare Other | Admitting: Podiatry

## 2020-10-12 DIAGNOSIS — M79675 Pain in left toe(s): Secondary | ICD-10-CM | POA: Diagnosis not present

## 2020-10-12 DIAGNOSIS — M2011 Hallux valgus (acquired), right foot: Secondary | ICD-10-CM | POA: Diagnosis not present

## 2020-10-12 DIAGNOSIS — M79674 Pain in right toe(s): Secondary | ICD-10-CM

## 2020-10-12 DIAGNOSIS — M2040 Other hammer toe(s) (acquired), unspecified foot: Secondary | ICD-10-CM

## 2020-10-12 DIAGNOSIS — B351 Tinea unguium: Secondary | ICD-10-CM | POA: Diagnosis not present

## 2020-10-12 NOTE — Progress Notes (Signed)
This patient returns to the office for evaluation and treatment of long thick painful nails .  This patient is unable to trim her own nails since the patient cannot reach the feet.  Patient says the nails are painful walking and wearing her shoes.  She returns for preventive foot care services.  General Appearance  Alert, conversant and in no acute stress.  Vascular  Dorsalis pedis and posterior tibial  pulses are palpable  bilaterally.  Capillary return is within normal limits  bilaterally. Temperature is within normal limits  bilaterally.  Neurologic  Senn-Weinstein monofilament wire test within normal limits  bilaterally. Muscle power within normal limits bilaterally.  Nails Thick disfigured discolored nails with subungual debris  from hallux to fifth toes bilaterally. No evidence of bacterial infection or drainage bilaterally.  Orthopedic  No limitations of motion  feet .  No crepitus or effusions noted.  No bony pathology or digital deformities noted.  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  Skin  normotropic skin with no porokeratosis noted bilaterally.  No signs of infections or ulcers noted.     Onychomycosis  Pain in toes right foot  Pain in toes left foot  Debridement  of nails  1-5  B/L with a nail nipper.  Nails were then filed using a dremel tool with no incidents.   RTC  3 months   Helane Gunther DPM

## 2020-10-16 ENCOUNTER — Ambulatory Visit (INDEPENDENT_AMBULATORY_CARE_PROVIDER_SITE_OTHER): Payer: Medicare Other | Admitting: Family Medicine

## 2020-10-16 ENCOUNTER — Other Ambulatory Visit: Payer: Self-pay

## 2020-10-16 DIAGNOSIS — N3946 Mixed incontinence: Secondary | ICD-10-CM | POA: Diagnosis not present

## 2020-10-16 NOTE — Progress Notes (Signed)
PTNS  Session # 5  Health & Social Factors: no change Caffeine: 1 Alcohol: 0 Daytime voids #per day: 4 Night-time voids #per night: 2-3 Urgency: strong Incontinence Episodes #per day: 3 Ankle used: right Treatment Setting: 3 Feeling/ Response: Senory Comments: Patient tolerated well  Performed By: Teressa Lower, CMA  Follow Up: 1 week #6

## 2020-10-23 ENCOUNTER — Encounter: Payer: Self-pay | Admitting: Hospice and Palliative Medicine

## 2020-10-23 ENCOUNTER — Ambulatory Visit (INDEPENDENT_AMBULATORY_CARE_PROVIDER_SITE_OTHER): Payer: Medicare Other | Admitting: Hospice and Palliative Medicine

## 2020-10-23 ENCOUNTER — Ambulatory Visit (INDEPENDENT_AMBULATORY_CARE_PROVIDER_SITE_OTHER): Payer: Medicare Other

## 2020-10-23 ENCOUNTER — Other Ambulatory Visit: Payer: Self-pay

## 2020-10-23 VITALS — BP 162/68 | HR 73 | Temp 97.3°F | Resp 16 | Ht 63.0 in

## 2020-10-23 DIAGNOSIS — Z9989 Dependence on other enabling machines and devices: Secondary | ICD-10-CM

## 2020-10-23 DIAGNOSIS — N3946 Mixed incontinence: Secondary | ICD-10-CM | POA: Diagnosis not present

## 2020-10-23 DIAGNOSIS — Z7189 Other specified counseling: Secondary | ICD-10-CM | POA: Diagnosis not present

## 2020-10-23 DIAGNOSIS — I2510 Atherosclerotic heart disease of native coronary artery without angina pectoris: Secondary | ICD-10-CM

## 2020-10-23 DIAGNOSIS — G4733 Obstructive sleep apnea (adult) (pediatric): Secondary | ICD-10-CM

## 2020-10-23 NOTE — Progress Notes (Signed)
[facility] [address] [place], [place] [address]  Pulmonary Sleep Medicine   Office Visit Note  Patient Name: Danielle [name] DOB: 1929-03-07 MRN [id number]  Date of Service: 10/24/2020  Complaints/HPI: Patient is here for routine pulmonary follow-up Followed for OSA on CPAP--reports nightly compliance with her CPAP Has not had a recent download--wanting to see if she is eligible for a new CPAP Mentioned this at her last download appointment and has not heard anything about getting a new machine Current machine is still working, will shut off at times but restarts after she unplugs it from outlet and plugs it back in Denies congestion, nasal dryness or headaches upon awakening Sleep well at night, averages about 7-8 hours of sleep per night  ROS  General: (-) fever, (-) chills, (-) night sweats, (-) weakness Skin: (-) rashes, (-) itching,. Eyes: (-) visual changes, (-) redness, (-) itching. Nose and Sinuses: (-) nasal stuffiness or itchiness, (-) postnasal drip, (-) nosebleeds, (-) sinus trouble. Mouth and Throat: (-) sore throat, (-) hoarseness. Neck: (-) swollen glands, (-) enlarged thyroid, (-) neck pain. Respiratory: - cough, (-) bloody sputum, - shortness of breath, - wheezing. Cardiovascular: - ankle swelling, (-) chest pain. Lymphatic: (-) lymph node enlargement. Neurologic: (-) numbness, (-) tingling. Psychiatric: (-) anxiety, (-) depression   Current Medication: Outpatient Encounter Medications as of 10/23/2020  Medication Sig  . acetaminophen (TYLENOL) 500 MG tablet 1,000 mg every 8 (eight) hours as needed for moderate pain.   Marland Kitchen alum & mag hydroxide-simeth (MAALOX PLUS) 400-400-40 MG/5ML suspension Take 15 mLs by mouth as needed for indigestion.   Marland Kitchen aspirin EC 81 MG tablet Take 1 tablet by mouth daily.  Marland Kitchen donepezil (ARICEPT) 5 MG tablet Take 5 mg by mouth at bedtime.  . hydrochlorothiazide (HYDRODIURIL) 25 MG tablet Take 25 mg by mouth daily.  .  isosorbide mononitrate (IMDUR) 30 MG 24 hr tablet Take 30 mg by mouth daily.   Marland Kitchen levothyroxine (SYNTHROID) 200 MCG tablet Take 200 mcg by mouth daily before breakfast.   . metoprolol succinate (TOPROL-XL) 50 MG 24 hr tablet Take 50 mg by mouth daily. Take with or immediately following a meal (after breakfast)  . naproxen sodium (ALEVE) 220 MG tablet Take 220 mg by mouth 2 (two) times daily as needed (pain).  . nitroGLYCERIN (NITROSTAT) 0.4 MG SL tablet Place 0.4 mg under the tongue every 5 (five) minutes as needed for chest pain.   . polyethylene glycol powder (GLYCOLAX/MIRALAX) powder Take 17 g by mouth daily as needed for moderate constipation.   . potassium chloride (K-DUR,KLOR-CON) 10 MEQ tablet Take 10 mEq by mouth 2 (two) times daily.   . ranolazine (RANEXA) 500 MG 12 hr tablet Take by mouth.  . sertraline (ZOLOFT) 100 MG tablet Take 100 mg by mouth daily.   . Vitamin D, Ergocalciferol, (DRISDOL) 1.25 MG (50000 UNIT) CAPS capsule Take 50,000 Units by mouth every Tuesday.   No facility-administered encounter medications on file as of 10/23/2020.    Surgical History: Past Surgical History:  Procedure Laterality Date  . ABDOMINAL HYSTERECTOMY    . APPENDECTOMY    . CATARACT EXTRACTION W/PHACO Left 03/09/2020   Procedure: CATARACT EXTRACTION PHACO AND INTRAOCULAR LENS PLACEMENT (IOC) LEFT;  Surgeon: Lockie Mola, MD;  Location: [facility] ORS;  Service: Ophthalmology;  Laterality: Left;  cde 9.56 us1:32.8 ap10.3%  . EYE SURGERY Right    cataract extraction    Medical History: Past Medical History:  Diagnosis Date  . Anxiety   .  Arthritis   . Depression   . Dyspnea    with exertion  . Headache    migraines  . History of myocarditis   . Hyperlipemia   . Hypertension   . Hypocholesterolemia   . Hypothyroidism   . Myocardial infarction (HCC) 2010  . Sleep apnea    uses a cpap    Family History: Family History  Problem Relation Age of Onset  . Bladder Cancer Neg Hx   .  Kidney cancer Neg Hx   . Breast cancer Neg Hx     Social History: Social History   Socioeconomic History  . Marital status: Widowed    Spouse name: Not on file  . Number of children: Not on file  . Years of education: Not on file  . Highest education level: Not on file  Occupational History  . Not on file  Tobacco Use  . Smoking status: Never Smoker  . Smokeless tobacco: Never Used  Vaping Use  . Vaping Use: Never used  Substance and Sexual Activity  . Alcohol use: No  . Drug use: No  . Sexual activity: Not Currently  Other Topics Concern  . Not on file  Social History Narrative  . Not on file   Social Determinants of Health   Financial Resource Strain: Not on file  Food Insecurity: Not on file  Transportation Needs: Not on file  Physical Activity: Not on file  Stress: Not on file  Social Connections: Not on file  Intimate Partner Violence: Not on file    Vital Signs: Blood pressure (!) 162/68, pulse 73, temperature (!) 97.3 F (36.3 C), resp. rate 16, height 5\' 3"  (1.6 m), SpO2 95 %.  Examination: General Appearance: The patient is well-developed, well-nourished, and in no distress. Skin: Gross inspection of skin unremarkable. Head: normocephalic, no gross deformities. Eyes: no gross deformities noted. ENT: ears appear grossly normal no exudates. Neck: Supple. No thyromegaly. No LAD. Respiratory: Clear throughout, no rhonchi, wheezing or rales noted. Cardiovascular: Normal S1 and S2 without murmur or rub. Extremities: No cyanosis. pulses are equal. Neurologic: Alert and oriented. No involuntary movements.  LABS: Recent Results (from the past 2160 hour(s))  Urinalysis, Complete     Status: Abnormal   Collection Time: 07/31/20  3:17 PM  Result Value Ref Range   Specific Gravity, UA 1.015 1.005 - 1.030   pH, UA 6.5 5.0 - 7.5   Color, UA Straw Yellow   Appearance Ur Clear Clear   Leukocytes,UA Negative Negative   Protein,UA Negative Negative/Trace    Glucose, UA Negative Negative   Ketones, UA Negative Negative   RBC, UA Trace (A) Negative   Bilirubin, UA Negative Negative   Urobilinogen, Ur 0.2 0.2 - 1.0 mg/dL   Nitrite, UA Negative Negative   Microscopic Examination See below:   CULTURE, URINE COMPREHENSIVE     Status: None   Collection Time: 07/31/20  3:17 PM   Specimen: Urine   UR  Result Value Ref Range   Urine Culture, Comprehensive Final report    Organism ID, Bacteria Comment     Comment: No growth in 36 - 48 hours.  Microscopic Examination     Status: None   Collection Time: 07/31/20  3:17 PM   Urine  Result Value Ref Range   WBC, UA None seen 0 - 5 /hpf   RBC 0-2 0 - 2 /hpf   Epithelial Cells (non renal) 0-10 0 - 10 /hpf   Bacteria, UA None seen None seen/Few  Radiology: No results found.  No results found.  No results found.    Assessment and Plan: Patient Active Problem List   Diagnosis Date Noted  . Pain due to onychomycosis of toenails of both feet 02/15/2019  . Urge incontinence of urine 07/03/2017  . Coronary arteriosclerosis in native artery 05/29/2014  . CAD (coronary artery disease), native coronary artery 05/29/2014  . Hyperlipidemia 05/29/2014  . Hypertension 05/29/2014  . Hyperlipidemia, unspecified 05/29/2014  . Pain in extremity 06/21/2013  . Onychomycosis due to dermatophyte 06/21/2013    1. OSA on CPAP Continue with nightly compliance, I will have office staff contact DME company about her eligibility for a new machine.  2. CPAP use counseling Discussed importance of adequate CPAP use as well as proper care and cleaning techniques of machine and all supplies.  3. Coronary artery disease involving native coronary artery of native heart without angina pectoris Followed by cardiology, stable at this time, no active chest pain  General Counseling: I have discussed the findings of the evaluation and examination with Tamalyn.  I have also discussed any further diagnostic evaluation  thatmay be needed or ordered today. Leianna verbalizes understanding of the findings of todays visit. We also reviewed her medications today and discussed drug interactions and side effects including but not limited excessive drowsiness and altered mental states. We also discussed that there is always a risk not just to her but also people around her. she has been encouraged to call the office with any questions or concerns that should arise related to todays visit.  Time spent: 25 Time spent includes review of chart, medications, test results and follow-up plan with the patient.  I have personally obtained a history, examined the patient, evaluated laboratory and imaging results, formulated the assessment and plan and placed orders. This patient was seen by Brent General AGNP-C in Collaboration with Dr. Freda Munro as a part of collaborative care agreement.    Yevonne Pax, MD [facility] Pulmonary and Critical Care Sleep medicine

## 2020-10-23 NOTE — Progress Notes (Signed)
PTNS   Session # 6  Health & Social Factors: no change Caffeine: 1 Alcohol: 0 Daytime voids #per day: 3-4 Night-time voids #per night: 3-4 Urgency: strong Incontinence Episodes #per day: 2 Ankle used: left Treatment Setting: 3 Feeling/ Response: both Comments: patient tolerated well  Performed By: Eligha Bridegroom, CMA   Follow Up: 1 week

## 2020-10-23 NOTE — Patient Instructions (Signed)
Tracking Your Bladder Symptoms    Patient Name:___________________________________________________   Sample: Day   Daytime Voids  Nighttime Voids Urgency for the Day(0-4) Number of Accidents Beverage Comments  Monday IIII II 2 I Water IIII Coffee  I      Week Starting:____________________________________   Day Daytime  Voids Nighttime  Voids Urgency for  The Day(0-4) Number of Accidents Beverages Comments                                                           This week my symptoms were:  O much better  O better O the same O worse   

## 2020-10-24 NOTE — Patient Instructions (Signed)

## 2020-10-25 ENCOUNTER — Telehealth: Payer: Self-pay

## 2020-10-25 NOTE — Telephone Encounter (Signed)
Gave [employer] orders for new cpap machine. Danielle [name]

## 2020-10-30 ENCOUNTER — Ambulatory Visit (INDEPENDENT_AMBULATORY_CARE_PROVIDER_SITE_OTHER): Payer: Medicare Other | Admitting: Family Medicine

## 2020-10-30 ENCOUNTER — Other Ambulatory Visit: Payer: Self-pay

## 2020-10-30 DIAGNOSIS — N3946 Mixed incontinence: Secondary | ICD-10-CM | POA: Diagnosis not present

## 2020-10-30 NOTE — Patient Instructions (Signed)
Tracking Your Bladder Symptoms    Patient Name:___________________________________________________   Sample: Day   Daytime Voids  Nighttime Voids Urgency for the Day(0-4) Number of Accidents Beverage Comments  Monday IIII II 2 I Water IIII Coffee  I      Week Starting:____________________________________   Day Daytime  Voids Nighttime  Voids Urgency for  The Day(0-4) Number of Accidents Beverages Comments                                                           This week my symptoms were:  O much better  O better O the same O worse   

## 2020-10-30 NOTE — Progress Notes (Signed)
PTNS  Session # 7  Health & Social Factors: no change Caffeine: 1 Alcohol: 0 Daytime voids #per day: 3-4  Night-time voids #per night: 3-4 Urgency: strong Incontinence Episodes #per day: 2 Ankle used: right Treatment Setting: 11 Feeling/ Response: sensory Comments: Patient tolerated well  Performed By: Teressa Lower, CMA  Follow Up: 1 week #8

## 2020-11-06 ENCOUNTER — Other Ambulatory Visit: Payer: Self-pay

## 2020-11-06 ENCOUNTER — Ambulatory Visit (INDEPENDENT_AMBULATORY_CARE_PROVIDER_SITE_OTHER): Payer: Medicare Other

## 2020-11-06 DIAGNOSIS — N3946 Mixed incontinence: Secondary | ICD-10-CM

## 2020-11-06 NOTE — Progress Notes (Signed)
PTNS  Session # 8  Health & Social Factors: no change Caffeine: 0-1 Alcohol: 0 Daytime voids #per day: 3-4 Night-time voids #per night: 3-4 Urgency: 4-5 Incontinence Episodes #per day: 0-3 Ankle used: Left Treatment Setting: 2 Feeling/ Response: sensation and toe flex Comments: pt tolerated well  Performed By: Gerarda Gunther, RMA  Follow Up: 1 week

## 2020-11-06 NOTE — Patient Instructions (Signed)
Tracking Your Bladder Symptoms    Patient Name:___________________________________________________   Sample: Day   Daytime Voids  Nighttime Voids Urgency for the Day(0-4) Number of Accidents Beverage Comments  Monday IIII II 2 I Water IIII Coffee  I      Week Starting:____________________________________   Day Daytime  Voids Nighttime  Voids Urgency for  The Day(0-4) Number of Accidents Beverages Comments                                                           This week my symptoms were:  O much better  O better O the same O worse   

## 2020-11-13 ENCOUNTER — Ambulatory Visit (INDEPENDENT_AMBULATORY_CARE_PROVIDER_SITE_OTHER): Payer: Medicare Other | Admitting: Family Medicine

## 2020-11-13 ENCOUNTER — Other Ambulatory Visit: Payer: Self-pay

## 2020-11-13 DIAGNOSIS — N3946 Mixed incontinence: Secondary | ICD-10-CM | POA: Diagnosis not present

## 2020-11-13 NOTE — Progress Notes (Signed)
PTNS  Session # 9  Health & Social Factors: no change Caffeine: 1 Alcohol: 0 Daytime voids #per day: 4 Night-time voids #per night: 3 Urgency: strong Incontinence Episodes #per day: 3-4 Ankle used: right Treatment Setting: 9 Feeling/ Response: sensory Comments: patient tolerated well  Performed By: Teressa Lower, CMA   Follow Up: 1 week #10

## 2020-11-13 NOTE — Patient Instructions (Signed)
Tracking Your Bladder Symptoms    Patient Name:___________________________________________________   Sample: Day   Daytime Voids  Nighttime Voids Urgency for the Day(0-4) Number of Accidents Beverage Comments  Monday IIII II 2 I Water IIII Coffee  I      Week Starting:____________________________________   Day Daytime  Voids Nighttime  Voids Urgency for  The Day(0-4) Number of Accidents Beverages Comments                                                           This week my symptoms were:  O much better  O better O the same O worse   

## 2020-11-20 ENCOUNTER — Other Ambulatory Visit: Payer: Self-pay

## 2020-11-20 ENCOUNTER — Ambulatory Visit: Payer: Medicare Other

## 2020-11-20 DIAGNOSIS — N3946 Mixed incontinence: Secondary | ICD-10-CM

## 2020-11-20 NOTE — Patient Instructions (Signed)
Tracking Your Bladder Symptoms    Patient Name:___________________________________________________   Sample: Day   Daytime Voids  Nighttime Voids Urgency for the Day(0-4) Number of Accidents Beverage Comments  Monday IIII II 2 I Water IIII Coffee  I      Week Starting:____________________________________   Day Daytime  Voids Nighttime  Voids Urgency for  The Day(0-4) Number of Accidents Beverages Comments                                                           This week my symptoms were:  O much better  O better O the same O worse   

## 2020-11-20 NOTE — Progress Notes (Signed)
PTNS  Session # 10  Health & Social Factors: No Change Caffeine: 1 Alcohol: 0 Daytime voids #per day: 4 Night-time voids #per night: 3 Urgency: Strong  Incontinence Episodes #per day: 4 Ankle used: Left  Treatment Setting: 4 Feeling/ Response: Sensory & Toe Flex  Comments: Pt did not bring voiding diary   Performed By: Debbe Bales, CMA   Follow Up: RTC in 1 week

## 2020-11-23 NOTE — Telephone Encounter (Signed)
This encounter was created in error - please disregard.

## 2020-11-27 ENCOUNTER — Other Ambulatory Visit: Payer: Self-pay

## 2020-11-27 ENCOUNTER — Ambulatory Visit (INDEPENDENT_AMBULATORY_CARE_PROVIDER_SITE_OTHER): Payer: Medicare Other

## 2020-11-27 DIAGNOSIS — R32 Unspecified urinary incontinence: Secondary | ICD-10-CM | POA: Diagnosis not present

## 2020-11-27 NOTE — Progress Notes (Signed)
PTNS  Session # 11  Health & Social Factors: No change Caffeine: 1 Alcohol: 0 Daytime voids #per day: 4-5  Night-time voids #per night: 3 Urgency: strong Incontinence Episodes #per day: 3 Ankle used: right Treatment Setting: 8 Feeling/ Response: toe flex and sensory Comments: patient tolerated well. Feels treatments are not helping  Performed By: Gerarda Gunther, RMA  Follow Up: 1 week.

## 2020-11-27 NOTE — Patient Instructions (Addendum)
First PTNS treatment was 09/11/2020 Last PTNS treatment is 12/04/2020

## 2020-12-04 ENCOUNTER — Ambulatory Visit: Payer: Medicare Other

## 2020-12-04 ENCOUNTER — Other Ambulatory Visit: Payer: Self-pay

## 2020-12-04 DIAGNOSIS — R32 Unspecified urinary incontinence: Secondary | ICD-10-CM

## 2020-12-04 NOTE — Progress Notes (Signed)
PTNS  Session # 12  Health & Social Factors: No Change Caffeine: 1 Alcohol: 0 Daytime voids #per day: 4-5 Night-time voids #per night: 3 Urgency: Strong Incontinence Episodes #per day:  4 Ankle used: Left Treatment Setting: 9 Feeling/ Response: Sensory & Toe Flex Comments: No voiding diary, pt scheduled for 1 month follow up w/ provider  Performed By: Debbe Bales, CMA  Follow Up: RTC in 1 month

## 2020-12-06 ENCOUNTER — Other Ambulatory Visit: Payer: Self-pay | Admitting: Hospice and Palliative Medicine

## 2020-12-13 DIAGNOSIS — E039 Hypothyroidism, unspecified: Secondary | ICD-10-CM | POA: Insufficient documentation

## 2021-01-08 ENCOUNTER — Other Ambulatory Visit: Payer: Self-pay

## 2021-01-08 ENCOUNTER — Encounter: Payer: Self-pay | Admitting: Urology

## 2021-01-08 ENCOUNTER — Ambulatory Visit (INDEPENDENT_AMBULATORY_CARE_PROVIDER_SITE_OTHER): Payer: Medicare Other | Admitting: Urology

## 2021-01-08 VITALS — BP 170/69 | HR 70 | Ht 63.0 in | Wt 246.0 lb

## 2021-01-08 DIAGNOSIS — N3946 Mixed incontinence: Secondary | ICD-10-CM

## 2021-01-08 MED ORDER — TOLTERODINE TARTRATE ER 4 MG PO CP24: 4.0000 mg | ORAL_CAPSULE | Freq: Every day | ORAL | 11 refills | Status: DC

## 2021-01-08 NOTE — Progress Notes (Signed)
01/08/2021 1:46 PM   Danielle [name] 12/14/1928 [id number]  Referring provider: Hillery Aldo, MD [address] [place],  [place] [address]  Chief Complaint  Patient presents with  . Follow-up    HPI: I was consulted to assess the patient urinary continence. Patient saw Dr. Kathie Rhodes for years ago and had failed Vesicare oxybutynin and Myrbetriq.  She has urge incontinence. No stress incontinence. Moderately severe bedwetting. Wears to the 3 pads a day sometimes moderately wet but it varies.  She voids every 2-3 hours and gets up 2 or 3 times at night.  Flow was good. Has had a hysterectomy. Can walk slowly with 2 canes  I would like to see the patient back on the new beta 3 agonist samples in 6 weeks for pelvic examination and cystoscopy. I gave her several weeks of samples. I think we will probably will need to have reasonable treatment goals and likely percutaneous tibial nerve stimulation will be the next option. Call if urine culture is positive.I will not order urodynamics at this stage  No benefit from new beta 3 agonists.  Clinically not infected Cystoscopy normal  Patient has exhausted medication of overactive bladder.  Percutaneous tibial nerve stimulation with usual template discussed.  Handout given.  Patient will call if she wishes to have them.  She is elderly with a functional component and we need to have reasonable goals.  Call if urine culture is positive otherwise see as needed  2-day Frequency stable.  Last culture negative Medically not infected.  Patient still continent and has failed percutaneous tibial nerve stimulation.     PMH: Past Medical History:  Diagnosis Date  . Anxiety   . Arthritis   . Depression   . Dyspnea    with exertion  . Headache    migraines  . History of myocarditis   . Hyperlipemia   . Hypertension   . Hypocholesterolemia   . Hypothyroidism   . Myocardial infarction (HCC) 2010  . Sleep apnea    uses a  cpap    Surgical History: Past Surgical History:  Procedure Laterality Date  . ABDOMINAL HYSTERECTOMY    . APPENDECTOMY    . CATARACT EXTRACTION W/PHACO Left 03/09/2020   Procedure: CATARACT EXTRACTION PHACO AND INTRAOCULAR LENS PLACEMENT (IOC) LEFT;  Surgeon: Lockie Mola, MD;  Location: [facility] ORS;  Service: Ophthalmology;  Laterality: Left;  cde 9.56 us1:32.8 ap10.3%  . EYE SURGERY Right    cataract extraction    Home Medications:  Allergies as of 01/08/2021      Reactions   Ace Inhibitors Other (See Comments)   Angiodema to Ace-inhibitor 2013   Penicillins Rash   SEVERE RASH. Has patient had a PCN reaction causing immediate rash, facial/tongue/throat swelling, SOB or lightheadedness with hypotension: Unknown Has patient had a PCN reaction causing severe rash involving mucus membranes or skin necrosis: Unknown Has patient had a PCN reaction that required hospitalization: Unknown Has patient had a PCN reaction occurring within the last 10 years: Unknown If all of the above answers are "NO", then may proceed with Cephalosporin use. Unknown   Penicillin V Potassium Nausea And Vomiting, Rash      Medication List       Accurate as of Jan 08, 2021  1:46 PM. If you have any questions, ask your nurse or doctor.        acetaminophen 500 MG tablet Commonly known as: TYLENOL 1,000 mg every 8 (eight) hours as needed for moderate pain.  alum & mag hydroxide-simeth 400-400-40 MG/5ML suspension Commonly known as: MAALOX PLUS Take 15 mLs by mouth as needed for indigestion.   aspirin EC 81 MG tablet Take 1 tablet by mouth daily.   donepezil 5 MG tablet Commonly known as: ARICEPT Take 5 mg by mouth at bedtime.   hydrochlorothiazide 25 MG tablet Commonly known as: HYDRODIURIL Take 25 mg by mouth daily.   isosorbide mononitrate 30 MG 24 hr tablet Commonly known as: IMDUR Take 30 mg by mouth daily.   levothyroxine 200 MCG tablet Commonly known as: SYNTHROID Take 200  mcg by mouth daily before breakfast.   metoprolol succinate 50 MG 24 hr tablet Commonly known as: TOPROL-XL Take 50 mg by mouth daily. Take with or immediately following a meal (after breakfast)   naproxen sodium 220 MG tablet Commonly known as: ALEVE Take 220 mg by mouth 2 (two) times daily as needed (pain).   nitroGLYCERIN 0.4 MG SL tablet Commonly known as: NITROSTAT Place 0.4 mg under the tongue every 5 (five) minutes as needed for chest pain.   polyethylene glycol powder 17 GM/SCOOP powder Commonly known as: GLYCOLAX/MIRALAX Take 17 g by mouth daily as needed for moderate constipation.   potassium chloride 10 MEQ tablet Commonly known as: KLOR-CON Take 10 mEq by mouth 2 (two) times daily.   ranolazine 500 MG 12 hr tablet Commonly known as: RANEXA Take by mouth.   sertraline 100 MG tablet Commonly known as: ZOLOFT Take 100 mg by mouth daily.   Vitamin D (Ergocalciferol) 1.25 MG (50000 UNIT) Caps capsule Commonly known as: DRISDOL Take 50,000 Units by mouth every Tuesday.       Allergies:  Allergies  Allergen Reactions  . Ace Inhibitors Other (See Comments)    Angiodema to Ace-inhibitor 2013  . Penicillins Rash    SEVERE RASH. Has patient had a PCN reaction causing immediate rash, facial/tongue/throat swelling, SOB or lightheadedness with hypotension: Unknown Has patient had a PCN reaction causing severe rash involving mucus membranes or skin necrosis: Unknown Has patient had a PCN reaction that required hospitalization: Unknown Has patient had a PCN reaction occurring within the last 10 years: Unknown If all of the above answers are "NO", then may proceed with Cephalosporin use. Unknown   . Penicillin V Potassium Nausea And Vomiting and Rash    Family History: Family History  Problem Relation Age of Onset  . Bladder Cancer Neg Hx   . Kidney cancer Neg Hx   . Breast cancer Neg Hx     Social History:  reports that she has never smoked. She has never  used smokeless tobacco. She reports that she does not drink alcohol and does not use drugs.  ROS:                                        Physical Exam: There were no vitals taken for this visit.  Constitutional:  Alert and oriented, No acute distress.   Laboratory Data: Lab Results  Component Value Date   WBC 5.0 08/29/2017   HGB 12.5 08/29/2017   HCT 37.6 08/29/2017   MCV 90.6 08/29/2017   PLT 216 08/29/2017    Lab Results  Component Value Date   CREATININE 0.85 08/29/2017    No results found for: PSA  No results found for: TESTOSTERONE  No results found for: HGBA1C  Urinalysis    Component Value Date/Time  COLORURINE YELLOW (A) 08/29/2017 1543   APPEARANCEUR Clear 07/31/2020 1517   LABSPEC 1.023 08/29/2017 1543   LABSPEC 1.023 08/09/2012 1758   PHURINE 7.0 08/29/2017 1543   GLUCOSEU Negative 07/31/2020 1517   GLUCOSEU Negative 08/09/2012 1758   HGBUR NEGATIVE 08/29/2017 1543   BILIRUBINUR Negative 07/31/2020 1517   BILIRUBINUR Negative 08/09/2012 1758   KETONESUR 20 (A) 08/29/2017 1543   PROTEINUR Negative 07/31/2020 1517   PROTEINUR NEGATIVE 08/29/2017 1543   NITRITE Negative 07/31/2020 1517   NITRITE NEGATIVE 08/29/2017 1543   LEUKOCYTESUR Negative 07/31/2020 1517   LEUKOCYTESUR Trace 08/09/2012 1758    Pertinent Imaging:   Assessment & Plan: Sent in Detrol LA 30 tablets 11 refills recognizing limitations.  I did not offer other third line therapies.  See as needed  There are no diagnoses linked to this encounter.  No follow-ups on file.  Martina Sinner, MD  [facility] [address] [place], [place] [address] [phone]

## 2021-01-11 ENCOUNTER — Ambulatory Visit: Payer: Medicare Other | Admitting: Podiatry

## 2021-01-25 ENCOUNTER — Ambulatory Visit: Payer: Medicare Other | Admitting: Podiatry

## 2021-02-04 ENCOUNTER — Emergency Department
Admission: EM | Admit: 2021-02-04 | Discharge: 2021-02-04 | Disposition: A | Discharge status: Home / Self Care | Payer: Medicare Other | Attending: Emergency Medicine | Admitting: Emergency Medicine

## 2021-02-04 ENCOUNTER — Emergency Department: Payer: Medicare Other

## 2021-02-04 ENCOUNTER — Other Ambulatory Visit: Payer: Self-pay

## 2021-02-04 DIAGNOSIS — R079 Chest pain, unspecified: Secondary | ICD-10-CM

## 2021-02-04 DIAGNOSIS — I2510 Atherosclerotic heart disease of native coronary artery without angina pectoris: Secondary | ICD-10-CM | POA: Diagnosis not present

## 2021-02-04 DIAGNOSIS — Z79899 Other long term (current) drug therapy: Secondary | ICD-10-CM | POA: Diagnosis not present

## 2021-02-04 DIAGNOSIS — Z7982 Long term (current) use of aspirin: Secondary | ICD-10-CM | POA: Diagnosis not present

## 2021-02-04 DIAGNOSIS — R0602 Shortness of breath: Secondary | ICD-10-CM | POA: Diagnosis not present

## 2021-02-04 DIAGNOSIS — E039 Hypothyroidism, unspecified: Secondary | ICD-10-CM | POA: Insufficient documentation

## 2021-02-04 DIAGNOSIS — I119 Hypertensive heart disease without heart failure: Secondary | ICD-10-CM | POA: Diagnosis not present

## 2021-02-04 LAB — CBC WITH DIFFERENTIAL/PLATELET
Abs Immature Granulocytes: 0.03 10*3/uL (ref 0.00–0.07)
Basophils Absolute: 0 10*3/uL (ref 0.0–0.1)
Basophils Relative: 0 %
Eosinophils Absolute: 0 10*3/uL (ref 0.0–0.5)
Eosinophils Relative: 0 %
HCT: 31.6 % — ABNORMAL LOW (ref 36.0–46.0)
Hemoglobin: 10.3 g/dL — ABNORMAL LOW (ref 12.0–15.0)
Immature Granulocytes: 1 %
Lymphocytes Relative: 8 %
Lymphs Abs: 0.5 10*3/uL — ABNORMAL LOW (ref 0.7–4.0)
MCH: 30.4 pg (ref 26.0–34.0)
MCHC: 32.6 g/dL (ref 30.0–36.0)
MCV: 93.2 fL (ref 80.0–100.0)
Monocytes Absolute: 0.6 10*3/uL (ref 0.1–1.0)
Monocytes Relative: 10 %
Neutro Abs: 5.3 10*3/uL (ref 1.7–7.7)
Neutrophils Relative %: 81 %
Platelets: 194 10*3/uL (ref 150–400)
RBC: 3.39 MIL/uL — ABNORMAL LOW (ref 3.87–5.11)
RDW: 17.2 % — ABNORMAL HIGH (ref 11.5–15.5)
WBC: 6.5 10*3/uL (ref 4.0–10.5)
nRBC: 0 % (ref 0.0–0.2)

## 2021-02-04 LAB — TROPONIN I (HIGH SENSITIVITY)
Troponin I (High Sensitivity): 3 ng/L (ref <18)
Troponin I (High Sensitivity): 2 ng/L (ref <18)

## 2021-02-04 LAB — COMPREHENSIVE METABOLIC PANEL
ALT: 11 U/L (ref 0–44)
AST: 20 U/L (ref 15–41)
Albumin: 3.8 g/dL (ref 3.5–5.0)
Alkaline Phosphatase: 75 U/L (ref 38–126)
Anion gap: 10 (ref 5–15)
BUN: 39 mg/dL — ABNORMAL HIGH (ref 8–23)
CO2: 25 mmol/L (ref 22–32)
Calcium: 9.8 mg/dL (ref 8.9–10.3)
Chloride: 100 mmol/L (ref 98–111)
Creatinine, Ser: 1.01 mg/dL — ABNORMAL HIGH (ref 0.44–1.00)
GFR, Estimated: 53 mL/min — ABNORMAL LOW (ref >60)
Glucose, Bld: 134 mg/dL — ABNORMAL HIGH (ref 70–99)
Potassium: 4 mmol/L (ref 3.5–5.1)
Sodium: 135 mmol/L (ref 135–145)
Total Bilirubin: 0.9 mg/dL (ref 0.3–1.2)
Total Protein: 8.1 g/dL (ref 6.5–8.1)

## 2021-02-04 LAB — BRAIN NATRIURETIC PEPTIDE: B Natriuretic Peptide: 190.8 pg/mL — ABNORMAL HIGH (ref 0.0–100.0)

## 2021-02-04 LAB — LIPASE, BLOOD: Lipase: 25 U/L (ref 11–51)

## 2021-02-04 MED ORDER — FAMOTIDINE IN NACL 20-0.9 MG/50ML-% IV SOLN
20.0000 mg | Freq: Once | INTRAVENOUS | Status: AC
  Administered 2021-02-04: 20 mg via INTRAVENOUS
  Filled 2021-02-04: qty 50

## 2021-02-04 MED ORDER — MORPHINE SULFATE (PF) 2 MG/ML IV SOLN
2.0000 mg | Freq: Once | INTRAVENOUS | Status: AC
  Administered 2021-02-04: 2 mg via INTRAVENOUS
  Filled 2021-02-04: qty 1

## 2021-02-04 NOTE — Progress Notes (Signed)
   02/04/21 0855  Clinical Encounter Type  Visited With Patient  Visit Type Initial;Spiritual support;ED  Referral From Other (Comment)  Consult/Referral To Chaplain  Spiritual Encounters  Spiritual Needs Prayer  Chaplain Burris checked in with Ms. [place]. She seemed restful and calm in spite of pain but welcomed the visit and requested prayer.

## 2021-02-04 NOTE — Progress Notes (Signed)
   02/04/21 1420  Clinical Encounter Type  Visited With Patient  Visit Type Follow-up;Spiritual support;Social support;ED  Referral From Chaplain  Consult/Referral To Chaplain  Luna Fuse saw Ms. [place] in the hallway awaiting discharge. Chaplain Burris checked on patient's well-being since the events that brought her to the ED earlier. Luna Fuse offered compassionate presence and support while patient waited for her daughter.

## 2021-02-04 NOTE — ED Notes (Signed)
Patient transported to X-ray 

## 2021-02-04 NOTE — Discharge Instructions (Addendum)
Call Dr. America Brown office tomorrow at 8:30 AM.  Bonita Quin should arrange for a follow-up appointment within the next 1 to 2 days.  In the meantime, continue all of your normal medications.  Return to the ER for new, worsening, or persistent severe chest pain, abdominal pain, difficulty breathing, weakness or lightheadedness, nausea or vomiting, or any other new or worsening symptoms that concern you.

## 2021-02-04 NOTE — Progress Notes (Signed)
   02/04/21 0845  Clinical Encounter Type  Visited With Patient not available  Visit Type Initial;Code (STEMI)  Referral From Other (Comment)  Consult/Referral To Chaplain  Chaplain Burris responded to a code STEMI. Patient not available while receiving care interventions. Chaplain burris provided compassionate presence and bore witness to care. Will continue to follow and to check for family presence.

## 2021-02-04 NOTE — ED Triage Notes (Signed)
Pt via EMS from home. Pt c/o centralized CP since last night around 11:00pm. Pt states that pain has been constant. Pt also c/o SOB and nausea. Pt is A&Ox4 and NAD. EDP Siadecki at bedside

## 2021-02-04 NOTE — ED Provider Notes (Signed)
[facility] Emergency Department Provider Note ____________________________________________   Event Date/Time   First MD Initiated Contact with Patient 02/04/21 0900     (approximate)  I have reviewed the triage vital signs and the nursing notes.   HISTORY  Chief Complaint Chest Pain    HPI Yuleimy Kretz [place] is a 87 y.o. female with PMH as noted below including hypertension, hyperlipidemia and CAD who presents with chest pain, acute onset yesterday around 17 PM, persistent course since then, substernal in location, nonradiating.  It is associated with some shortness of breath but no nausea or vomiting, lightheadedness, or weakness.  She denies any cough or fever.  She states she has had similar pain in the past.  The pain has improved after she was given aspirin by EMS.  Past Medical History:  Diagnosis Date  . Anxiety   . Arthritis   . Depression   . Dyspnea    with exertion  . Headache    migraines  . History of myocarditis   . Hyperlipemia   . Hypertension   . Hypocholesterolemia   . Hypothyroidism   . Myocardial infarction (HCC) 2010  . Sleep apnea    uses a cpap    Patient Active Problem List   Diagnosis Date Noted  . Pain due to onychomycosis of toenails of both feet 02/15/2019  . Urge incontinence of urine 07/03/2017  . Coronary arteriosclerosis in native artery 05/29/2014  . CAD (coronary artery disease), native coronary artery 05/29/2014  . Hyperlipidemia 05/29/2014  . Hypertension 05/29/2014  . Hyperlipidemia, unspecified 05/29/2014  . Pain in extremity 06/21/2013  . Onychomycosis due to dermatophyte 06/21/2013    Past Surgical History:  Procedure Laterality Date  . ABDOMINAL HYSTERECTOMY    . APPENDECTOMY    . CATARACT EXTRACTION W/PHACO Left 03/09/2020   Procedure: CATARACT EXTRACTION PHACO AND INTRAOCULAR LENS PLACEMENT (IOC) LEFT;  Surgeon: Lockie Mola, MD;  Location: [facility] ORS;  Service: Ophthalmology;  Laterality:  Left;  cde 9.56 us1:32.8 ap10.3%  . EYE SURGERY Right    cataract extraction    Prior to Admission medications   Medication Sig Start Date End Date Taking? Authorizing Provider  acetaminophen (TYLENOL) 500 MG tablet 1,000 mg every 8 (eight) hours as needed for moderate pain.     [provider]  alum & mag hydroxide-simeth (MAALOX PLUS) 400-400-40 MG/5ML suspension Take 15 mLs by mouth as needed for indigestion.     [provider]  aspirin EC 81 MG tablet Take 1 tablet by mouth daily. 11/28/14   [provider]  donepezil (ARICEPT) 5 MG tablet Take 5 mg by mouth at bedtime.    [provider]  hydrochlorothiazide (HYDRODIURIL) 25 MG tablet Take 25 mg by mouth daily.    [provider]  isosorbide mononitrate (IMDUR) 30 MG 24 hr tablet Take 30 mg by mouth daily.  07/01/19   [provider]  levothyroxine (SYNTHROID) 200 MCG tablet Take 200 mcg by mouth daily before breakfast.  03/29/19   [provider]  metoprolol succinate (TOPROL-XL) 50 MG 24 hr tablet Take 50 mg by mouth daily. Take with or immediately following a meal (after breakfast)    [provider]  naproxen sodium (ALEVE) 220 MG tablet Take 220 mg by mouth 2 (two) times daily as needed (pain).    [provider]  nitroGLYCERIN (NITROSTAT) 0.4 MG SL tablet Place 0.4 mg under the tongue every 5 (five) minutes as needed for chest pain.  01/22/17  [provider]  polyethylene glycol powder (GLYCOLAX/MIRALAX) powder Take 17 g by mouth daily as needed for moderate constipation.  02/21/17   [provider]  potassium chloride (K-DUR,KLOR-CON) 10 MEQ tablet Take 10 mEq by mouth 2 (two) times daily.  03/02/18   [provider]  ranolazine (RANEXA) 500 MG 12 hr tablet Take by mouth. 06/29/20 06/29/21  [provider]  sertraline (ZOLOFT) 100 MG tablet Take 100 mg by mouth daily.  08/09/17   [provider]  tolterodine  (DETROL LA) 4 MG 24 hr capsule Take 1 capsule (4 mg total) by mouth daily. 01/08/21   Alfredo Martinez, MD  triamcinolone cream (KENALOG) 0.1 % SMARTSIG:1 Application Topical 2-3 Times Daily 11/28/20   [provider]  Vitamin D, Ergocalciferol, (DRISDOL) 1.25 MG (50000 UNIT) CAPS capsule Take 50,000 Units by mouth every Tuesday.    [provider]    Allergies Ace inhibitors, Penicillins, and Penicillin v potassium  Family History  Problem Relation Age of Onset  . Bladder Cancer Neg Hx   . Kidney cancer Neg Hx   . Breast cancer Neg Hx     Social History Social History   Tobacco Use  . Smoking status: Never Smoker  . Smokeless tobacco: Never Used  Vaping Use  . Vaping Use: Never used  Substance Use Topics  . Alcohol use: No  . Drug use: No    Review of Systems  Constitutional: No fever/chills Eyes: No visual changes. ENT: No sore throat. Cardiovascular: Positive for chest pain. Respiratory: Positive for shortness of breath. Gastrointestinal: No vomiting or diarrhea.  Genitourinary: Negative for dysuria.  Musculoskeletal: Negative for back pain. Skin: Negative for rash. Neurological: Negative for headache.   ____________________________________________   PHYSICAL EXAM:  VITAL SIGNS: ED Triage Vitals  Enc Vitals Group     BP 02/04/21 0851 100/79     Pulse Rate 02/04/21 0851 64     Resp 02/04/21 0851 15     Temp 02/04/21 0851 98 F (36.7 C)     Temp Source 02/04/21 0851 Oral     SpO2 02/04/21 0851 98 %     Weight 02/04/21 0849 240 lb (108.9 kg)     Height 02/04/21 0849 5\' 3"  (1.6 m)     Head Circumference --      Peak Flow --      Pain Score 02/04/21 0848 9     Pain Loc --      Pain Edu? --      Excl. in GC? --     Constitutional: Alert and oriented. Well appearing for age and in no acute distress. Eyes: Conjunctivae are normal.  Head: Atraumatic. Nose: No congestion/rhinnorhea. Mouth/Throat: Mucous membranes are moist.   Neck:  Normal range of motion.  Cardiovascular: Normal rate, regular rhythm. Grossly normal heart sounds.  Good peripheral circulation. Respiratory: Normal respiratory effort.  No retractions. Lungs CTAB. Gastrointestinal: No distention.  Musculoskeletal: No lower extremity edema.  Extremities warm and well perfused.  Neurologic:  Normal speech and language. No gross focal neurologic deficits are appreciated.  Skin:  Skin is warm and dry. No rash noted. Psychiatric: Mood and affect are normal. Speech and behavior are normal.  ____________________________________________   LABS (all labs ordered are listed, but only abnormal results are displayed)  Labs Reviewed  COMPREHENSIVE METABOLIC PANEL - Abnormal; Notable for the following components:      Result Value   Glucose, Bld 134 (*)    BUN 39 (*)  Creatinine, Ser 1.01 (*)    GFR, Estimated 53 (*)    All other components within normal limits  CBC WITH DIFFERENTIAL/PLATELET - Abnormal; Notable for the following components:   RBC 3.39 (*)    Hemoglobin 10.3 (*)    HCT 31.6 (*)    RDW 17.2 (*)    Lymphs Abs 0.5 (*)    All other components within normal limits  BRAIN NATRIURETIC PEPTIDE - Abnormal; Notable for the following components:   B Natriuretic Peptide 190.8 (*)    All other components within normal limits  LIPASE, BLOOD  TROPONIN I (HIGH SENSITIVITY)  TROPONIN I (HIGH SENSITIVITY)   ____________________________________________  EKG  ED ECG REPORT I, Dionne Bucy, the attending physician, personally viewed and interpreted this ECG.  Date: 02/04/2021 EKG Time: 0848 Rate: 59 Rhythm: normal sinus rhythm QRS Axis: normal Intervals: normal ST/T Wave abnormalities: LVH with repolarization abnormality Narrative Interpretation: Nonspecific ST abnormalities with no evidence of acute ischemia; no recent EKG available for comparison  ____________________________________________  RADIOLOGY  Chest x-ray interpreted by me  shows no focal consolidation or edema  ____________________________________________   PROCEDURES  Procedure(s) performed: No  Procedures  Critical Care performed: Yes  CRITICAL CARE Performed by: Dionne Bucy   Total critical care time: 15 minutes  Critical care time was exclusive of separately billable procedures and treating other patients.  Critical care was necessary to treat or prevent imminent or life-threatening deterioration.  Critical care was time spent personally by me on the following activities: development of treatment plan with patient and/or surrogate as well as nursing, discussions with consultants, evaluation of patient's response to treatment, examination of patient, obtaining history from patient or surrogate, ordering and performing treatments and interventions, ordering and review of laboratory studies, ordering and review of radiographic studies, pulse oximetry and re-evaluation of patient's condition. ____________________________________________   INITIAL IMPRESSION / ASSESSMENT AND PLAN / ED COURSE  Pertinent labs & imaging results that were available during my care of the patient were reviewed by me and considered in my medical decision making (see chart for details).  87 year old female with PMH as noted above including hypertension, hyperlipidemia, and CAD presents with somewhat atypical, nonexertional chest pain since last night.  EMS activated code STEMI due to borderline ST elevations in the inferior leads, however the EMS EKG does not show any reciprocal changes.  Upon arrival to the ED, the patient is overall well-appearing.  Her vital signs are normal.  The physical exam is unremarkable.  A repeat EKG was immediately obtained and shows repolarization abnormality with some nonspecific ST abnormalities but no clear ischemic changes.  It does not meet STEMI criteria.  I discussed the case over the phone with Dr. Eldridge Dace from cardiology who is  on call for STEMI today.  He reviewed the EKG and agreed with canceling code STEMI.  I reviewed the past medical records in Care Everywhere.  The patient follows with Dr. Lady Gary from cardiology and was most recently seen there in January.  Per the most recent A/P:  ...has been complaining of exertional chest pain and exertional shortness of breath. Functional study previously carried out showed no ischemia. Echocardiogram done in the past revealed preserved LV function with no significant valvular abnormalities however she has developed worsening shortness of breath and fatigue. Echo showed no appreciable change. We will continue with current regimen and follow-up for further symptoms  Differential includes ACS, angina, musculoskeletal pain, GERD, or other benign etiology.  I do not suspect PE given  the lack of leg pain or swelling, tachycardia, hypoxia, or specific VTE risk factors.  There is no evidence of aortic dissection or other vascular cause.  We will obtain basic labs, troponins x2, chest x-ray, discussed with cardiology, and reassess.  ----------------------------------------- 1:38 PM on 02/04/2021 -----------------------------------------  Troponins are negative x2 and the patient is feeling much better.  Her pain is almost completely resolved  The patient's pain improved but she still had some lingering pain more in the epigastric region.  I added on a lipase but this is negative.  There is no evidence of hepatobiliary etiology.  At this time, there is no evidence of ACS or other emergent etiology of the patient's chest pain.  I consulted Dr. Lady GaryFath, the patient's cardiologist.  He recommended that given the resolved symptoms and negative troponins, the patient would be appropriate for discharge home.  The patient feels comfortable and wants to go home.  Dr. Lady GaryFath recommended that she contact his office tomorrow morning for follow-up within the next 1 to 2 days, and she agrees.  Return  precautions given, and she expresses understanding.  ____________________________________________   FINAL CLINICAL IMPRESSION(S) / ED DIAGNOSES  Final diagnoses:  Nonspecific chest pain      NEW MEDICATIONS STARTED DURING THIS VISIT:  Discharge Medication List as of 02/04/2021  2:49 PM       Note:  This document was prepared using Dragon voice recognition software and may include unintentional dictation errors.    Dionne BucySiadecki, Yvanna Vidas, MD 02/04/21 1520

## 2021-02-10 ENCOUNTER — Emergency Department
Admission: EM | Admit: 2021-02-10 | Discharge: 2021-02-10 | Disposition: A | Discharge status: Home / Self Care | Payer: Medicare Other | Attending: Emergency Medicine | Admitting: Emergency Medicine

## 2021-02-10 ENCOUNTER — Emergency Department: Payer: Medicare Other

## 2021-02-10 ENCOUNTER — Other Ambulatory Visit: Payer: Self-pay

## 2021-02-10 DIAGNOSIS — I10 Essential (primary) hypertension: Secondary | ICD-10-CM | POA: Insufficient documentation

## 2021-02-10 DIAGNOSIS — F0390 Unspecified dementia without behavioral disturbance: Secondary | ICD-10-CM | POA: Diagnosis not present

## 2021-02-10 DIAGNOSIS — R4182 Altered mental status, unspecified: Secondary | ICD-10-CM | POA: Diagnosis not present

## 2021-02-10 DIAGNOSIS — I2510 Atherosclerotic heart disease of native coronary artery without angina pectoris: Secondary | ICD-10-CM | POA: Insufficient documentation

## 2021-02-10 DIAGNOSIS — E039 Hypothyroidism, unspecified: Secondary | ICD-10-CM | POA: Diagnosis not present

## 2021-02-10 DIAGNOSIS — Z79899 Other long term (current) drug therapy: Secondary | ICD-10-CM | POA: Insufficient documentation

## 2021-02-10 DIAGNOSIS — N3090 Cystitis, unspecified without hematuria: Secondary | ICD-10-CM | POA: Diagnosis not present

## 2021-02-10 DIAGNOSIS — E860 Dehydration: Secondary | ICD-10-CM | POA: Diagnosis not present

## 2021-02-10 DIAGNOSIS — Z7982 Long term (current) use of aspirin: Secondary | ICD-10-CM | POA: Diagnosis not present

## 2021-02-10 DIAGNOSIS — Z20822 Contact with and (suspected) exposure to covid-19: Secondary | ICD-10-CM | POA: Diagnosis not present

## 2021-02-10 DIAGNOSIS — R4781 Slurred speech: Secondary | ICD-10-CM | POA: Diagnosis present

## 2021-02-10 LAB — CBC
HCT: 29.5 % — ABNORMAL LOW (ref 36.0–46.0)
Hemoglobin: 9.6 g/dL — ABNORMAL LOW (ref 12.0–15.0)
MCH: 30.4 pg (ref 26.0–34.0)
MCHC: 32.5 g/dL (ref 30.0–36.0)
MCV: 93.4 fL (ref 80.0–100.0)
Platelets: 207 10*3/uL (ref 150–400)
RBC: 3.16 MIL/uL — ABNORMAL LOW (ref 3.87–5.11)
RDW: 17.3 % — ABNORMAL HIGH (ref 11.5–15.5)
WBC: 4.7 10*3/uL (ref 4.0–10.5)
nRBC: 0 % (ref 0.0–0.2)

## 2021-02-10 LAB — URINALYSIS, COMPLETE (UACMP) WITH MICROSCOPIC
Bacteria, UA: NONE SEEN
Bilirubin Urine: NEGATIVE
Glucose, UA: NEGATIVE mg/dL
Hgb urine dipstick: NEGATIVE
Ketones, ur: 5 mg/dL — AB
Leukocytes,Ua: NEGATIVE
Nitrite: POSITIVE — AB
Protein, ur: NEGATIVE mg/dL
Specific Gravity, Urine: 1.014 (ref 1.005–1.030)
pH: 7 (ref 5.0–8.0)

## 2021-02-10 LAB — COMPREHENSIVE METABOLIC PANEL
ALT: 15 U/L (ref 0–44)
AST: 19 U/L (ref 15–41)
Albumin: 3.3 g/dL — ABNORMAL LOW (ref 3.5–5.0)
Alkaline Phosphatase: 66 U/L (ref 38–126)
Anion gap: 8 (ref 5–15)
BUN: 31 mg/dL — ABNORMAL HIGH (ref 8–23)
CO2: 27 mmol/L (ref 22–32)
Calcium: 9.7 mg/dL (ref 8.9–10.3)
Chloride: 102 mmol/L (ref 98–111)
Creatinine, Ser: 1.04 mg/dL — ABNORMAL HIGH (ref 0.44–1.00)
GFR, Estimated: 51 mL/min — ABNORMAL LOW (ref >60)
Glucose, Bld: 99 mg/dL (ref 70–99)
Potassium: 3.9 mmol/L (ref 3.5–5.1)
Sodium: 137 mmol/L (ref 135–145)
Total Bilirubin: 0.8 mg/dL (ref 0.3–1.2)
Total Protein: 7.8 g/dL (ref 6.5–8.1)

## 2021-02-10 LAB — BLOOD GAS, VENOUS
Acid-Base Excess: 2.3 mmol/L — ABNORMAL HIGH (ref 0.0–2.0)
Bicarbonate: 27.3 mmol/L (ref 20.0–28.0)
O2 Saturation: 62.4 %
Patient temperature: 37
pCO2, Ven: 43 mmHg — ABNORMAL LOW (ref 44.0–60.0)
pH, Ven: 7.41 (ref 7.250–7.430)
pO2, Ven: 32 mmHg (ref 32.0–45.0)

## 2021-02-10 LAB — RESP PANEL BY RT-PCR (FLU A&B, COVID) ARPGX2
Influenza A by PCR: NEGATIVE
Influenza B by PCR: NEGATIVE
SARS Coronavirus 2 by RT PCR: NEGATIVE

## 2021-02-10 MED ORDER — ONDANSETRON 4 MG PO TBDP: 4.0000 mg | ORAL_TABLET | Freq: Three times a day (TID) | ORAL | 0 refills | Status: DC | PRN

## 2021-02-10 MED ORDER — SODIUM CHLORIDE 0.9 % IV BOLUS
500.0000 mL | Freq: Once | INTRAVENOUS | Status: AC
Start: 2021-02-10 — End: 2021-02-10
  Administered 2021-02-10: 500 mL via INTRAVENOUS

## 2021-02-10 MED ORDER — CEPHALEXIN 500 MG PO CAPS: 500.0000 mg | ORAL_CAPSULE | Freq: Three times a day (TID) | ORAL | 0 refills | Status: DC

## 2021-02-10 NOTE — ED Notes (Signed)
Pt tolerated ice water with no difficulty swallowing.

## 2021-02-10 NOTE — ED Triage Notes (Signed)
Pt arrives via EMS from home for "slowed speech" since noon- pt was seen here Sunday for CP and was diagnosed with afib- pt denies any other complaints other than her speech however when she was asked to sign for EMS, she was reaching in the wrong direction for the pen- pt cbg was 119 per EMS- pt is A&O x4

## 2021-02-10 NOTE — ED Provider Notes (Signed)
[facility] Emergency Department Provider Note  ____________________________________________  Time seen: Approximately 3:58 PM  I have reviewed the triage vital signs and the nursing notes.   HISTORY  Chief Complaint Altered Mental Status    Level 5 Caveat: Portions of the History and Physical including HPI and review of systems are unable to be completely obtained due to patient altered mental status and chronic dementia HPI Danielle [name] is a 87 y.o. female with a history of anxiety depression dementia hypertension CAD who was brought to the ED due to slurred speech that started around noon today.  Gradual onset.  She reports she has not been feeling well the last few days with fatigue and decreased appetite and decreased oral intake.  Denies pain vomiting diarrhea or constipation.  Denies any other acute complaints.  She does report chronic sciatica pain.  Denies vision changes, headache, motor weakness, paresthesia.  No falls or trauma.  No change in balance or coordination.    Past Medical History:  Diagnosis Date   Anxiety    Arthritis    Depression    Dyspnea    with exertion   Headache    migraines   History of myocarditis    Hyperlipemia    Hypertension    Hypocholesterolemia    Hypothyroidism    Myocardial infarction (HCC) 2010   Sleep apnea    uses a cpap     Patient Active Problem List   Diagnosis Date Noted   Pain due to onychomycosis of toenails of both feet 02/15/2019   Urge incontinence of urine 07/03/2017   Coronary arteriosclerosis in native artery 05/29/2014   CAD (coronary artery disease), native coronary artery 05/29/2014   Hyperlipidemia 05/29/2014   Hypertension 05/29/2014   Hyperlipidemia, unspecified 05/29/2014   Pain in extremity 06/21/2013   Onychomycosis due to dermatophyte 06/21/2013     Past Surgical History:  Procedure Laterality Date   ABDOMINAL HYSTERECTOMY     APPENDECTOMY     CATARACT EXTRACTION  W/PHACO Left 03/09/2020   Procedure: CATARACT EXTRACTION PHACO AND INTRAOCULAR LENS PLACEMENT (IOC) LEFT;  Surgeon: Lockie MolaBrasington, Chadwick, MD;  Location: [facility] ORS;  Service: Ophthalmology;  Laterality: Left;  cde 9.56 us1:32.8 ap10.3%   EYE SURGERY Right    cataract extraction     Prior to Admission medications   Medication Sig Start Date End Date Taking? Authorizing Provider  cephALEXin (KEFLEX) 500 MG capsule Take 1 capsule (500 mg total) by mouth 3 (three) times daily. 02/10/21  Yes Sharman CheekStafford, Analycia Khokhar, MD  ondansetron (ZOFRAN ODT) 4 MG disintegrating tablet Take 1 tablet (4 mg total) by mouth every 8 (eight) hours as needed for nausea or vomiting. 02/10/21  Yes Sharman CheekStafford, Ruari Mudgett, MD  acetaminophen (TYLENOL) 500 MG tablet 1,000 mg every 8 (eight) hours as needed for moderate pain.     [provider]  alum & mag hydroxide-simeth (MAALOX PLUS) 400-400-40 MG/5ML suspension Take 15 mLs by mouth as needed for indigestion.     [provider]  aspirin EC 81 MG tablet Take 1 tablet by mouth daily. 11/28/14   [provider]  donepezil (ARICEPT) 5 MG tablet Take 5 mg by mouth at bedtime.    [provider]  hydrochlorothiazide (HYDRODIURIL) 25 MG tablet Take 25 mg by mouth daily.    [provider]  isosorbide mononitrate (IMDUR) 30 MG 24 hr tablet Take 30 mg by mouth daily.  07/01/19   [provider]  levothyroxine (SYNTHROID) 200 MCG tablet Take 200  mcg by mouth daily before breakfast.  03/29/19   [provider]  metoprolol succinate (TOPROL-XL) 50 MG 24 hr tablet Take 50 mg by mouth daily. Take with or immediately following a meal (after breakfast)    [provider]  naproxen sodium (ALEVE) 220 MG tablet Take 220 mg by mouth 2 (two) times daily as needed (pain).    [provider]  nitroGLYCERIN (NITROSTAT) 0.4 MG SL tablet Place 0.4 mg under the tongue every 5 (five) minutes as needed for chest pain.  01/22/17   [provider]  polyethylene glycol powder (GLYCOLAX/MIRALAX) powder Take 17 g by mouth daily as needed for moderate constipation.  02/21/17   [provider]  potassium chloride (K-DUR,KLOR-CON) 10 MEQ tablet Take 10 mEq by mouth 2 (two) times daily.  03/02/18   [provider]  ranolazine (RANEXA) 500 MG 12 hr tablet Take by mouth. 06/29/20 06/29/21  [provider]  sertraline (ZOLOFT) 100 MG tablet Take 100 mg by mouth daily.  08/09/17   [provider]  tolterodine (DETROL LA) 4 MG 24 hr capsule Take 1 capsule (4 mg total) by mouth daily. 01/08/21   Alfredo Martinez, MD  triamcinolone cream (KENALOG) 0.1 % SMARTSIG:1 Application Topical 2-3 Times Daily 11/28/20   [provider]  Vitamin D, Ergocalciferol, (DRISDOL) 1.25 MG (50000 UNIT) CAPS capsule Take 50,000 Units by mouth every Tuesday.    [provider]     Allergies Ace inhibitors, Penicillins, and Penicillin v potassium   Family History  Problem Relation Age of Onset   Bladder Cancer Neg Hx    Kidney cancer Neg Hx    Breast cancer Neg Hx     Social History Social History   Tobacco Use   Smoking status: Never   Smokeless tobacco: Never  Vaping Use   Vaping Use: Never used  Substance Use Topics   Alcohol use: No   Drug use: No    Review of Systems Level 5 Caveat: Portions of the History and Physical including HPI and review of systems are unable to be completely obtained due to patient being a poor historian   Constitutional:   No known fever.  ENT:   No rhinorrhea. Cardiovascular:   No chest pain or syncope. Respiratory:   No dyspnea or cough. Gastrointestinal:   Negative for abdominal pain, vomiting and diarrhea.  Musculoskeletal:   Negative for focal pain or swelling ____________________________________________   PHYSICAL EXAM:  VITAL SIGNS: ED Triage Vitals  Enc Vitals Group     BP 02/10/21 1500 (!) 112/46     Pulse Rate 02/10/21 1500 88     Resp  02/10/21 1500 16     Temp 02/10/21 1500 97.7 F (36.5 C)     Temp Source 02/10/21 1500 Oral     SpO2 02/10/21 1500 96 %     Weight 02/10/21 1502 136 lb (61.7 kg)     Height 02/10/21 1502 5\' 3"  (1.6 m)     Head Circumference --      Peak Flow --      Pain Score 02/10/21 1502 0     Pain Loc --      Pain Edu? --      Excl. in GC? --     Vital signs reviewed, nursing assessments reviewed.   Constitutional:   Alert and oriented. Non-toxic appearance. Eyes:   Conjunctivae are normal. EOMI. PERRL.  No nystagmus.  Conjugate gaze. ENT      Head:  Normocephalic and atraumatic.      Nose:   No congestion/rhinnorhea.       Mouth/Throat:   MMM, no pharyngeal erythema. No peritonsillar mass.       Neck:   No meningismus. Full ROM. Hematological/Lymphatic/Immunilogical:   No cervical lymphadenopathy. Cardiovascular:   RRR. Symmetric bilateral radial and DP pulses.  No murmurs. Cap refill less than 2 seconds. Respiratory:   Normal respiratory effort without tachypnea/retractions. Breath sounds are clear and equal bilaterally. No wheezes/rales/rhonchi. Gastrointestinal:   Soft and nontender. Non distended. There is no CVA tenderness.  No rebound, rigidity, or guarding. Genitourinary:   deferred Musculoskeletal:   Normal range of motion in all extremities. No joint effusions.  No lower extremity tenderness.  No edema. Neurologic:   Slightly slurred speech, normal language.  Motor grossly intact. Cerebellar function intact  Skin:    Skin is warm, dry and intact. No rash noted.  No petechiae, purpura, or bullae.  ____________________________________________    LABS (pertinent positives/negatives) (all labs ordered are listed, but only abnormal results are displayed) Labs Reviewed  COMPREHENSIVE METABOLIC PANEL - Abnormal; Notable for the following components:      Result Value   BUN 31 (*)    Creatinine, Ser 1.04 (*)    Albumin 3.3 (*)    GFR, Estimated 51 (*)    All other components  within normal limits  CBC - Abnormal; Notable for the following components:   RBC 3.16 (*)    Hemoglobin 9.6 (*)    HCT 29.5 (*)    RDW 17.3 (*)    All other components within normal limits  BLOOD GAS, VENOUS - Abnormal; Notable for the following components:   pCO2, Ven 43 (*)    Acid-Base Excess 2.3 (*)    All other components within normal limits  URINALYSIS, COMPLETE (UACMP) WITH MICROSCOPIC - Abnormal; Notable for the following components:   Color, Urine YELLOW (*)    APPearance HAZY (*)    Ketones, ur 5 (*)    Nitrite POSITIVE (*)    All other components within normal limits  RESP PANEL BY RT-PCR (FLU A&B, COVID) ARPGX2  URINE CULTURE   ____________________________________________   EKG  Interpreted by me Atrial flutter with variable conduction, ventricular rate of about 100.  Normal axis, normal intervals.  LVH demonstrated in the high lateral leads with repolarization abnormality.  Normal ST segments and T waves.  ____________________________________________    RADIOLOGY  CT Head Wo Contrast  Result Date: 02/10/2021 CLINICAL DATA:  Altered mental status.  Slowed speech. EXAM: CT HEAD WITHOUT CONTRAST TECHNIQUE: Contiguous axial images were obtained from the base of the skull through the vertex without intravenous contrast. COMPARISON:  Report dated 08/09/2012 FINDINGS: Brain: Mildly to moderately enlarged ventricles and subarachnoid spaces. 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 Small bilateral subdural hygromas. No intracranial hemorrhage, mass lesion or CT evidence of acute infarction. Vascular: No hyperdense vessel or unexpected calcification. Skull: Normal. Negative for fracture or focal lesion. Sinuses/Orbits: Status post bilateral cataract extraction. Unremarkable bones and included paranasal sinuses. Other: Left concha bullosa. Deviation of the midportion of the nasal septum to the right. IMPRESSION: 1. No acute abnormality. 2. Mild-to-moderate diffuse cerebral  atrophy. 3. 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 4. Small bilateral chronic subdural hygromas. Electronically Signed   By: Beckie Salts M.D.   On: 02/10/2021 17:14    ____________________________________________   PROCEDURES Procedures  ____________________________________________  DIFFERENTIAL DIAGNOSIS   Dehydration, viral illness, electrolyte abnormality, AKI, UTI, intracranial  hemorrhage, hypercapnia, stroke  CLINICAL IMPRESSION / ASSESSMENT AND PLAN / ED COURSE  Medications ordered in the ED: Medications  sodium chloride 0.9 % bolus 500 mL (0 mLs Intravenous Stopped 02/10/21 1744)    Pertinent labs & imaging results that were available during my care of the patient were reviewed by me and considered in my medical decision making (see chart for details).   Danielle [name] was evaluated in Emergency Department on 02/10/2021 for the symptoms described in the history of present illness. She was evaluated in the context of the global COVID-19 pandemic, which necessitated consideration that the patient might be at risk for infection with the SARS-CoV-2 virus that causes COVID-19. Institutional protocols and algorithms that pertain to the evaluation of patients at risk for COVID-19 are in a state of rapid change based on information released by regulatory bodies including the CDC and federal and state organizations. These policies and algorithms were followed during the patient's care in the ED.   Pt p/w slurred speech and a few days of malaise / constitutional symptoms.  No trauma history.  Doubt stroke or intracranial hemorrhage or intracranial mass or meningitis.  Likely metabolic.  Will check labs, obtain CT head, give IV fluids.   ----------------------------------------- 6:36 PM on 02/10/2021 ----------------------------------------- After fluids, patient states "I feel wonderful."  Speech change has resolved and patient reports feeling back to normal.  Work-up is  reassuring, CT head negative.  Urinalysis is nitrite positive but otherwise without inflammatory markers.  With her symptoms today and age, will treat with Keflex, send a urine culture.  Findings and outpatient plan of care discussed with the patient's daughter.     ____________________________________________   FINAL CLINICAL IMPRESSION(S) / ED DIAGNOSES    Final diagnoses:  Altered mental status, unspecified altered mental status type  Dehydration  Cystitis     ED Discharge Orders          Ordered    cephALEXin (KEFLEX) 500 MG capsule  3 times daily        02/10/21 1835    ondansetron (ZOFRAN ODT) 4 MG disintegrating tablet  Every 8 hours PRN        02/10/21 1836            Portions of this note were generated with dragon dictation software. Dictation errors may occur despite best attempts at proofreading.   Sharman Cheek, MD 02/10/21 Paulo Fruit

## 2021-02-10 NOTE — ED Notes (Signed)
Pt assisted into wheelchair and brought to daughter car outside by this RN.

## 2021-02-10 NOTE — ED Notes (Signed)
Daughter given update.

## 2021-02-10 NOTE — ED Notes (Signed)
Daughter notified of pt discharge. Daughter states she will be on the way to get pt shortly.

## 2021-02-13 LAB — URINE CULTURE: Culture: >=100000 — AB

## 2021-02-22 ENCOUNTER — Ambulatory Visit: Payer: Medicare Other | Admitting: Podiatry

## 2021-02-26 ENCOUNTER — Emergency Department: Payer: Medicare Other

## 2021-02-26 ENCOUNTER — Encounter: Payer: Self-pay | Admitting: Intensive Care

## 2021-02-26 ENCOUNTER — Observation Stay
Admission: EM | Admit: 2021-02-26 | Discharge: 2021-02-27 | Disposition: A | Discharge status: Home / Self Care | Payer: Medicare Other | Attending: Obstetrics and Gynecology | Admitting: Obstetrics and Gynecology

## 2021-02-26 ENCOUNTER — Other Ambulatory Visit: Payer: Self-pay

## 2021-02-26 DIAGNOSIS — K5641 Fecal impaction: Secondary | ICD-10-CM

## 2021-02-26 DIAGNOSIS — F0390 Unspecified dementia without behavioral disturbance: Secondary | ICD-10-CM | POA: Diagnosis not present

## 2021-02-26 DIAGNOSIS — I10 Essential (primary) hypertension: Secondary | ICD-10-CM | POA: Diagnosis not present

## 2021-02-26 DIAGNOSIS — Z20822 Contact with and (suspected) exposure to covid-19: Secondary | ICD-10-CM | POA: Diagnosis not present

## 2021-02-26 DIAGNOSIS — E876 Hypokalemia: Secondary | ICD-10-CM | POA: Insufficient documentation

## 2021-02-26 DIAGNOSIS — E039 Hypothyroidism, unspecified: Secondary | ICD-10-CM | POA: Insufficient documentation

## 2021-02-26 DIAGNOSIS — N179 Acute kidney failure, unspecified: Secondary | ICD-10-CM | POA: Diagnosis not present

## 2021-02-26 DIAGNOSIS — Z7982 Long term (current) use of aspirin: Secondary | ICD-10-CM | POA: Diagnosis not present

## 2021-02-26 DIAGNOSIS — N3281 Overactive bladder: Secondary | ICD-10-CM | POA: Diagnosis not present

## 2021-02-26 DIAGNOSIS — K5900 Constipation, unspecified: Secondary | ICD-10-CM | POA: Diagnosis present

## 2021-02-26 DIAGNOSIS — I2510 Atherosclerotic heart disease of native coronary artery without angina pectoris: Secondary | ICD-10-CM | POA: Diagnosis not present

## 2021-02-26 DIAGNOSIS — K5902 Outlet dysfunction constipation: Secondary | ICD-10-CM | POA: Diagnosis present

## 2021-02-26 DIAGNOSIS — Z79899 Other long term (current) drug therapy: Secondary | ICD-10-CM | POA: Diagnosis not present

## 2021-02-26 LAB — COMPREHENSIVE METABOLIC PANEL
ALT: 13 U/L (ref 0–44)
AST: 22 U/L (ref 15–41)
Albumin: 3.3 g/dL — ABNORMAL LOW (ref 3.5–5.0)
Alkaline Phosphatase: 71 U/L (ref 38–126)
Anion gap: 9 (ref 5–15)
BUN: 28 mg/dL — ABNORMAL HIGH (ref 8–23)
CO2: 25 mmol/L (ref 22–32)
Calcium: 9.7 mg/dL (ref 8.9–10.3)
Chloride: 102 mmol/L (ref 98–111)
Creatinine, Ser: 1.1 mg/dL — ABNORMAL HIGH (ref 0.44–1.00)
GFR, Estimated: 48 mL/min — ABNORMAL LOW (ref >60)
Glucose, Bld: 89 mg/dL (ref 70–99)
Potassium: 3.4 mmol/L — ABNORMAL LOW (ref 3.5–5.1)
Sodium: 136 mmol/L (ref 135–145)
Total Bilirubin: 1 mg/dL (ref 0.3–1.2)
Total Protein: 8 g/dL (ref 6.5–8.1)

## 2021-02-26 LAB — RESP PANEL BY RT-PCR (FLU A&B, COVID) ARPGX2
Influenza A by PCR: NEGATIVE
Influenza B by PCR: NEGATIVE
SARS Coronavirus 2 by RT PCR: NEGATIVE

## 2021-02-26 LAB — CBC
HCT: 30.9 % — ABNORMAL LOW (ref 36.0–46.0)
Hemoglobin: 10.1 g/dL — ABNORMAL LOW (ref 12.0–15.0)
MCH: 29.8 pg (ref 26.0–34.0)
MCHC: 32.7 g/dL (ref 30.0–36.0)
MCV: 91.2 fL (ref 80.0–100.0)
Platelets: 285 10*3/uL (ref 150–400)
RBC: 3.39 MIL/uL — ABNORMAL LOW (ref 3.87–5.11)
RDW: 17.7 % — ABNORMAL HIGH (ref 11.5–15.5)
WBC: 4.3 10*3/uL (ref 4.0–10.5)
nRBC: 0 % (ref 0.0–0.2)

## 2021-02-26 LAB — MAGNESIUM: Magnesium: 2 mg/dL (ref 1.7–2.4)

## 2021-02-26 LAB — LIPASE, BLOOD: Lipase: 32 U/L (ref 11–51)

## 2021-02-26 MED ORDER — POLYETHYLENE GLYCOL 3350 17 G PO PACK
17.0000 g | PACK | ORAL | Status: AC
  Administered 2021-02-26: 17 g via ORAL
  Filled 2021-02-26: qty 1

## 2021-02-26 MED ORDER — MAGNESIUM CITRATE PO SOLN
1.0000 | Freq: Once | ORAL | Status: AC
  Administered 2021-02-26: 1 via ORAL
  Filled 2021-02-26: qty 296

## 2021-02-26 MED ORDER — ACETAMINOPHEN 325 MG PO TABS: 650.0000 mg | ORAL_TABLET | Freq: Four times a day (QID) | ORAL | Status: DC | PRN

## 2021-02-26 MED ORDER — ACETAMINOPHEN 650 MG RE SUPP: 650.0000 mg | Freq: Four times a day (QID) | RECTAL | Status: DC | PRN

## 2021-02-26 MED ORDER — ONDANSETRON HCL 4 MG/2ML IJ SOLN: 4.0000 mg | Freq: Four times a day (QID) | INTRAMUSCULAR | Status: DC | PRN

## 2021-02-26 MED ORDER — ASPIRIN EC 81 MG PO TBEC
81.0000 mg | DELAYED_RELEASE_TABLET | Freq: Every day | ORAL | Status: DC
  Administered 2021-02-27: 81 mg via ORAL
  Filled 2021-02-26: qty 1

## 2021-02-26 MED ORDER — ISOSORBIDE MONONITRATE ER 30 MG PO TB24
30.0000 mg | ORAL_TABLET | Freq: Every day | ORAL | Status: DC
  Administered 2021-02-27: 30 mg via ORAL
  Filled 2021-02-26: qty 1

## 2021-02-26 MED ORDER — SERTRALINE HCL 50 MG PO TABS
100.0000 mg | ORAL_TABLET | Freq: Every day | ORAL | Status: DC
  Administered 2021-02-27: 100 mg via ORAL
  Filled 2021-02-26: qty 2

## 2021-02-26 MED ORDER — ONDANSETRON 4 MG PO TBDP: 4.0000 mg | ORAL_TABLET | Freq: Three times a day (TID) | ORAL | Status: DC | PRN

## 2021-02-26 MED ORDER — ENOXAPARIN SODIUM 60 MG/0.6ML IJ SOSY
55.0000 mg | PREFILLED_SYRINGE | INTRAMUSCULAR | Status: DC
  Administered 2021-02-27: 55 mg via SUBCUTANEOUS
  Filled 2021-02-26: qty 0.6

## 2021-02-26 MED ORDER — ALUM & MAG HYDROXIDE-SIMETH 200-200-20 MG/5ML PO SUSP: 30.0000 mL | ORAL | Status: DC | PRN

## 2021-02-26 MED ORDER — HYDROCHLOROTHIAZIDE 25 MG PO TABS
25.0000 mg | ORAL_TABLET | Freq: Every day | ORAL | Status: DC
  Administered 2021-02-27: 25 mg via ORAL
  Filled 2021-02-26: qty 1

## 2021-02-26 MED ORDER — ONDANSETRON HCL 4 MG PO TABS: 4.0000 mg | ORAL_TABLET | Freq: Four times a day (QID) | ORAL | Status: DC | PRN

## 2021-02-26 MED ORDER — VITAMIN D (ERGOCALCIFEROL) 1.25 MG (50000 UNIT) PO CAPS
50000.0000 [IU] | ORAL_CAPSULE | ORAL | Status: DC
  Administered 2021-02-27: 50000 [IU] via ORAL
  Filled 2021-02-26: qty 1

## 2021-02-26 MED ORDER — POTASSIUM CHLORIDE IN NACL 20-0.9 MEQ/L-% IV SOLN
INTRAVENOUS | Status: DC
  Filled 2021-02-26 (×3): qty 1000

## 2021-02-26 MED ORDER — SENNOSIDES-DOCUSATE SODIUM 8.6-50 MG PO TABS: 1.0000 | ORAL_TABLET | Freq: Every evening | ORAL | Status: DC | PRN

## 2021-02-26 MED ORDER — SODIUM CHLORIDE 0.9 % IV BOLUS
500.0000 mL | Freq: Once | INTRAVENOUS | Status: AC
  Administered 2021-02-26: 500 mL via INTRAVENOUS

## 2021-02-26 MED ORDER — TRAZODONE HCL 50 MG PO TABS: 25.0000 mg | ORAL_TABLET | Freq: Every evening | ORAL | Status: DC | PRN

## 2021-02-26 MED ORDER — POLYETHYLENE GLYCOL 3350 17 G PO PACK: 17.0000 g | PACK | Freq: Every day | ORAL | Status: DC | PRN

## 2021-02-26 MED ORDER — LEVOTHYROXINE SODIUM 50 MCG PO TABS
200.0000 ug | ORAL_TABLET | Freq: Every day | ORAL | Status: DC
  Administered 2021-02-27: 200 ug via ORAL
  Filled 2021-02-26: qty 4

## 2021-02-26 MED ORDER — FLEET ENEMA 7-19 GM/118ML RE ENEM: 1.0000 | ENEMA | Freq: Once | RECTAL | Status: DC | PRN

## 2021-02-26 MED ORDER — ZOLPIDEM TARTRATE 5 MG PO TABS: 5.0000 mg | ORAL_TABLET | Freq: Every evening | ORAL | Status: DC | PRN

## 2021-02-26 MED ORDER — SORBITOL 70 % SOLN
30.0000 mL | Freq: Every day | Status: DC | PRN
  Filled 2021-02-26: qty 30

## 2021-02-26 MED ORDER — RANOLAZINE ER 500 MG PO TB12
500.0000 mg | ORAL_TABLET | Freq: Two times a day (BID) | ORAL | Status: DC
  Filled 2021-02-26 (×5): qty 1

## 2021-02-26 MED ORDER — POTASSIUM CHLORIDE 20 MEQ PO PACK
40.0000 meq | PACK | Freq: Once | ORAL | Status: AC
  Administered 2021-02-27: 40 meq via ORAL
  Filled 2021-02-26: qty 2

## 2021-02-26 MED ORDER — METOPROLOL SUCCINATE ER 50 MG PO TB24
50.0000 mg | ORAL_TABLET | Freq: Every day | ORAL | Status: DC
  Administered 2021-02-27: 50 mg via ORAL
  Filled 2021-02-26: qty 1

## 2021-02-26 MED ORDER — DONEPEZIL HCL 5 MG PO TABS
5.0000 mg | ORAL_TABLET | Freq: Every day | ORAL | Status: DC
  Administered 2021-02-27: 5 mg via ORAL
  Filled 2021-02-26: qty 1

## 2021-02-26 MED ORDER — DIPHENHYDRAMINE HCL 25 MG PO CAPS: 25.0000 mg | ORAL_CAPSULE | Freq: Every evening | ORAL | Status: DC | PRN

## 2021-02-26 MED ORDER — POTASSIUM CHLORIDE CRYS ER 10 MEQ PO TBCR
10.0000 meq | EXTENDED_RELEASE_TABLET | Freq: Two times a day (BID) | ORAL | Status: DC
  Administered 2021-02-27: 10 meq via ORAL
  Filled 2021-02-26: qty 1

## 2021-02-26 MED ORDER — FESOTERODINE FUMARATE ER 4 MG PO TB24: 4.0000 mg | ORAL_TABLET | Freq: Every day | ORAL | Status: DC

## 2021-02-26 MED ORDER — NITROGLYCERIN 0.4 MG SL SUBL: 0.4000 mg | SUBLINGUAL_TABLET | SUBLINGUAL | Status: DC | PRN

## 2021-02-26 NOTE — ED Notes (Signed)
Patient updated on POC. Patient awaiting inpatient bed assignment. Patient lights dimmed for patient comfort at patient request.

## 2021-02-26 NOTE — ED Notes (Signed)
Patient reports she feels more relieved, and small amount of stool noted in bedside commode. Patient assisted back into the bed. Rectal area cleansed with wipes. Warm blankets provided. Xray notified patient ready to be transported.

## 2021-02-26 NOTE — ED Provider Notes (Signed)
[facility] Emergency Department Provider Note   ____________________________________________   Event Date/Time   First MD Initiated Contact with Patient 02/26/21 1552     (approximate)  I have reviewed the triage vital signs and the nursing notes.   HISTORY  Chief Complaint Fecal Impaction and Abdominal Pain    HPI Danielle [name] is a 87 y.o. female history of hyperlipidemia hypertension hypothyroidism  Patient reports that she was dehydrated a couple weeks ago, which review of her chart does indicate as well, and her family and she started using protein drinks.  Since then she started noticing that they seem to be causing her constipation.  For the last 3 days she has been passing gas but not had a bowel movement and feels constipated and full.  She is not in any pain, but reports that she can feel pressure right at her rectum and feels like it is blocked with stool  This is never happened before.  Denies any history of colon problems such as colon cancer.  No bleeding.  No nausea vomiting no fevers or chills.  She reports has been doing fine but she knows that she is blocked up.  Attempted to use a suppository without success (didn't get in rectum)     Past Medical History:  Diagnosis Date   Anxiety    Arthritis    Depression    Dyspnea    with exertion   Headache    migraines   History of myocarditis    Hyperlipemia    Hypertension    Hypocholesterolemia    Hypothyroidism    Myocardial infarction (HCC) 2010   Sleep apnea    uses a cpap    Patient Active Problem List   Diagnosis Date Noted   Pain due to onychomycosis of toenails of both feet 02/15/2019   Urge incontinence of urine 07/03/2017   Coronary arteriosclerosis in native artery 05/29/2014   CAD (coronary artery disease), native coronary artery 05/29/2014   Hyperlipidemia 05/29/2014   Hypertension 05/29/2014   Hyperlipidemia, unspecified 05/29/2014   Pain in extremity  06/21/2013   Onychomycosis due to dermatophyte 06/21/2013    Past Surgical History:  Procedure Laterality Date   ABDOMINAL HYSTERECTOMY     APPENDECTOMY     CATARACT EXTRACTION W/PHACO Left 03/09/2020   Procedure: CATARACT EXTRACTION PHACO AND INTRAOCULAR LENS PLACEMENT (IOC) LEFT;  Surgeon: Lockie Mola, MD;  Location: [facility] ORS;  Service: Ophthalmology;  Laterality: Left;  cde 9.56 us1:32.8 ap10.3%   EYE SURGERY Right    cataract extraction    Prior to Admission medications   Medication Sig Start Date End Date Taking? Authorizing Provider  acetaminophen (TYLENOL) 500 MG tablet 1,000 mg every 8 (eight) hours as needed for moderate pain.     [provider]  alum & mag hydroxide-simeth (MAALOX PLUS) 400-400-40 MG/5ML suspension Take 15 mLs by mouth as needed for indigestion.     [provider]  aspirin EC 81 MG tablet Take 1 tablet by mouth daily. 11/28/14   [provider]  cephALEXin (KEFLEX) 500 MG capsule Take 1 capsule (500 mg total) by mouth 3 (three) times daily. 02/10/21   Sharman Cheek, MD  donepezil (ARICEPT) 5 MG tablet Take 5 mg by mouth at bedtime.    [provider]  hydrochlorothiazide (HYDRODIURIL) 25 MG tablet Take 25 mg by mouth daily.    [provider]  isosorbide mononitrate (IMDUR) 30 MG 24 hr tablet Take 30 mg by mouth daily.  07/01/19   [provider]  levothyroxine (SYNTHROID) 200 MCG tablet Take 200 mcg by mouth daily before breakfast.  03/29/19   [provider]  metoprolol succinate (TOPROL-XL) 50 MG 24 hr tablet Take 50 mg by mouth daily. Take with or immediately following a meal (after breakfast)    [provider]  naproxen sodium (ALEVE) 220 MG tablet Take 220 mg by mouth 2 (two) times daily as needed (pain).    [provider]  nitroGLYCERIN (NITROSTAT) 0.4 MG SL tablet Place 0.4 mg under the tongue every 5 (five) minutes as needed for chest pain.  01/22/17   [provider]  ondansetron (ZOFRAN ODT) 4 MG disintegrating tablet Take 1 tablet (4 mg total) by mouth every 8 (eight) hours as needed for nausea or vomiting. 02/10/21   Sharman CheekStafford, Phillip, MD  polyethylene glycol powder (GLYCOLAX/MIRALAX) powder Take 17 g by mouth daily as needed for moderate constipation.  02/21/17   [provider]  potassium chloride (K-DUR,KLOR-CON) 10 MEQ tablet Take 10 mEq by mouth 2 (two) times daily.  03/02/18   [provider]  ranolazine (RANEXA) 500 MG 12 hr tablet Take by mouth. 06/29/20 06/29/21  [provider]  sertraline (ZOLOFT) 100 MG tablet Take 100 mg by mouth daily.  08/09/17   [provider]  tolterodine (DETROL LA) 4 MG 24 hr capsule Take 1 capsule (4 mg total) by mouth daily. 01/08/21   Alfredo MartinezMacDiarmid, Scott, MD  triamcinolone cream (KENALOG) 0.1 % SMARTSIG:1 Application Topical 2-3 Times Daily 11/28/20   [provider]  Vitamin D, Ergocalciferol, (DRISDOL) 1.25 MG (50000 UNIT) CAPS capsule Take 50,000 Units by mouth every Tuesday.    [provider]    Allergies Ace inhibitors, Penicillins, and Penicillin v potassium  Family History  Problem Relation Age of Onset   Bladder Cancer Neg Hx    Kidney cancer Neg Hx    Breast cancer Neg Hx     Social History Social History   Tobacco Use   Smoking status: Never   Smokeless tobacco: Never  Vaping Use   Vaping Use: Never used  Substance Use Topics   Alcohol use: No   Drug use: No    Review of Systems Constitutional: No fever.  Does not feel weak or fatigued. Eyes: No visual changes. ENT: No sore throat. Cardiovascular: Denies chest pain. Respiratory: Denies shortness of breath. Gastrointestinal: No abdominal pain.  See HPI. Genitourinary: Negative for dysuria. Musculoskeletal: Negative for back pain. Skin: Negative for rash. Neurological: Negative for headaches or weakness.    ____________________________________________   PHYSICAL  EXAM:  VITAL SIGNS: ED Triage Vitals  Enc Vitals Group     BP 02/26/21 1423 (!) 117/47     Pulse Rate 02/26/21 1422 71     Resp 02/26/21 1422 18     Temp 02/26/21 1422 (!) 97.5 F (36.4 C)     Temp Source 02/26/21 1422 Oral     SpO2 02/26/21 1422 99 %     Weight 02/26/21 1423 246 lb (111.6 kg)     Height 02/26/21 1423 5\' 3"  (1.6 m)     Head Circumference --      Peak Flow --      Pain Score 02/26/21 1423 8     Pain Loc --      Pain Edu? --      Excl. in GC? --     Constitutional: Alert and oriented. Well appearing and in no acute distress.  Very  well oriented, very pleasant in no distress. Eyes: Conjunctivae are normal. Head: Atraumatic. Nose: No congestion/rhinnorhea. Mouth/Throat: Mucous membranes are moist. Neck: No stridor.  Cardiovascular: Normal rate, regular rhythm. Grossly normal heart sounds.  Good peripheral circulation. Respiratory: Normal respiratory effort.  No retractions. Lungs CTAB. Gastrointestinal: Soft and nontender. No distention.  Abdomen is not tympanic.  Bowel sounds are normal. Rectal exam: Performed with nurse Katie.  The patient does have apparent palpable impacted stool in the superior portion of the rectum but I am unable to digitally disimpact.  Is formed brown in nature without blood.  Glycerin suppositories noted in the skin folds of the gluteal cleft Musculoskeletal: No lower extremity tenderness nor edema. Neurologic:  Normal speech and language. No gross focal neurologic deficits are appreciated.  Skin:  Skin is warm, dry and intact. No rash noted. Psychiatric: Mood and affect are normal. Speech and behavior are normal.  ____________________________________________   LABS (all labs ordered are listed, but only abnormal results are displayed)  Labs Reviewed  COMPREHENSIVE METABOLIC PANEL - Abnormal; Notable for the following components:      Result Value   Potassium 3.4 (*)    BUN 28 (*)    Creatinine, Ser 1.10 (*)    Albumin 3.3 (*)     GFR, Estimated 48 (*)    All other components within normal limits  CBC - Abnormal; Notable for the following components:   RBC 3.39 (*)    Hemoglobin 10.1 (*)    HCT 30.9 (*)    RDW 17.7 (*)    All other components within normal limits  RESP PANEL BY RT-PCR (FLU A&B, COVID) ARPGX2  LIPASE, BLOOD  URINALYSIS, COMPLETE (UACMP) WITH MICROSCOPIC   ____________________________________________  EKG   ____________________________________________  RADIOLOGY  DG Abd 2 Views  Result Date: 02/26/2021 CLINICAL DATA:  Question impaction or obstruction EXAM: ABDOMEN - 2 VIEW COMPARISON:  CT 08/29/2017 FINDINGS: No free air beneath the diaphragm. Nonobstructed gas pattern with moderate stool in the rectum. No radiopaque calculi. Advanced degenerative changes of both hips. IMPRESSION: Nonobstructed gas pattern Electronically Signed   By: Jasmine Pang M.D.   On: 02/26/2021 18:33    X-ray negative for obstruction. Moderate rectal stool noted (after enema given and initial impaction)  ____________________________________________   PROCEDURES  Procedure(s) performed: None  Procedures  Critical Care performed: No  ____________________________________________   INITIAL IMPRESSION / ASSESSMENT AND PLAN / ED COURSE  Pertinent labs & imaging results that were available during my care of the patient were reviewed by me and considered in my medical decision making (see chart for details).   Patient reports feeling as though she has a rectal blockage.  She does have apparent rectal impaction.  Very reassuring clinical exam vital signs and lab work.  Does not appear to have any associated abdominal pain thus feel the likelihood of acute intra-abdominal catastrophe or infection bowel obstruction etc. are extremely low.  No associated vomiting no nausea.  Still passing flatus just not having a bowel movement and feels constipated for 3 days.  Digital exam demonstrates fecal impaction, moderate in  size, but it is superior within the rectum unable to be disimpacted digitally.  Soapsuds enema   Clinical Course as of 02/26/21 1920  Mon Feb 26, 2021  1702 Nursing provided soapsuds enema, patient is now having loose stools, defecating with good effect.  Will obtain imaging study to evaluate for obstructive abnormality, but patient is showing evidence of improvement as well as relief of constipation. [  MQ]  1912 On exam, patient continues to develop rectal pressure.  Digital rectal exam performed with nurse Amy, patient does still have what appears to be fecal impaction in the superior portion of the rectum.  She has been stooling with primarily liquidy to thickened but not hard stool.  Suspect ongoing rectal impaction.  Discussed with patient and daughter at the bedside, and given her age, fragility, recent evaluation for dehydration, discussed risk benefits and patient amenable and agreeable to plan for admission for further treatments.  Have ordered MiraLAX and magnesium citrate at this time.  Also IV fluid [MQ]    Clinical Course User Index [MQ] Sharyn Creamer, MD   Admission discussed and accepted by Dr. Arville Care ____________________________________________   FINAL CLINICAL IMPRESSION(S) / ED DIAGNOSES  Final diagnoses:  Constipation by outlet dysfunction  Fecal impaction in rectum [facility])        Note:  This document was prepared using Dragon voice recognition software and may include unintentional dictation errors   Sharyn Creamer, MD 02/26/21 1921

## 2021-02-26 NOTE — H&P (Signed)
[place]   PATIENT NAME: Danielle [name]    MR#:  [id number]  DATE OF BIRTH:  1928/09/24  DATE OF ADMISSION:  02/26/2021  PRIMARY CARE PHYSICIAN: Hillery Aldo, MD   Patient is coming from: Home  REQUESTING/REFERRING PHYSICIAN: Sharyn Creamer, MD  CHIEF COMPLAINT:   Chief Complaint  Patient presents with   Fecal Impaction   Abdominal Pain    HISTORY OF PRESENT ILLNESS:  Danielle [name] is a 87 y.o. female with medical history significant for hypertension, depression, dyslipidemia, hypothyroidism, coronary artery disease, obstructive sleep apnea on CPAP, anxiety and osteoarthritis, who presented to the ER with acute onset of severe constipation.  The patient's last bowel movement was 3 days ago.  She denies any abdominal pain.  No melena or bright red bleeding per rectum.  She did not have any preceding diarrhea.  No nausea or vomiting.  No dysuria, oliguria or hematuria or flank pain.  ED Course: When she came to the ER vital signs were within normal.  Labs revealed hypokalemia of 3.4 and a BUN of 28 with creatinine 1.1 compared with 1.04 on 6/11.  Albumin was 3.3 with total protein of 8.  CBC showed anemia close to baseline.  Applause antigens and COVID-19 PCR came back negative.     Imaging: 2 view abdomen x-ray revealed nonobstructive bowel gas pattern.  The patient was given a bottle of mag citrate 500 mill IV normal saline bolus, MiraLAX and soapsuds enema.  An attempt at disimpaction resulted in small amount of liquid stools.  It seemed she has an infection up higher than reachable by rectal exam.  She will be admitted to an observation medical bed for further evaluation and management.  PAST MEDICAL HISTORY:   Past Medical History:  Diagnosis Date   Anxiety    Arthritis    Depression    Dyspnea    with exertion   Headache    migraines   History of myocarditis    Hyperlipemia    Hypertension    Hypocholesterolemia    Hypothyroidism    Myocardial infarction  (HCC) 2010   Sleep apnea    uses a cpap    PAST SURGICAL HISTORY:   Past Surgical History:  Procedure Laterality Date   ABDOMINAL HYSTERECTOMY     APPENDECTOMY     CATARACT EXTRACTION W/PHACO Left 03/09/2020   Procedure: CATARACT EXTRACTION PHACO AND INTRAOCULAR LENS PLACEMENT (IOC) LEFT;  Surgeon: Lockie Mola, MD;  Location: [facility] ORS;  Service: Ophthalmology;  Laterality: Left;  cde 9.56 us1:32.8 ap10.3%   EYE SURGERY Right    cataract extraction    SOCIAL HISTORY:   Social History   Tobacco Use   Smoking status: Never   Smokeless tobacco: Never  Substance Use Topics   Alcohol use: No    FAMILY HISTORY:   Family History  Problem Relation Age of Onset   Bladder Cancer Neg Hx    Kidney cancer Neg Hx    Breast cancer Neg Hx     DRUG ALLERGIES:   Allergies  Allergen Reactions   Ace Inhibitors Other (See Comments)    Angiodema to Ace-inhibitor 2013   Penicillins Rash    SEVERE RASH. Has patient had a PCN reaction causing immediate rash, facial/tongue/throat swelling, SOB or lightheadedness with hypotension: Unknown Has patient had a PCN reaction causing severe rash involving mucus membranes or skin necrosis: Unknown Has patient had a PCN reaction that required hospitalization: Unknown Has patient had a PCN reaction  occurring within the last 10 years: Unknown If all of the above answers are "NO", then may proceed with Cephalosporin use. Unknown    Penicillin V Potassium Nausea And Vomiting and Rash    REVIEW OF SYSTEMS:   ROS As per history of present illness. All pertinent systems were reviewed above. Constitutional, HEENT, cardiovascular, respiratory, GI, GU, musculoskeletal, neuro, psychiatric, endocrine, integumentary and hematologic systems were reviewed and are otherwise negative/unremarkable except for positive findings mentioned above in the HPI.   MEDICATIONS AT HOME:   Prior to Admission medications   Medication Sig Start Date End Date  Taking? Authorizing Provider  acetaminophen (TYLENOL) 500 MG tablet 1,000 mg every 8 (eight) hours as needed for moderate pain.     [provider]  alum & mag hydroxide-simeth (MAALOX PLUS) 400-400-40 MG/5ML suspension Take 15 mLs by mouth as needed for indigestion.     [provider]  aspirin EC 81 MG tablet Take 1 tablet by mouth daily. 11/28/14   [provider]  cephALEXin (KEFLEX) 500 MG capsule Take 1 capsule (500 mg total) by mouth 3 (three) times daily. 02/10/21   Sharman Cheek, MD  donepezil (ARICEPT) 5 MG tablet Take 5 mg by mouth at bedtime.    [provider]  hydrochlorothiazide (HYDRODIURIL) 25 MG tablet Take 25 mg by mouth daily.    [provider]  isosorbide mononitrate (IMDUR) 30 MG 24 hr tablet Take 30 mg by mouth daily.  07/01/19   [provider]  levothyroxine (SYNTHROID) 200 MCG tablet Take 200 mcg by mouth daily before breakfast.  03/29/19   [provider]  metoprolol succinate (TOPROL-XL) 50 MG 24 hr tablet Take 50 mg by mouth daily. Take with or immediately following a meal (after breakfast)    [provider]  naproxen sodium (ALEVE) 220 MG tablet Take 220 mg by mouth 2 (two) times daily as needed (pain).    [provider]  nitroGLYCERIN (NITROSTAT) 0.4 MG SL tablet Place 0.4 mg under the tongue every 5 (five) minutes as needed for chest pain.  01/22/17   [provider]  ondansetron (ZOFRAN ODT) 4 MG disintegrating tablet Take 1 tablet (4 mg total) by mouth every 8 (eight) hours as needed for nausea or vomiting. 02/10/21   Sharman Cheek, MD  polyethylene glycol powder (GLYCOLAX/MIRALAX) powder Take 17 g by mouth daily as needed for moderate constipation.  02/21/17   [provider]  potassium chloride (K-DUR,KLOR-CON) 10 MEQ tablet Take 10 mEq by mouth 2 (two) times daily.  03/02/18   [provider]  ranolazine (RANEXA) 500 MG 12 hr tablet Take by mouth. 06/29/20  06/29/21  [provider]  sertraline (ZOLOFT) 100 MG tablet Take 100 mg by mouth daily.  08/09/17   [provider]  tolterodine (DETROL LA) 4 MG 24 hr capsule Take 1 capsule (4 mg total) by mouth daily. 01/08/21   Alfredo Martinez, MD  triamcinolone cream (KENALOG) 0.1 % SMARTSIG:1 Application Topical 2-3 Times Daily 11/28/20   [provider]  Vitamin D, Ergocalciferol, (DRISDOL) 1.25 MG (50000 UNIT) CAPS capsule Take 50,000 Units by mouth every Tuesday.    [provider]      VITAL SIGNS:  Blood pressure 130/75, pulse 68, temperature (!) 97.5 F (36.4 C), temperature source Oral, resp. rate 16, height 5\' 3"  (1.6 m), weight 111.6 kg, SpO2 100 %.  PHYSICAL EXAMINATION:  Physical Exam  GENERAL:  87 y.o.-year-old patient lying in the bed with no acute distress.  EYES: Pupils equal, round, reactive to light and accommodation. No scleral icterus. Extraocular muscles intact.  HEENT: Head atraumatic, normocephalic. Oropharynx and nasopharynx clear.  NECK:  Supple, no jugular venous distention. No thyroid enlargement, no tenderness.  LUNGS: Normal breath sounds bilaterally, no wheezing, rales,rhonchi or crepitation. No use of accessory muscles of respiration.  CARDIOVASCULAR: Regular rate and rhythm, S1, S2 normal. No murmurs, rubs, or gallops.  ABDOMEN: Soft, nondistended, nontender. Bowel sounds are slightly diminished.  No organomegaly or mass.  EXTREMITIES: No pedal edema, cyanosis, or clubbing.  NEUROLOGIC: Cranial nerves II through XII are intact. Muscle strength 5/5 in all extremities. Sensation intact. Gait not checked.  PSYCHIATRIC: The patient is alert and oriented x 3.  Normal affect and good eye contact. SKIN: No obvious rash, lesion, or ulcer.   LABORATORY PANEL:   CBC Recent Labs  Lab 02/26/21 1427  WBC 4.3  HGB 10.1*  HCT 30.9*  PLT 285    ------------------------------------------------------------------------------------------------------------------  Chemistries  Recent Labs  Lab 02/26/21 1427  NA 136  K 3.4*  CL 102  CO2 25  GLUCOSE 89  BUN 28*  CREATININE 1.10*  CALCIUM 9.7  AST 22  ALT 13  ALKPHOS 71  BILITOT 1.0   ------------------------------------------------------------------------------------------------------------------  Cardiac Enzymes No results for input(s): TROPONINI in the last 168 hours. ------------------------------------------------------------------------------------------------------------------  RADIOLOGY:  DG Abd 2 Views  Result Date: 02/26/2021 CLINICAL DATA:  Question impaction or obstruction EXAM: ABDOMEN - 2 VIEW COMPARISON:  CT 08/29/2017 FINDINGS: No free air beneath the diaphragm. Nonobstructed gas pattern with moderate stool in the rectum. No radiopaque calculi. Advanced degenerative changes of both hips. IMPRESSION: Nonobstructed gas pattern Electronically Signed   By: Jasmine Pang M.D.   On: 02/26/2021 18:33      IMPRESSION AND PLAN:  Active Problems:   Constipation  1.  Severe constipation with possible fecal impaction. - The patient will be admitted to an observation medical bed. - She will be hydrated with IV normal saline. - We will place her on as needed Dulcolax, sorbitol and Fleet enema in addition to that she was given in addition to daily MiraLAX.  2.  Hypokalemia mild acute kidney injury. - The patient will be hydrated with IV normal saline and will follow BMP. - We will replace potassium and check mag level.  3.  Hypothyroidism. - We will continue Synthroid and check TSH level as uncontrolled hypothyroidism could be contributing to her constipation.  4.  Coronary artery disease. - We will continue Imdur, aspirin and Ranexa.  5.  Essential hypertension. - We will continue Imdur and hold HCTZ for now.  6.  Early dementia and anxiety. - We will  continue Aricept and Zoloft.  7.  Overactive bladder. - We will continue Detrol LA.  DVT prophylaxis: Lovenox.  Code Status: She is DNR/DNI.  This was discussed with her.   Family Communication:  The plan of care was discussed in details with the patient (and family). I answered all questions. The patient agreed to proceed with the above mentioned plan. Further management will depend upon hospital course. Disposition Plan: Back to previous home environment Consults called: none.  All the records are reviewed and case discussed with ED provider.  Status is: Observation  The patient remains OBS appropriate and will d/c before 2 midnights.  Dispo: The patient is from: Home              Anticipated d/c is to: Home  Patient currently is not medically stable to d/c.   Difficult to place patient No  TOTAL TIME TAKING CARE OF THIS PATIENT: 55 minutes.    Hannah BeatJan A Jaydrian Corpening M.D on 02/26/2021 at 7:30 PM  [facility]   From 7 PM-7 AM, contact night-coverage [email or web address]  CC: Primary care physician; Hillery AldoPatel, Sarah, MD

## 2021-02-26 NOTE — ED Triage Notes (Signed)
Pt comes into the ED via EMS from home with c/o not having a BM in the past 3 days, took OTC meds with no relief  138/66 98%RA 74HR

## 2021-02-26 NOTE — ED Notes (Signed)
Patient is resting comfortably. 

## 2021-02-26 NOTE — ED Notes (Signed)
Patient transported to X-ray 

## 2021-02-26 NOTE — ED Notes (Signed)
Patient reports last normal BM 3 days ago. Patient reports intermittent lower abdominal pain as well. Patient reports "feeling like there's stool in there." Patient reports taking stool softeners without improvement. Patient denies any nausea or vomiting.

## 2021-02-26 NOTE — ED Notes (Signed)
After instilling of soap suds, patient noted to have moderate stool passage. Patient reports still feeling the urge to defecate, so patient placed on bedside commode to attempt further BM. Dr. Fanny Bien aware.

## 2021-02-26 NOTE — ED Triage Notes (Signed)
PAtient c/o fecal impaction and lower abdominal pain. Last BM X3 days ago

## 2021-02-26 NOTE — ED Notes (Signed)
ED Provider at bedside. 

## 2021-02-26 NOTE — ED Notes (Signed)
Daughter updated on POC.

## 2021-02-26 NOTE — ED Notes (Signed)
Patient continues to attempt BM on bedside commode. Call light within reach.

## 2021-02-26 NOTE — ED Notes (Signed)
Patient is resting comfortably. Dr. Arville Care at bedside.

## 2021-02-26 NOTE — Progress Notes (Signed)
PHARMACIST - PHYSICIAN COMMUNICATION  CONCERNING:  Enoxaparin (Lovenox) for DVT Prophylaxis    RECOMMENDATION: Patient was prescribed enoxaprin 40mg  q24 hours for VTE prophylaxis.   Filed Weights   02/26/21 1423  Weight: 111.6 kg (246 lb)    Body mass index is 43.58 kg/m.  Estimated Creatinine Clearance: 40.8 mL/min (A) (by C-G formula based on SCr of 1.1 mg/dL (H)).   Based on [facility] policy patient is candidate for enoxaparin 0.5mg /kg TBW SQ every 24 hours based on BMI being >30.   DESCRIPTION: Pharmacy has adjusted enoxaparin dose per [facility] policy.  Patient is now receiving enoxaparin 55 mg every 24 hours    [facility], PharmD, BCPS Clinical Pharmacist  02/26/2021 7:36 PM

## 2021-02-26 NOTE — ED Notes (Signed)
Patient moved from Cpod to Dpod pending admission. Patient updated on POC. Patient aware of pending evaluation by inpatient MD. Patient repositioned in bed for comfort. Patient denies needs at this time.

## 2021-02-27 DIAGNOSIS — K5900 Constipation, unspecified: Secondary | ICD-10-CM | POA: Diagnosis not present

## 2021-02-27 DIAGNOSIS — K5902 Outlet dysfunction constipation: Secondary | ICD-10-CM | POA: Diagnosis not present

## 2021-02-27 LAB — CBC
HCT: 31.2 % — ABNORMAL LOW (ref 36.0–46.0)
Hemoglobin: 9.9 g/dL — ABNORMAL LOW (ref 12.0–15.0)
MCH: 29.5 pg (ref 26.0–34.0)
MCHC: 31.7 g/dL (ref 30.0–36.0)
MCV: 92.9 fL (ref 80.0–100.0)
Platelets: 208 10*3/uL (ref 150–400)
RBC: 3.36 MIL/uL — ABNORMAL LOW (ref 3.87–5.11)
RDW: 18.4 % — ABNORMAL HIGH (ref 11.5–15.5)
WBC: 3.3 10*3/uL — ABNORMAL LOW (ref 4.0–10.5)
nRBC: 0 % (ref 0.0–0.2)

## 2021-02-27 LAB — BASIC METABOLIC PANEL
Anion gap: 9 (ref 5–15)
BUN: 26 mg/dL — ABNORMAL HIGH (ref 8–23)
CO2: 23 mmol/L (ref 22–32)
Calcium: 9.4 mg/dL (ref 8.9–10.3)
Chloride: 108 mmol/L (ref 98–111)
Creatinine, Ser: 0.99 mg/dL (ref 0.44–1.00)
GFR, Estimated: 54 mL/min — ABNORMAL LOW (ref >60)
Glucose, Bld: 72 mg/dL (ref 70–99)
Potassium: 4.1 mmol/L (ref 3.5–5.1)
Sodium: 140 mmol/L (ref 135–145)

## 2021-02-27 LAB — TSH: TSH: 0.142 u[IU]/mL — ABNORMAL LOW (ref 0.350–4.500)

## 2021-02-27 MED ORDER — SENNA 8.6 MG PO TABS: 1.0000 | ORAL_TABLET | Freq: Every day | ORAL | 0 refills | Status: DC

## 2021-02-27 MED ORDER — POLYETHYLENE GLYCOL 3350 17 GM/SCOOP PO POWD: 17.0000 g | Freq: Every day | ORAL | 0 refills | Status: DC

## 2021-02-27 NOTE — ED Notes (Signed)
Patient updated on POC. Patient provided phone to contact daughter.

## 2021-02-27 NOTE — Plan of Care (Signed)

## 2021-02-27 NOTE — Progress Notes (Signed)
DISCHARGE NOTE:  Pt given discharge instructions. Pt verbalized understanding. Pt wheeled to car by volunteers. Daughter providing transportation.

## 2021-02-27 NOTE — Discharge Summary (Signed)
Danielle [name] BMW:[id number] DOB: 1929/07/03 DOA: 02/26/2021  PCP: Hillery Aldo, MD  Admit date: 02/26/2021 Discharge date: 02/27/2021  Time spent: 35 minutes  Recommendations for Outpatient Follow-up:  Pcp f/u 1 week  F/u TSH (result pending at time of discharge) Repeat potassium level at pcp f/u    Discharge Diagnoses:  Active Problems:   Constipation   Discharge Condition: fair  Diet recommendation: high fiber  Filed Weights   02/26/21 1423  Weight: 111.6 kg    History of present illness:  Danielle [name] is a 87 y.o. female with medical history significant for hypertension, depression, dyslipidemia, hypothyroidism, coronary artery disease, obstructive sleep apnea on CPAP, anxiety and osteoarthritis, who presented to the ER with acute onset of severe constipation.  The patient's last bowel movement was 3 days ago.  She denies any abdominal pain.  No melena or bright red bleeding per rectum.  She did not have any preceding diarrhea.  No nausea or vomiting.  No dysuria, oliguria or hematuria or flank pain.  Hospital Course:  Two attempts by ED provider at disempaction in ED but stool was too high. Was treated with several enemas as well as stool softeners and ultimately had several large bowel movements with resolution of abdominal pain and distention. Tolerating diet. Mild hypokalemia was treated. Will be discharged with bowel regimen and PCP f/u. TSH drawn and pending at time of discharge. Did have mild hypokalemia that was treated and resolved, will need attention to this at pcp f/u as hypokalemia can contribute to chronic constipation  Procedures: none   Consultations: none  Discharge Exam: Vitals:   02/27/21 0516 02/27/21 0842  BP: (!) 177/61 (!) 154/61  Pulse: 71 73  Resp: 16 17  Temp: 97.8 F (36.6 C) 98.2 F (36.8 C)  SpO2: 97%     General: NAD Cardiovascular: RRR, soft systolic murmur Respiratory: CTAB Abdomen: obese, soft, non-tender  Discharge  Instructions   Discharge Instructions     Diet - low sodium heart healthy   Complete by: As directed    Increase activity slowly   Complete by: As directed       Allergies as of 02/27/2021       Reactions   Ace Inhibitors Other (See Comments)   Angiodema to Ace-inhibitor 2013   Penicillins Rash   SEVERE RASH. Has patient had a PCN reaction causing immediate rash, facial/tongue/throat swelling, SOB or lightheadedness with hypotension: Unknown Has patient had a PCN reaction causing severe rash involving mucus membranes or skin necrosis: Unknown Has patient had a PCN reaction that required hospitalization: Unknown Has patient had a PCN reaction occurring within the last 10 years: Unknown If all of the above answers are "NO", then may proceed with Cephalosporin use. Unknown   Penicillin V Potassium Nausea And Vomiting, Rash        Medication List     STOP taking these medications    cephALEXin 500 MG capsule Commonly known as: KEFLEX   tolterodine 4 MG 24 hr capsule Commonly known as: DETROL LA       TAKE these medications    acetaminophen 500 MG tablet Commonly known as: TYLENOL 1,000 mg every 8 (eight) hours as needed for moderate pain.   alum & mag hydroxide-simeth 400-400-40 MG/5ML suspension Commonly known as: MAALOX PLUS Take 15 mLs by mouth as needed for indigestion.   aspirin EC 81 MG tablet Take 1 tablet by mouth daily.   diphenhydrAMINE 25 mg capsule Commonly known as: BENADRYL Take 25  mg by mouth at bedtime as needed for sleep.   donepezil 5 MG tablet Commonly known as: ARICEPT Take 5 mg by mouth at bedtime.   hydrochlorothiazide 25 MG tablet Commonly known as: HYDRODIURIL Take 25 mg by mouth daily.   isosorbide mononitrate 60 MG 24 hr tablet Commonly known as: IMDUR Take 60 mg by mouth daily.   levothyroxine 200 MCG tablet Commonly known as: SYNTHROID Take 200 mcg by mouth daily before breakfast.   naproxen sodium 220 MG  tablet Commonly known as: ALEVE Take 220 mg by mouth 2 (two) times daily as needed (pain).   nitroGLYCERIN 0.4 MG SL tablet Commonly known as: NITROSTAT Place 0.4 mg under the tongue every 5 (five) minutes as needed for chest pain.   ondansetron 4 MG disintegrating tablet Commonly known as: Zofran ODT Take 1 tablet (4 mg total) by mouth every 8 (eight) hours as needed for nausea or vomiting.   polyethylene glycol powder 17 GM/SCOOP powder Commonly known as: GLYCOLAX/MIRALAX Take 17 g by mouth daily. Can increase to up to 51 grams daily, goal 1-2 soft formed bowel movements daily What changed:  when to take this reasons to take this additional instructions   potassium chloride 10 MEQ tablet Commonly known as: KLOR-CON Take 10 mEq by mouth 2 (two) times daily.   senna 8.6 MG Tabs tablet Commonly known as: SENOKOT Take 1 tablet (8.6 mg total) by mouth daily.   sertraline 100 MG tablet Commonly known as: ZOLOFT Take 100 mg by mouth daily.       Allergies  Allergen Reactions   Ace Inhibitors Other (See Comments)    Angiodema to Ace-inhibitor 2013   Penicillins Rash    SEVERE RASH. Has patient had a PCN reaction causing immediate rash, facial/tongue/throat swelling, SOB or lightheadedness with hypotension: Unknown Has patient had a PCN reaction causing severe rash involving mucus membranes or skin necrosis: Unknown Has patient had a PCN reaction that required hospitalization: Unknown Has patient had a PCN reaction occurring within the last 10 years: Unknown If all of the above answers are "NO", then may proceed with Cephalosporin use. Unknown    Penicillin V Potassium Nausea And Vomiting and Rash      The results of significant diagnostics from this hospitalization (including imaging, microbiology, ancillary and laboratory) are listed below for reference.    Significant Diagnostic Studies: DG Chest 2 View  Result Date: 02/04/2021 CLINICAL DATA:  Chest pain. EXAM:  CHEST - 2 VIEW COMPARISON:  None. FINDINGS: The heart size and mediastinal contours are within normal limits. Both lungs are clear. The visualized skeletal structures are unremarkable. IMPRESSION: No acute cardiopulmonary process. Electronically Signed   By: Genevive BiStewart  Edmunds M.D.   On: 02/04/2021 09:36   CT Head Wo Contrast  Result Date: 02/10/2021 CLINICAL DATA:  Altered mental status.  Slowed speech. EXAM: CT HEAD WITHOUT CONTRAST TECHNIQUE: Contiguous axial images were obtained from the base of the skull through the vertex without intravenous contrast. COMPARISON:  Report dated 08/09/2012 FINDINGS: Brain: Mildly to moderately enlarged ventricles and subarachnoid spaces. 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 Small bilateral subdural hygromas. No intracranial hemorrhage, mass lesion or CT evidence of acute infarction. Vascular: No hyperdense vessel or unexpected calcification. Skull: Normal. Negative for fracture or focal lesion. Sinuses/Orbits: Status post bilateral cataract extraction. Unremarkable bones and included paranasal sinuses. Other: Left concha bullosa. Deviation of the midportion of the nasal septum to the right. IMPRESSION: 1. No acute abnormality. 2. Mild-to-moderate diffuse cerebral atrophy. 3. 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 4. Small bilateral chronic subdural hygromas. Electronically Signed   By: Beckie Salts M.D.   On: 02/10/2021 17:14   DG Abd 2 Views  Result Date: 02/26/2021 CLINICAL DATA:  Question impaction or obstruction EXAM: ABDOMEN - 2 VIEW COMPARISON:  CT 08/29/2017 FINDINGS: No free air beneath the diaphragm. Nonobstructed gas pattern with moderate stool in the rectum. No radiopaque calculi. Advanced degenerative changes of both hips. IMPRESSION: Nonobstructed gas pattern Electronically Signed   By: Jasmine Pang M.D.   On: 02/26/2021 18:33    Microbiology: Recent Results (from the past 240 hour(s))  Resp Panel by RT-PCR (Flu A&B, Covid)  Nasopharyngeal Swab     Status: None   Collection Time: 02/26/21  7:26 PM   Specimen: Nasopharyngeal Swab; Nasopharyngeal(NP) swabs in vial transport medium  Result Value Ref Range Status   SARS Coronavirus 2 by RT PCR NEGATIVE NEGATIVE Final    Comment: (NOTE) SARS-CoV-2 target nucleic acids are NOT DETECTED.  The SARS-CoV-2 RNA is generally detectable in upper respiratory specimens during the acute phase of infection. The lowest concentration of SARS-CoV-2 viral copies this assay can detect is 138 copies/mL. A negative result does not preclude SARS-Cov-2 infection and should not be used as the sole basis for treatment or other patient management decisions. A negative result may occur with  improper specimen collection/handling, submission of specimen other than nasopharyngeal swab, presence of viral mutation(s) within the areas targeted by this assay, and inadequate number of viral copies(<138 copies/mL). A negative result must be combined with clinical observations, patient history, and epidemiological information. The expected result is Negative.  Fact Sheet for Patients:  BloggerCourse.com  Fact Sheet for Healthcare Providers:  SeriousBroker.it  This test is no t yet approved or cleared by the [place] FDA and  has been authorized for detection and/or diagnosis of SARS-CoV-2 by FDA under an Emergency Use Authorization (EUA). This EUA will remain  in effect (meaning this test can be used) for the duration of the COVID-19 declaration under Section 564(b)(1) of the Act, 21 U.S.C.section 360bbb-3(b)(1), unless the authorization is terminated  or revoked sooner.       Influenza A by PCR NEGATIVE NEGATIVE Final   Influenza B by PCR NEGATIVE NEGATIVE Final    Comment: (NOTE) The Xpert Xpress SARS-CoV-2/FLU/RSV plus assay is intended as an aid in the diagnosis of influenza from Nasopharyngeal swab specimens and should not be  used as a sole basis for treatment. Nasal washings and aspirates are unacceptable for Xpert Xpress SARS-CoV-2/FLU/RSV testing.  Fact Sheet for Patients: BloggerCourse.com  Fact Sheet for Healthcare Providers: SeriousBroker.it  This test is not yet approved or cleared by the [place] FDA and has been authorized for detection and/or diagnosis of SARS-CoV-2 by FDA under an Emergency Use Authorization (EUA). This EUA will remain in effect (meaning this test can be used) for the duration of the COVID-19 declaration under Section 564(b)(1) of the Act, 21 U.S.C. section 360bbb-3(b)(1), unless the authorization is terminated or revoked.  Performed at [facility], [address] [place], [place] [address]      Labs: Basic Metabolic Panel: Recent Labs  Lab 02/26/21 1427 02/26/21 2026 02/27/21 0647  NA 136  --  140  K 3.4*  --  4.1  CL 102  --  108  CO2 25  --  23  GLUCOSE 89  --  72  BUN 28*  --  26*  CREATININE 1.10*  --  0.99  CALCIUM 9.7  --  9.4  MG  --  2.0  --    Liver Function Tests: Recent Labs  Lab 02/26/21 1427  AST 22  ALT 13  ALKPHOS 71  BILITOT 1.0  PROT 8.0  ALBUMIN 3.3*   Recent Labs  Lab 02/26/21 1427  LIPASE 32   No results for input(s): AMMONIA in the last 168 hours. CBC: Recent Labs  Lab 02/26/21 1427 02/27/21 0647  WBC 4.3 3.3*  HGB 10.1* 9.9*  HCT 30.9* 31.2*  MCV 91.2 92.9  PLT 285 208   Cardiac Enzymes: No results for input(s): CKTOTAL, CKMB, CKMBINDEX, TROPONINI in the last 168 hours. BNP: BNP (last 3 results) Recent Labs    02/04/21 0848  BNP 190.8*    ProBNP (last 3 results) No results for input(s): PROBNP in the last 8760 hours.  CBG: No results for input(s): GLUCAP in the last 168 hours.     Signed:  Silvano Bilis MD.  [facility] 02/27/2021, 9:32 AM

## 2021-04-16 ENCOUNTER — Telehealth: Payer: Medicare Other

## 2021-04-16 ENCOUNTER — Telehealth: Payer: Self-pay | Admitting: Urology

## 2021-04-16 NOTE — Telephone Encounter (Signed)
Patient called requesting pain medication for leg/thigh pain. She wondered if this could be a result of her PTNS treatment. It was explained that her last treatment was in 4/22 and would be very unlikely to be the cause. She was encouraged to follow up with PCP for further evaluation, patient verbalized understanding

## 2021-04-16 NOTE — Telephone Encounter (Signed)
Patient's daughter called stating that she is having leg,ankle pain and they want to know if it can be from the PTNS treatments?  Please advise

## 2021-04-24 ENCOUNTER — Ambulatory Visit: Payer: Medicare Other | Admitting: Internal Medicine

## 2021-05-08 ENCOUNTER — Ambulatory Visit: Payer: Medicare Other | Admitting: Internal Medicine

## 2021-05-10 ENCOUNTER — Ambulatory Visit: Payer: Medicare Other | Admitting: Internal Medicine

## 2021-06-05 ENCOUNTER — Ambulatory Visit (INDEPENDENT_AMBULATORY_CARE_PROVIDER_SITE_OTHER): Payer: Medicare Other | Admitting: Internal Medicine

## 2021-06-05 ENCOUNTER — Encounter (INDEPENDENT_AMBULATORY_CARE_PROVIDER_SITE_OTHER): Payer: Self-pay

## 2021-06-05 ENCOUNTER — Other Ambulatory Visit: Payer: Self-pay

## 2021-06-05 ENCOUNTER — Encounter: Payer: Self-pay | Admitting: Internal Medicine

## 2021-06-05 VITALS — BP 138/68 | HR 77 | Temp 98.4°F | Resp 16 | Ht 63.0 in

## 2021-06-05 DIAGNOSIS — Z9989 Dependence on other enabling machines and devices: Secondary | ICD-10-CM

## 2021-06-05 DIAGNOSIS — Z7189 Other specified counseling: Secondary | ICD-10-CM | POA: Diagnosis not present

## 2021-06-05 DIAGNOSIS — G4733 Obstructive sleep apnea (adult) (pediatric): Secondary | ICD-10-CM

## 2021-06-05 DIAGNOSIS — J449 Chronic obstructive pulmonary disease, unspecified: Secondary | ICD-10-CM

## 2021-06-05 NOTE — Progress Notes (Signed)
[facility] [address] [place], [place] [address]  Pulmonary Sleep Medicine   Office Visit Note  Patient Name: Danielle [name] DOB: [birth year] MRN [id number]  Date of Service: 06/05/2021  Complaints/HPI: OSA. She states her machine is not working and needs to have a replacement. She has no recent sleep study. Will need to see if it will be possible to get a machine based on her previous data.  Patient states that since her machine is not working her symptoms have come back feels a little bit more tired.  Denies having any chest pain no palpitations.  No GI symptoms.  She has lost some weight also  ROS  General: (-) fever, (-) chills, (-) night sweats, (-) weakness Skin: (-) rashes, (-) itching,. Eyes: (-) visual changes, (-) redness, (-) itching. Nose and Sinuses: (-) nasal stuffiness or itchiness, (-) postnasal drip, (-) nosebleeds, (-) sinus trouble. Mouth and Throat: (-) sore throat, (-) hoarseness. Neck: (-) swollen glands, (-) enlarged thyroid, (-) neck pain. Respiratory: - cough, (-) bloody sputum, - shortness of breath, - wheezing. Cardiovascular: - ankle swelling, (-) chest pain. Lymphatic: (-) lymph node enlargement. Neurologic: (-) numbness, (-) tingling. Psychiatric: (-) anxiety, (-) depression   Current Medication: Outpatient Encounter Medications as of 06/05/2021  Medication Sig   acetaminophen (TYLENOL) 500 MG tablet 1,000 mg every 8 (eight) hours as needed for moderate pain.    alum & mag hydroxide-simeth (MAALOX PLUS) 400-400-40 MG/5ML suspension Take 15 mLs by mouth as needed for indigestion.    aspirin EC 81 MG tablet Take 1 tablet by mouth daily.   diphenhydrAMINE (BENADRYL) 25 mg capsule Take 25 mg by mouth at bedtime as needed for sleep.   donepezil (ARICEPT) 5 MG tablet Take 5 mg by mouth at bedtime.   hydrochlorothiazide (HYDRODIURIL) 25 MG tablet Take 25 mg by mouth daily.   isosorbide mononitrate (IMDUR) 60 MG 24 hr tablet Take 60 mg by  mouth daily.   levothyroxine (SYNTHROID) 200 MCG tablet Take 200 mcg by mouth daily before breakfast.    naproxen sodium (ALEVE) 220 MG tablet Take 220 mg by mouth 2 (two) times daily as needed (pain).   nitroGLYCERIN (NITROSTAT) 0.4 MG SL tablet Place 0.4 mg under the tongue every 5 (five) minutes as needed for chest pain.    ondansetron (ZOFRAN ODT) 4 MG disintegrating tablet Take 1 tablet (4 mg total) by mouth every 8 (eight) hours as needed for nausea or vomiting.   polyethylene glycol powder (GLYCOLAX/MIRALAX) 17 GM/SCOOP powder Take 17 g by mouth daily. Can increase to up to 51 grams daily, goal 1-2 soft formed bowel movements daily   potassium chloride (K-DUR,KLOR-CON) 10 MEQ tablet Take 10 mEq by mouth 2 (two) times daily.    senna (SENOKOT) 8.6 MG TABS tablet Take 1 tablet (8.6 mg total) by mouth daily.   sertraline (ZOLOFT) 100 MG tablet Take 100 mg by mouth daily.    No facility-administered encounter medications on file as of 06/05/2021.    Surgical History: Past Surgical History:  Procedure Laterality Date   ABDOMINAL HYSTERECTOMY     APPENDECTOMY     CATARACT EXTRACTION W/PHACO Left 03/09/2020   Procedure: CATARACT EXTRACTION PHACO AND INTRAOCULAR LENS PLACEMENT (IOC) LEFT;  Surgeon: Lockie Mola, MD;  Location: [facility] ORS;  Service: Ophthalmology;  Laterality: Left;  cde 9.56 us1:32.8 ap10.3%   EYE SURGERY Right    cataract extraction    Medical History: Past Medical History:  Diagnosis Date   Anxiety  Arthritis    Depression    Dyspnea    with exertion   Headache    migraines   History of myocarditis    Hyperlipemia    Hypertension    Hypocholesterolemia    Hypothyroidism    Myocardial infarction (HCC) 2010   Sleep apnea    uses a cpap    Family History: Family History  Problem Relation Age of Onset   Bladder Cancer Neg Hx    Kidney cancer Neg Hx    Breast cancer Neg Hx     Social History: Social History   Socioeconomic History   Marital  status: Widowed    Spouse name: Not on file   Number of children: Not on file   Years of education: Not on file   Highest education level: Not on file  Occupational History   Not on file  Tobacco Use   Smoking status: Never   Smokeless tobacco: Never  Vaping Use   Vaping Use: Never used  Substance and Sexual Activity   Alcohol use: No   Drug use: No   Sexual activity: Not Currently  Other Topics Concern   Not on file  Social History Narrative   Not on file   Social Determinants of Health   Financial Resource Strain: Not on file  Food Insecurity: Not on file  Transportation Needs: Not on file  Physical Activity: Not on file  Stress: Not on file  Social Connections: Not on file  Intimate Partner Violence: Not on file    Vital Signs: Blood pressure 138/68, pulse 77, temperature 98.4 F (36.9 C), resp. rate 16, height 5\' 3"  (1.6 m), SpO2 98 %.  Examination: General Appearance: The patient is well-developed, well-nourished, and in no distress. Skin: Gross inspection of skin unremarkable. Head: normocephalic, no gross deformities. Eyes: no gross deformities noted. ENT: ears appear grossly normal no exudates. Neck: Supple. No thyromegaly. No LAD. Respiratory: no rhonchi noted. Cardiovascular: Normal S1 and S2 without murmur or rub. Extremities: No cyanosis. pulses are equal. Neurologic: Alert and oriented. No involuntary movements.  LABS: No results found for this or any previous visit (from the past 2160 hour(s)).  Radiology: DG Abd 2 Views  Result Date: 02/26/2021 CLINICAL DATA:  Question impaction or obstruction EXAM: ABDOMEN - 2 VIEW COMPARISON:  CT 08/29/2017 FINDINGS: No free air beneath the diaphragm. Nonobstructed gas pattern with moderate stool in the rectum. No radiopaque calculi. Advanced degenerative changes of both hips. IMPRESSION: Nonobstructed gas pattern Electronically Signed   By: 08/31/2017 M.D.   On: 02/26/2021 18:33    No results found.  No  results found.    Assessment and Plan: Patient Active Problem List   Diagnosis Date Noted   Constipation 02/26/2021   Hypothyroidism, unspecified 12/13/2020   Chronic obstructive pulmonary disease (HCC) 09/02/2020   Pain due to onychomycosis of toenails of both feet 02/15/2019   Urge incontinence of urine 07/03/2017   Coronary arteriosclerosis in native artery 05/29/2014   CAD (coronary artery disease), native coronary artery 05/29/2014   Hyperlipidemia 05/29/2014   Hypertension 05/29/2014   Hyperlipidemia, unspecified 05/29/2014   Pain in extremity 06/21/2013   Onychomycosis due to dermatophyte 06/21/2013    1. OSA on CPAP Machine is nonfunctional.  Needs a replacement to be done.  We will write an order to get the replacement machine if the insurance has no injections.  2. CPAP use counseling CPAP Counseling: had a lengthy discussion with the patient regarding the importance of PAP therapy  in management of the sleep apnea. Patient appears to understand the risk factor reduction and also understands the risks associated with untreated sleep apnea. Patient will try to make a good faith effort to remain compliant with therapy. Also instructed the patient on proper cleaning of the device including the water must be changed daily if possible and use of distilled water is preferred. Patient understands that the machine should be regularly cleaned with appropriate recommended cleaning solutions that do not damage the PAP machine for example given white vinegar and water rinses. Other methods such as ozone treatment may not be as good as these simple methods to achieve cleaning.   3. Morbid obesity (HCC) Obesity Counseling: Had a lengthy discussion regarding patients BMI and weight issues. Patient was instructed on portion control as well as increased activity. Also discussed caloric restrictions with trying to maintain intake less than 2000 Kcal. Discussions were made in accordance with the  5As of weight management. Simple actions such as not eating late and if able to, taking a walk is suggested.   4. Obstructive chronic bronchitis without exacerbation (HCC) Severe disease patient should use as needed inhalers.  She is actually been under pretty good control and has not required any inhalers.  General Counseling: I have discussed the findings of the evaluation and examination with [place].  I have also discussed any further diagnostic evaluation thatmay be needed or ordered today. Riannah verbalizes understanding of the findings of todays visit. We also reviewed her medications today and discussed drug interactions and side effects including but not limited excessive drowsiness and altered mental states. We also discussed that there is always a risk not just to her but also people around her. she has been encouraged to call the office with any questions or concerns that should arise related to todays visit.  No orders of the defined types were placed in this encounter.    Time spent: 53  I have personally obtained a history, examined the patient, evaluated laboratory and imaging results, formulated the assessment and plan and placed orders.    Yevonne Pax, MD [facility] Pulmonary and Critical Care Sleep medicine

## 2021-06-05 NOTE — Patient Instructions (Signed)
Sleep Apnea Sleep apnea affects breathing during sleep. It causes breathing to stop for 10 seconds or more, or to become shallow. People with sleep apnea usually snoreloudly. It can also increase the risk of: Heart attack. Stroke. Being very overweight (obese). Diabetes. Heart failure. Irregular heartbeat. High blood pressure. The goal of treatment is to help you breathe normally again. What are the causes?  The most common cause of this condition is a collapsed or blocked airway. There are three kinds of sleep apnea: Obstructive sleep apnea. This is caused by a blocked or collapsed airway. Central sleep apnea. This happens when the brain does not send the right signals to the muscles that control breathing. Mixed sleep apnea. This is a combination of obstructive and central sleep apnea. What increases the risk? Being overweight. Smoking. Having a small airway. Being older. Being female. Drinking alcohol. Taking medicines to calm yourself (sedatives or tranquilizers). Having family members with the condition. Having a tongue or tonsils that are larger than normal. What are the signs or symptoms? Trouble staying asleep. Loud snoring. Headaches in the morning. Waking up gasping. Dry mouth or sore throat in the morning. Being sleepy or tired during the day. If you are sleepy or tired during the day, you may also: Not be able to focus your mind (concentrate). Forget things. Get angry a lot and have mood swings. Feel sad (depressed). Have changes in your personality. Have less interest in sex, if you are female. Be unable to have an erection, if you are female. How is this treated?  Sleeping on your side. Using a medicine to get rid of mucus in your nose (decongestant). Avoiding the use of alcohol, medicines to help you relax, or certain pain medicines (narcotics). Losing weight, if needed. Changing your diet. Quitting smoking. Using a machine to open your airway while you  sleep, such as: An oral appliance. This is a mouthpiece that shifts your lower jaw forward. A CPAP device. This device blows air through a mask when you breathe out (exhale). An EPAP device. This has valves that you put in each nostril. A BPAP device. This device blows air through a mask when you breathe in (inhale) and breathe out. Having surgery if other treatments do not work. Follow these instructions at home: Lifestyle Make changes that your doctor recommends. Eat a healthy diet. Lose weight if needed. Avoid alcohol, medicines to help you relax, and some pain medicines. Do not smoke or use any products that contain nicotine or tobacco. If you need help quitting, ask your doctor. General instructions Take over-the-counter and prescription medicines only as told by your doctor. If you were given a machine to use while you sleep, use it only as told by your doctor. If you are having surgery, make sure to tell your doctor you have sleep apnea. You may need to bring your device with you. Keep all follow-up visits. Contact a doctor if: The machine that you were given to use during sleep bothers you or does not seem to be working. You do not get better. You get worse. Get help right away if: Your chest hurts. You have trouble breathing in enough air. You have an uncomfortable feeling in your back, arms, or stomach. You have trouble talking. One side of your body feels weak. A part of your face is hanging down. These symptoms may be an emergency. Get help right away. Call your local emergency services (911 in the [place] Do not wait to see if the symptoms   will go away. Do not drive yourself to the hospital. Summary This condition affects breathing during sleep. The most common cause is a collapsed or blocked airway. The goal of treatment is to help you breathe normally while you sleep. This information is not intended to replace advice given to you by your health care provider. Make  sure you discuss any questions you have with your healthcare provider. Document Revised: 07/28/2020 Document Reviewed: 07/28/2020 Elsevier Patient Education  2022 Elsevier Inc.  

## 2021-07-09 ENCOUNTER — Telehealth: Payer: Self-pay

## 2021-07-09 NOTE — Telephone Encounter (Signed)
Patients order for a new CPAP machine was sent over to [facility]. Patient qualifies for a new machine as hers was sent out in 2012. Notified the patients daughter advised her to call us back if she doesn't hear back in 2 weeks from [facility].

## 2021-09-11 ENCOUNTER — Ambulatory Visit (INDEPENDENT_AMBULATORY_CARE_PROVIDER_SITE_OTHER): Payer: Commercial Managed Care - HMO | Admitting: Internal Medicine

## 2021-09-11 ENCOUNTER — Encounter (INDEPENDENT_AMBULATORY_CARE_PROVIDER_SITE_OTHER): Payer: Self-pay

## 2021-09-11 ENCOUNTER — Telehealth: Payer: Self-pay

## 2021-09-11 ENCOUNTER — Other Ambulatory Visit: Payer: Self-pay

## 2021-09-11 ENCOUNTER — Encounter: Payer: Self-pay | Admitting: Internal Medicine

## 2021-09-11 VITALS — BP 158/64 | HR 53 | Temp 98.0°F | Resp 16 | Ht 63.0 in

## 2021-09-11 DIAGNOSIS — G4733 Obstructive sleep apnea (adult) (pediatric): Secondary | ICD-10-CM

## 2021-09-11 DIAGNOSIS — Z9989 Dependence on other enabling machines and devices: Secondary | ICD-10-CM

## 2021-09-11 DIAGNOSIS — Z7189 Other specified counseling: Secondary | ICD-10-CM

## 2021-09-11 NOTE — Telephone Encounter (Signed)
Lmom to kelly about  cpap

## 2021-09-11 NOTE — Progress Notes (Signed)
[facility] [address] [place], [place] [id number]  Pulmonary Sleep Medicine   Office Visit Note  Patient Name: Danielle [name] DOB: 11/21/1928 MRN [id number]  Date of Service: 09/11/2021  Complaints/HPI: OSA she has been not using the CPAP device because it is not functioning. She was here back in October and we ordered a new device. [facility] but she states that she has not received any calls.  I am not sure that time she is in the timeframe for qualifying for new machine so we will have to check with her DME provider to make certain.  Other than that she has been doing well blood pressure was slightly elevated today.  ROS  General: (-) fever, (-) chills, (-) night sweats, (-) weakness Skin: (-) rashes, (-) itching,. Eyes: (-) visual changes, (-) redness, (-) itching. Nose and Sinuses: (-) nasal stuffiness or itchiness, (-) postnasal drip, (-) nosebleeds, (-) sinus trouble. Mouth and Throat: (-) sore throat, (-) hoarseness. Neck: (-) swollen glands, (-) enlarged thyroid, (-) neck pain. Respiratory: - cough, (-) bloody sputum, - shortness of breath, - wheezing. Cardiovascular: - ankle swelling, (-) chest pain. Lymphatic: (-) lymph node enlargement. Neurologic: (-) numbness, (-) tingling. Psychiatric: (-) anxiety, (-) depression   Current Medication: Outpatient Encounter Medications as of 09/11/2021  Medication Sig   acetaminophen (TYLENOL) 500 MG tablet 1,000 mg every 8 (eight) hours as needed for moderate pain.    alum & mag hydroxide-simeth (MAALOX PLUS) 400-400-40 MG/5ML suspension Take 15 mLs by mouth as needed for indigestion.    aspirin EC 81 MG tablet Take 1 tablet by mouth daily.   diphenhydrAMINE (BENADRYL) 25 mg capsule Take 25 mg by mouth at bedtime as needed for sleep.   donepezil (ARICEPT) 5 MG tablet Take 5 mg by mouth at bedtime.   hydrochlorothiazide (HYDRODIURIL) 25 MG tablet Take 25 mg by mouth daily.   isosorbide mononitrate (IMDUR) 60  MG 24 hr tablet Take 60 mg by mouth daily.   levothyroxine (SYNTHROID) 200 MCG tablet Take 200 mcg by mouth daily before breakfast.    naproxen sodium (ALEVE) 220 MG tablet Take 220 mg by mouth 2 (two) times daily as needed (pain).   nitroGLYCERIN (NITROSTAT) 0.4 MG SL tablet Place 0.4 mg under the tongue every 5 (five) minutes as needed for chest pain.    ondansetron (ZOFRAN ODT) 4 MG disintegrating tablet Take 1 tablet (4 mg total) by mouth every 8 (eight) hours as needed for nausea or vomiting.   polyethylene glycol powder (GLYCOLAX/MIRALAX) 17 GM/SCOOP powder Take 17 g by mouth daily. Can increase to up to 51 grams daily, goal 1-2 soft formed bowel movements daily   potassium chloride (K-DUR,KLOR-CON) 10 MEQ tablet Take 10 mEq by mouth 2 (two) times daily.    senna (SENOKOT) 8.6 MG TABS tablet Take 1 tablet (8.6 mg total) by mouth daily.   sertraline (ZOLOFT) 100 MG tablet Take 100 mg by mouth daily.    No facility-administered encounter medications on file as of 09/11/2021.    Surgical History: Past Surgical History:  Procedure Laterality Date   ABDOMINAL HYSTERECTOMY     APPENDECTOMY     CATARACT EXTRACTION W/PHACO Left 03/09/2020   Procedure: CATARACT EXTRACTION PHACO AND INTRAOCULAR LENS PLACEMENT (IOC) LEFT;  Surgeon: Lockie MolaBrasington, Chadwick, MD;  Location: [facility] ORS;  Service: Ophthalmology;  Laterality: Left;  cde 9.56 us1:32.8 ap10.3%   EYE SURGERY Right    cataract extraction    Medical History: Past Medical History:  Diagnosis Date   Anxiety    Arthritis    Depression    Dyspnea    with exertion   Headache    migraines   History of myocarditis    Hyperlipemia    Hypertension    Hypocholesterolemia    Hypothyroidism    Myocardial infarction (HCC) 2010   Sleep apnea    uses a cpap    Family History: Family History  Problem Relation Age of Onset   Bladder Cancer Neg Hx    Kidney cancer Neg Hx    Breast cancer Neg Hx     Social History: Social History    Socioeconomic History   Marital status: Widowed    Spouse name: Not on file   Number of children: Not on file   Years of education: Not on file   Highest education level: Not on file  Occupational History   Not on file  Tobacco Use   Smoking status: Never   Smokeless tobacco: Never  Vaping Use   Vaping Use: Never used  Substance and Sexual Activity   Alcohol use: No   Drug use: No   Sexual activity: Not Currently  Other Topics Concern   Not on file  Social History Narrative   Not on file   Social Determinants of Health   Financial Resource Strain: Not on file  Food Insecurity: Not on file  Transportation Needs: Not on file  Physical Activity: Not on file  Stress: Not on file  Social Connections: Not on file  Intimate Partner Violence: Not on file    Vital Signs: Blood pressure (!) 158/64, pulse (!) 53, temperature 98 F (36.7 C), resp. rate 16, height 5\' 3"  (1.6 m), SpO2 100 %.  Examination: General Appearance: The patient is well-developed, well-nourished, and in no distress. Skin: Gross inspection of skin unremarkable. Head: normocephalic, no gross deformities. Eyes: no gross deformities noted. ENT: ears appear grossly normal no exudates. Neck: Supple. No thyromegaly. No LAD. Respiratory: no rhonchi noted. Cardiovascular: Normal S1 and S2 without murmur or rub. Extremities: No cyanosis. pulses are equal. Neurologic: Alert and oriented. No involuntary movements.  LABS: No results found for this or any previous visit (from the past 2160 hour(s)).  Radiology: DG Abd 2 Views  Result Date: 02/26/2021 CLINICAL DATA:  Question impaction or obstruction EXAM: ABDOMEN - 2 VIEW COMPARISON:  CT 08/29/2017 FINDINGS: No free air beneath the diaphragm. Nonobstructed gas pattern with moderate stool in the rectum. No radiopaque calculi. Advanced degenerative changes of both hips. IMPRESSION: Nonobstructed gas pattern Electronically Signed   By: 08/31/2017 M.D.   On:  02/26/2021 18:33    No results found.  No results found.    Assessment and Plan: Patient Active Problem List   Diagnosis Date Noted   Constipation 02/26/2021   Hypothyroidism, unspecified 12/13/2020   Chronic obstructive pulmonary disease (HCC) 09/02/2020   Pain due to onychomycosis of toenails of both feet 02/15/2019   Urge incontinence of urine 07/03/2017   Coronary arteriosclerosis in native artery 05/29/2014   CAD (coronary artery disease), native coronary artery 05/29/2014   Hyperlipidemia 05/29/2014   Hypertension 05/29/2014   Hyperlipidemia, unspecified 05/29/2014   Pain in extremity 06/21/2013   Onychomycosis due to dermatophyte 06/21/2013    1. OSA on CPAP Recommend following up with her DME provider for the CPAP device update orders were written last time however she may not be yet eligible for a new device  2. CPAP use counseling CPAP Counseling: had a  lengthy discussion with the patient regarding the importance of PAP therapy in management of the sleep apnea. Patient appears to understand the risk factor reduction and also understands the risks associated with untreated sleep apnea. Patient will try to make a good faith effort to remain compliant with therapy. Also instructed the patient on proper cleaning of the device including the water must be changed daily if possible and use of distilled water is preferred. Patient understands that the machine should be regularly cleaned with appropriate recommended cleaning solutions that do not damage the PAP machine for example given white vinegar and water rinses. Other methods such as ozone treatment may not be as good as these simple methods to achieve cleaning.   3. Morbid obesity (HCC) Obesity Counseling: Had a lengthy discussion regarding patients BMI and weight issues. Patient was instructed on portion control as well as increased activity. Also discussed caloric restrictions with trying to maintain intake less than 2000  Kcal. Discussions were made in accordance with the 5As of weight management. Simple actions such as not eating late and if able to, taking a walk is suggested.    General Counseling: I have discussed the findings of the evaluation and examination with Amila.  I have also discussed any further diagnostic evaluation thatmay be needed or ordered today. Sharonica verbalizes understanding of the findings of todays visit. We also reviewed her medications today and discussed drug interactions and side effects including but not limited excessive drowsiness and altered mental states. We also discussed that there is always a risk not just to her but also people around her. she has been encouraged to call the office with any questions or concerns that should arise related to todays visit.  No orders of the defined types were placed in this encounter.    Time spent: 27  I have personally obtained a history, examined the patient, evaluated laboratory and imaging results, formulated the assessment and plan and placed orders.    Yevonne Pax, MD [facility] Pulmonary and Critical Care Sleep medicine

## 2021-09-11 NOTE — Telephone Encounter (Signed)
Spoke with beth from [facility] about pt cpap order they said that in 2019 she not eligible until 06/2022 and with other company advised pt daughter that let us know which company they get cpap machine and we will call them go from there for new cpap

## 2021-09-12 ENCOUNTER — Telehealth: Payer: Self-pay

## 2021-09-12 NOTE — Telephone Encounter (Signed)
Spoke to pt and asked for her to have daughter to call me back in regards to the cpap

## 2021-09-13 ENCOUNTER — Telehealth: Payer: Self-pay

## 2021-09-13 NOTE — Telephone Encounter (Signed)
Christoper Allegra got back in touch with s and informed us that pt received her CPAP machine in 02/16/2018- the insurance will not cover a new machine at this time- i have included some repair companies she may reach out to so she can have her fixed.   [employer]- [phone]  [facility]- [phone]  [facility] [phone]   I gave pt's daughter Madelin Rear the above information to call for repairs on CPAP machine due to pt not qualifying yet for a new machine til 02/2023

## 2021-09-13 NOTE — Telephone Encounter (Signed)
Spoke to pt's daughter and informed her that pt wasn't eligible for new CPAP machine until 02/2023 and gave her companies with phone numbers to call for repairs on CPAP machine

## 2022-01-15 ENCOUNTER — Ambulatory Visit: Payer: Medicare Other | Admitting: Internal Medicine

## 2022-02-12 ENCOUNTER — Encounter: Payer: Self-pay | Admitting: Internal Medicine

## 2022-02-12 ENCOUNTER — Ambulatory Visit (INDEPENDENT_AMBULATORY_CARE_PROVIDER_SITE_OTHER): Payer: Medicare Other | Admitting: Internal Medicine

## 2022-02-12 VITALS — BP 155/61 | HR 71 | Temp 98.0°F | Resp 16 | Ht 63.0 in | Wt 197.0 lb

## 2022-02-12 DIAGNOSIS — G4733 Obstructive sleep apnea (adult) (pediatric): Secondary | ICD-10-CM | POA: Diagnosis not present

## 2022-02-12 DIAGNOSIS — J449 Chronic obstructive pulmonary disease, unspecified: Secondary | ICD-10-CM | POA: Diagnosis not present

## 2022-02-12 NOTE — Patient Instructions (Signed)

## 2022-02-12 NOTE — Progress Notes (Signed)
[facility] [address] [place], [place] [address]  Pulmonary Sleep Medicine   Office Visit Note  Patient Name: Danielle [name] DOB: September 19, 1928 MRN [id number]  Date of Service: 02/12/2022  Complaints/HPI: She is doing well overall. She is not using CPAP as she has not received it. Patient is not due until 10/23 for a new machine based on her studies.  When she gets to October she will get a new machine.  She is some had some issues since she has been off the machine overall however she denies having any cough congestion chest pain palp patient is no admissions to hospital.  ROS  General: (-) fever, (-) chills, (-) night sweats, (-) weakness Skin: (-) rashes, (-) itching,. Eyes: (-) visual changes, (-) redness, (-) itching. Nose and Sinuses: (-) nasal stuffiness or itchiness, (-) postnasal drip, (-) nosebleeds, (-) sinus trouble. Mouth and Throat: (-) sore throat, (-) hoarseness. Neck: (-) swollen glands, (-) enlarged thyroid, (-) neck pain. Respiratory: - cough, (-) bloody sputum, - shortness of breath, - wheezing. Cardiovascular: - ankle swelling, (-) chest pain. Lymphatic: (-) lymph node enlargement. Neurologic: (-) numbness, (-) tingling. Psychiatric: (-) anxiety, (-) depression   Current Medication: Outpatient Encounter Medications as of 02/12/2022  Medication Sig   acetaminophen (TYLENOL) 500 MG tablet 1,000 mg every 8 (eight) hours as needed for moderate pain.    alum & mag hydroxide-simeth (MAALOX PLUS) 400-400-40 MG/5ML suspension Take 15 mLs by mouth as needed for indigestion.    aspirin EC 81 MG tablet Take 1 tablet by mouth daily.   diphenhydrAMINE (BENADRYL) 25 mg capsule Take 25 mg by mouth at bedtime as needed for sleep.   donepezil (ARICEPT) 5 MG tablet Take 5 mg by mouth at bedtime.   hydrochlorothiazide (HYDRODIURIL) 25 MG tablet Take 25 mg by mouth daily.   isosorbide mononitrate (IMDUR) 60 MG 24 hr tablet Take 60 mg by mouth daily.    levothyroxine (SYNTHROID) 200 MCG tablet Take 200 mcg by mouth daily before breakfast.    naproxen sodium (ALEVE) 220 MG tablet Take 220 mg by mouth 2 (two) times daily as needed (pain).   nitroGLYCERIN (NITROSTAT) 0.4 MG SL tablet Place 0.4 mg under the tongue every 5 (five) minutes as needed for chest pain.    ondansetron (ZOFRAN ODT) 4 MG disintegrating tablet Take 1 tablet (4 mg total) by mouth every 8 (eight) hours as needed for nausea or vomiting.   polyethylene glycol powder (GLYCOLAX/MIRALAX) 17 GM/SCOOP powder Take 17 g by mouth daily. Can increase to up to 51 grams daily, goal 1-2 soft formed bowel movements daily   potassium chloride (K-DUR,KLOR-CON) 10 MEQ tablet Take 10 mEq by mouth 2 (two) times daily.    senna (SENOKOT) 8.6 MG TABS tablet Take 1 tablet (8.6 mg total) by mouth daily.   sertraline (ZOLOFT) 100 MG tablet Take 100 mg by mouth daily.    No facility-administered encounter medications on file as of 02/12/2022.    Surgical History: Past Surgical History:  Procedure Laterality Date   ABDOMINAL HYSTERECTOMY     APPENDECTOMY     CATARACT EXTRACTION W/PHACO Left 03/09/2020   Procedure: CATARACT EXTRACTION PHACO AND INTRAOCULAR LENS PLACEMENT (IOC) LEFT;  Surgeon: Lockie Mola, MD;  Location: [facility] ORS;  Service: Ophthalmology;  Laterality: Left;  cde 9.56 us1:32.8 ap10.3%   EYE SURGERY Right    cataract extraction    Medical History: Past Medical History:  Diagnosis Date   Anxiety    Arthritis  Depression    Dyspnea    with exertion   Headache    migraines   History of myocarditis    Hyperlipemia    Hypertension    Hypocholesterolemia    Hypothyroidism    Myocardial infarction (HCC) 2010   Sleep apnea    uses a cpap    Family History: Family History  Problem Relation Age of Onset   Bladder Cancer Neg Hx    Kidney cancer Neg Hx    Breast cancer Neg Hx     Social History: Social History   Socioeconomic History   Marital status: Widowed     Spouse name: Not on file   Number of children: Not on file   Years of education: Not on file   Highest education level: Not on file  Occupational History   Not on file  Tobacco Use   Smoking status: Never   Smokeless tobacco: Never  Vaping Use   Vaping Use: Never used  Substance and Sexual Activity   Alcohol use: No   Drug use: No   Sexual activity: Not Currently  Other Topics Concern   Not on file  Social History Narrative   Not on file   Social Determinants of Health   Financial Resource Strain: Not on file  Food Insecurity: Not on file  Transportation Needs: Not on file  Physical Activity: Not on file  Stress: Not on file  Social Connections: Not on file  Intimate Partner Violence: Not on file    Vital Signs: Blood pressure (!) 155/61, pulse 71, temperature 98 F (36.7 C), resp. rate 16, height 5\' 3"  (1.6 m), weight 197 lb (89.4 kg), SpO2 99 %.  Examination: General Appearance: The patient is well-developed, well-nourished, and in no distress. Skin: Gross inspection of skin unremarkable. Head: normocephalic, no gross deformities. Eyes: no gross deformities noted. ENT: ears appear grossly normal no exudates. Neck: Supple. No thyromegaly. No LAD. Respiratory: no rhonchi noted. Cardiovascular: Normal S1 and S2 without murmur or rub. Extremities: No cyanosis. pulses are equal. Neurologic: Alert and oriented. No involuntary movements.  LABS: No results found for this or any previous visit (from the past 2160 hour(s)).  Radiology: DG Abd 2 Views  Result Date: 02/26/2021 CLINICAL DATA:  Question impaction or obstruction EXAM: ABDOMEN - 2 VIEW COMPARISON:  CT 08/29/2017 FINDINGS: No free air beneath the diaphragm. Nonobstructed gas pattern with moderate stool in the rectum. No radiopaque calculi. Advanced degenerative changes of both hips. IMPRESSION: Nonobstructed gas pattern Electronically Signed   By: 08/31/2017 M.D.   On: 02/26/2021 18:33    No results  found.  No results found.    Assessment and Plan: Patient Active Problem List   Diagnosis Date Noted   Constipation 02/26/2021   Hypothyroidism, unspecified 12/13/2020   Chronic obstructive pulmonary disease (HCC) 09/02/2020   Pain due to onychomycosis of toenails of both feet 02/15/2019   Urge incontinence of urine 07/03/2017   Coronary arteriosclerosis in native artery 05/29/2014   CAD (coronary artery disease), native coronary artery 05/29/2014   Hyperlipidemia 05/29/2014   Hypertension 05/29/2014   Hyperlipidemia, unspecified 05/29/2014   Pain in extremity 06/21/2013   Onychomycosis due to dermatophyte 06/21/2013    1. Obstructive chronic bronchitis without exacerbation (HCC) Inhalers nebulizers as needed we will continue to follow along closely.  2. OSA (obstructive sleep apnea) The patient has been on PAP therapy and she is due for a new machine and we will continue to monitor and follow along  3. Obesity, morbid (HCC) Obesity Counseling: Had a lengthy discussion regarding patients BMI and weight issues. Patient was instructed on portion control as well as increased activity. Also discussed caloric restrictions with trying to maintain intake less than 2000 Kcal. Discussions were made in accordance with the 5As of weight management. Simple actions such as not eating late and if able to, taking a walk is suggested.    General Counseling: I have discussed the findings of the evaluation and examination with Britnie.  I have also discussed any further diagnostic evaluation thatmay be needed or ordered today. Nyeisha verbalizes understanding of the findings of todays visit. We also reviewed her medications today and discussed drug interactions and side effects including but not limited excessive drowsiness and altered mental states. We also discussed that there is always a risk not just to her but also people around her. she has been encouraged to call the office with any questions or  concerns that should arise related to todays visit.  No orders of the defined types were placed in this encounter.    Time spent: 5545  I have personally obtained a history, examined the patient, evaluated laboratory and imaging results, formulated the assessment and plan and placed orders.    Yevonne PaxSaadat A Chareese Sergent, MD [facility] Pulmonary and Critical Care Sleep medicine

## 2022-06-03 ENCOUNTER — Ambulatory Visit (INDEPENDENT_AMBULATORY_CARE_PROVIDER_SITE_OTHER): Payer: Medicare Other | Admitting: Dermatology

## 2022-06-03 DIAGNOSIS — L819 Disorder of pigmentation, unspecified: Secondary | ICD-10-CM

## 2022-06-03 DIAGNOSIS — L209 Atopic dermatitis, unspecified: Secondary | ICD-10-CM

## 2022-06-03 DIAGNOSIS — L508 Other urticaria: Secondary | ICD-10-CM

## 2022-06-03 DIAGNOSIS — Z79899 Other long term (current) drug therapy: Secondary | ICD-10-CM

## 2022-06-03 DIAGNOSIS — L509 Urticaria, unspecified: Secondary | ICD-10-CM

## 2022-06-03 MED ORDER — MOMETASONE FUROATE 0.1 % EX CREA: TOPICAL_CREAM | CUTANEOUS | 3 refills | Status: DC

## 2022-06-03 NOTE — Patient Instructions (Addendum)
Urticaria or hives is a pink to red patchy whelp- like rash of the skin that typically itches and it is the result of histamine release in the skin.   Hives may have multiple causes including stress, medications, infections, and systemic illness.  Sometimes there is a family history of chronic urticaria.   "Physical urticarias" may be caused by heat, sun, cold, vibration.   Insect bites can cause "papular urticaria". It is often difficult to find the cause of generalized hives.  Statistically, 70% of the time a cause of generalized hives is not found.  Sometimes hives can spontaneously resolve. Other times hives can persist and when it does, and no cause is found, and it has been at least 6 weeks since started, it is called "chronic idiopathic urticaria". Antihistamines are the mainstay for treatment.  In severe cases Xolair injections may be used.  Continue Certrizine / Zyrtec 10 mg tab  daily can increase dose when flared. Can use up to 4 tablets a day when flared.  If clear can go back to using once daily.  If not responding to 4 tablets call us.  D/c benadryl     For Eczema  Start mometasone cream - apply topically twice daily to affected areas of body for itchy rash use Monday thru Friday as needed for rash. If clear discontinue.  Topical steroids (such as triamcinolone, fluocinolone, fluocinonide, mometasone, clobetasol, halobetasol, betamethasone, hydrocortisone) can cause thinning and lightening of the skin if they are used for too long in the same area. Your physician has selected the right strength medicine for your problem and area affected on the body. Please use your medication only as directed by your physician to prevent side effects.        Due to recent changes in healthcare laws, you may see results of your pathology and/or laboratory studies on MyChart before the doctors have had a chance to review them. We understand that in some cases there may be results that are  confusing or concerning to you. Please understand that not all results are received at the same time and often the doctors may need to interpret multiple results in order to provide you with the best plan of care or course of treatment. Therefore, we ask that you please give [place] 2 business days to thoroughly review all your results before contacting the office for clarification. Should we see a critical lab result, you will be contacted sooner.   If You Need Anything After Your Visit  If you have any questions or concerns for your doctor, please call our main line at [phone] and press option 4 to reach your doctor's medical assistant. If no one answers, please leave a voicemail as directed and we will return your call as soon as possible. Messages left after 4 pm will be answered the following business day.   You may also send [place] a message via MyChart. We typically respond to MyChart messages within 1-2 business days.  For prescription refills, please ask your pharmacy to contact our office. Our fax number is [phone].  If you have an urgent issue when the clinic is closed that cannot wait until the next business day, you can page your doctor at the number below.    Please note that while we do our best to be available for urgent issues outside of office hours, we are not available 24/7.   If you have an urgent issue and are unable to reach [place], you may choose to seek  medical care at your doctor's office, retail clinic, urgent care center, or emergency room.  If you have a medical emergency, please immediately call 911 or go to the emergency department.  Pager Numbers  - Dr. Nehemiah Massed: [phone]  - Dr. Laurence Ferrari: [phone]  - Dr. Nicole Kindred: [phone]  In the event of inclement weather, please call our main line at [phone] for an update on the status of any delays or closures.  Dermatology Medication Tips: Please keep the boxes that topical medications come in in order to  help keep track of the instructions about where and how to use these. Pharmacies typically print the medication instructions only on the boxes and not directly on the medication tubes.   If your medication is too expensive, please contact our office at [phone] option 4 or send [place] a message through [place].   We are unable to tell what your co-pay for medications will be in advance as this is different depending on your insurance coverage. However, we may be able to find a substitute medication at lower cost or fill out paperwork to get insurance to cover a needed medication.   If a prior authorization is required to get your medication covered by your insurance company, please allow [place] 1-2 business days to complete this process.  Drug prices often vary depending on where the prescription is filled and some pharmacies may offer cheaper prices.  The website www.goodrx.com contains coupons for medications through different pharmacies. The prices here do not account for what the cost may be with help from insurance (it may be cheaper with your insurance), but the website can give you the price if you did not use any insurance.  - You can print the associated coupon and take it with your prescription to the pharmacy.  - You may also stop by our office during regular business hours and pick up a GoodRx coupon card.  - If you need your prescription sent electronically to a different pharmacy, notify our office through [facility] or by phone at [phone] option 4.     Si Usted Necesita Algo Despus de Su Visita  Tambin puede enviarnos un mensaje a travs de Pharmacist, community. Por lo general respondemos a los mensajes de MyChart en el transcurso de 1 a 2 das hbiles.  Para renovar recetas, por favor pida a su farmacia que se ponga en contacto con nuestra oficina. Harland Dingwall de fax es [place] [phone].  Si tiene un asunto urgente cuando la clnica est cerrada y que no puede esperar hasta  el siguiente da hbil, puede llamar/localizar a su doctor(a) al nmero que aparece a continuacin.   Por favor, tenga en cuenta que aunque hacemos todo lo posible para estar disponibles para asuntos urgentes fuera del horario de [place], no estamos disponibles las 24 horas del da, los 7 das de la [place].   Si tiene un problema urgente y no puede comunicarse con nosotros, puede optar por buscar atencin mdica  en el consultorio de su doctor(a), en una clnica privada, en un centro de atencin urgente o en una sala de emergencias.  Si tiene Engineering geologist, por favor llame inmediatamente al 911 o vaya a la sala de emergencias.  Nmeros de bper  - Dr. Nehemiah Massed: [phone]  - Dra. Moye: [phone]  - Dra. Nicole Kindred: [phone]  En caso de inclemencias del [place], por favor llame a Johnsie Kindred principal al [phone] para una actualizacin sobre el [place] de cualquier retraso o cierre.  Consejos para la  medicacin en dermatologa: Por favor, guarde las cajas en las que vienen los medicamentos de uso tpico para ayudarle a seguir las instrucciones sobre dnde y cmo usarlos. Las farmacias generalmente imprimen las instrucciones del medicamento slo en las cajas y no directamente en los tubos del [place].   Si su medicamento es muy caro, por favor, pngase en contacto con Zigmund Daniel llamando al [phone] y presione la opcin 4 o envenos un mensaje a travs de Pharmacist, community.   No podemos decirle cul ser su copago por los medicamentos por adelantado ya que esto es diferente dependiendo de la cobertura de su seguro. Sin embargo, es posible que podamos encontrar un medicamento sustituto a Electrical engineer un formulario para que el seguro cubra el medicamento que se considera necesario.   Si se requiere una autorizacin previa para que su compaa de seguros [place] su medicamento, por favor permtanos de 1 a 2 das hbiles para completar este proceso.  Los precios de los  medicamentos varan con frecuencia dependiendo del Environmental consultant de dnde se surte la receta y alguna farmacias pueden ofrecer precios ms baratos.  El sitio web www.goodrx.com tiene cupones para medicamentos de Airline pilot. Los precios aqu no tienen en cuenta lo que podra costar con la ayuda del seguro (puede ser ms barato con su seguro), pero el sitio web puede darle el precio si no utiliz Research scientist (physical sciences).  - Puede imprimir el cupn correspondiente y llevarlo con su receta a la farmacia.  - Tambin puede pasar por nuestra oficina durante el horario de atencin regular y Charity fundraiser una tarjeta de cupones de GoodRx.  - Si necesita que su receta se enve electrnicamente a una farmacia diferente, informe a nuestra oficina a travs de MyChart de [place] o por telfono llamando al [phone] y presione la opcin 4.

## 2022-06-03 NOTE — Progress Notes (Signed)
New Patient Visit  Subjective  Danielle [name] is a 88 y.o. female who presents for the following: New Patient (Initial Visit) (Here with daughter concerning itchy rash at arms b/l. Reports history of eczema and hives for several years. ).  The following portions of the chart were reviewed this encounter and updated as appropriate:   Tobacco  Allergies  Meds  Problems  Med Hx  Surg Hx  Fam Hx     Review of Systems:  No other skin or systemic complaints except as noted in HPI or Assessment and Plan.  Objective  Well appearing patient in no apparent distress; mood and affect are within normal limits.  A focused examination was performed including back, b/l shoulders and arms . Relevant physical exam findings are noted in the Assessment and Plan.  b/l upper arms and back Hypopigmentation at left shoulder and hyperpigmentation upper back  body Clear at exam    Assessment & Plan  Atopic dermatitis, With hyperpigmentation b/l upper arms and back  Atopic dermatitis (eczema) is a chronic, relapsing, pruritic condition that can significantly affect quality of life. It is often associated with allergic rhinitis and/or asthma and can require treatment with topical medications, phototherapy, or in severe cases biologic injectable medication (Dupixent; Adbry) or Oral JAK inhibitors.  Start mometasone 0.1 % cream - apply topically bid to aa of rash M-F when flared. D/c when rash is clear. 45 g 3 rfs.   Can use moisturizer on weekends to affected areas.   Topical steroids (such as triamcinolone, fluocinolone, fluocinonide, mometasone, clobetasol, halobetasol, betamethasone, hydrocortisone) can cause thinning and lightening of the skin if they are used for too long in the same area. Your physician has selected the right strength medicine for your problem and area affected on the body. Please use your medication only as directed by your physician to prevent side effects.   mometasone  (ELOCON) 0.1 % cream - b/l upper arms and back Apply topically to aa of rash at body bid m - f weekly use only when flared. D/c when rash is clear  Urticaria Episodic, but chronic recurrent body Currently clear  Urticaria or hives is a pink to red patchy whelp- like rash of the skin that typically itches and it is the result of histamine release in the skin.   Hives may have multiple causes including stress, medications, infections, and systemic illness.  Sometimes there is a family history of chronic urticaria.   "Physical urticarias" may be caused by heat, sun, cold, vibration.   Insect bites can cause "papular urticaria". It is often difficult to find the cause of generalized hives.  Statistically, 70% of the time a cause of generalized hives is not found.  Sometimes hives can spontaneously resolve. Other times hives can persist and when it does, and no cause is found, and it has been at least 6 weeks since started, it is called "chronic idiopathic urticaria". Antihistamines are the mainstay for treatment.  In severe cases Xolair injections may be used.  Patient's daughter reports patient has had long history of hives  Clear at exam. Patient reports comes and goes.  D/c benadryl   Patient currently on certrizine 10 mg tablet by mouth daily. Discussed can increase to up to 4 tablets daily prn when flared. Advised patient to call if 4 tablets is not helping with flare.  May add Singulair. Or if needed, Xolair (and may discuss Dupixent)  Return if symptoms worsen or fail to improve.  I, Melissa  Redmond Pulling, CMA, am acting as scribe for Sarina Ser, MD. Documentation: I have reviewed the above documentation for accuracy and completeness, and I agree with the above.  Sarina Ser, MD

## 2022-06-04 ENCOUNTER — Encounter: Payer: Self-pay | Admitting: Dermatology

## 2022-09-01 IMAGING — CR DG CHEST 2V
2 series · 2 of 2 positions shown · non-contrast
Comparison: None.

CLINICAL DATA: Chest pain.

EXAM:
CHEST - 2 VIEW

[chest lat]
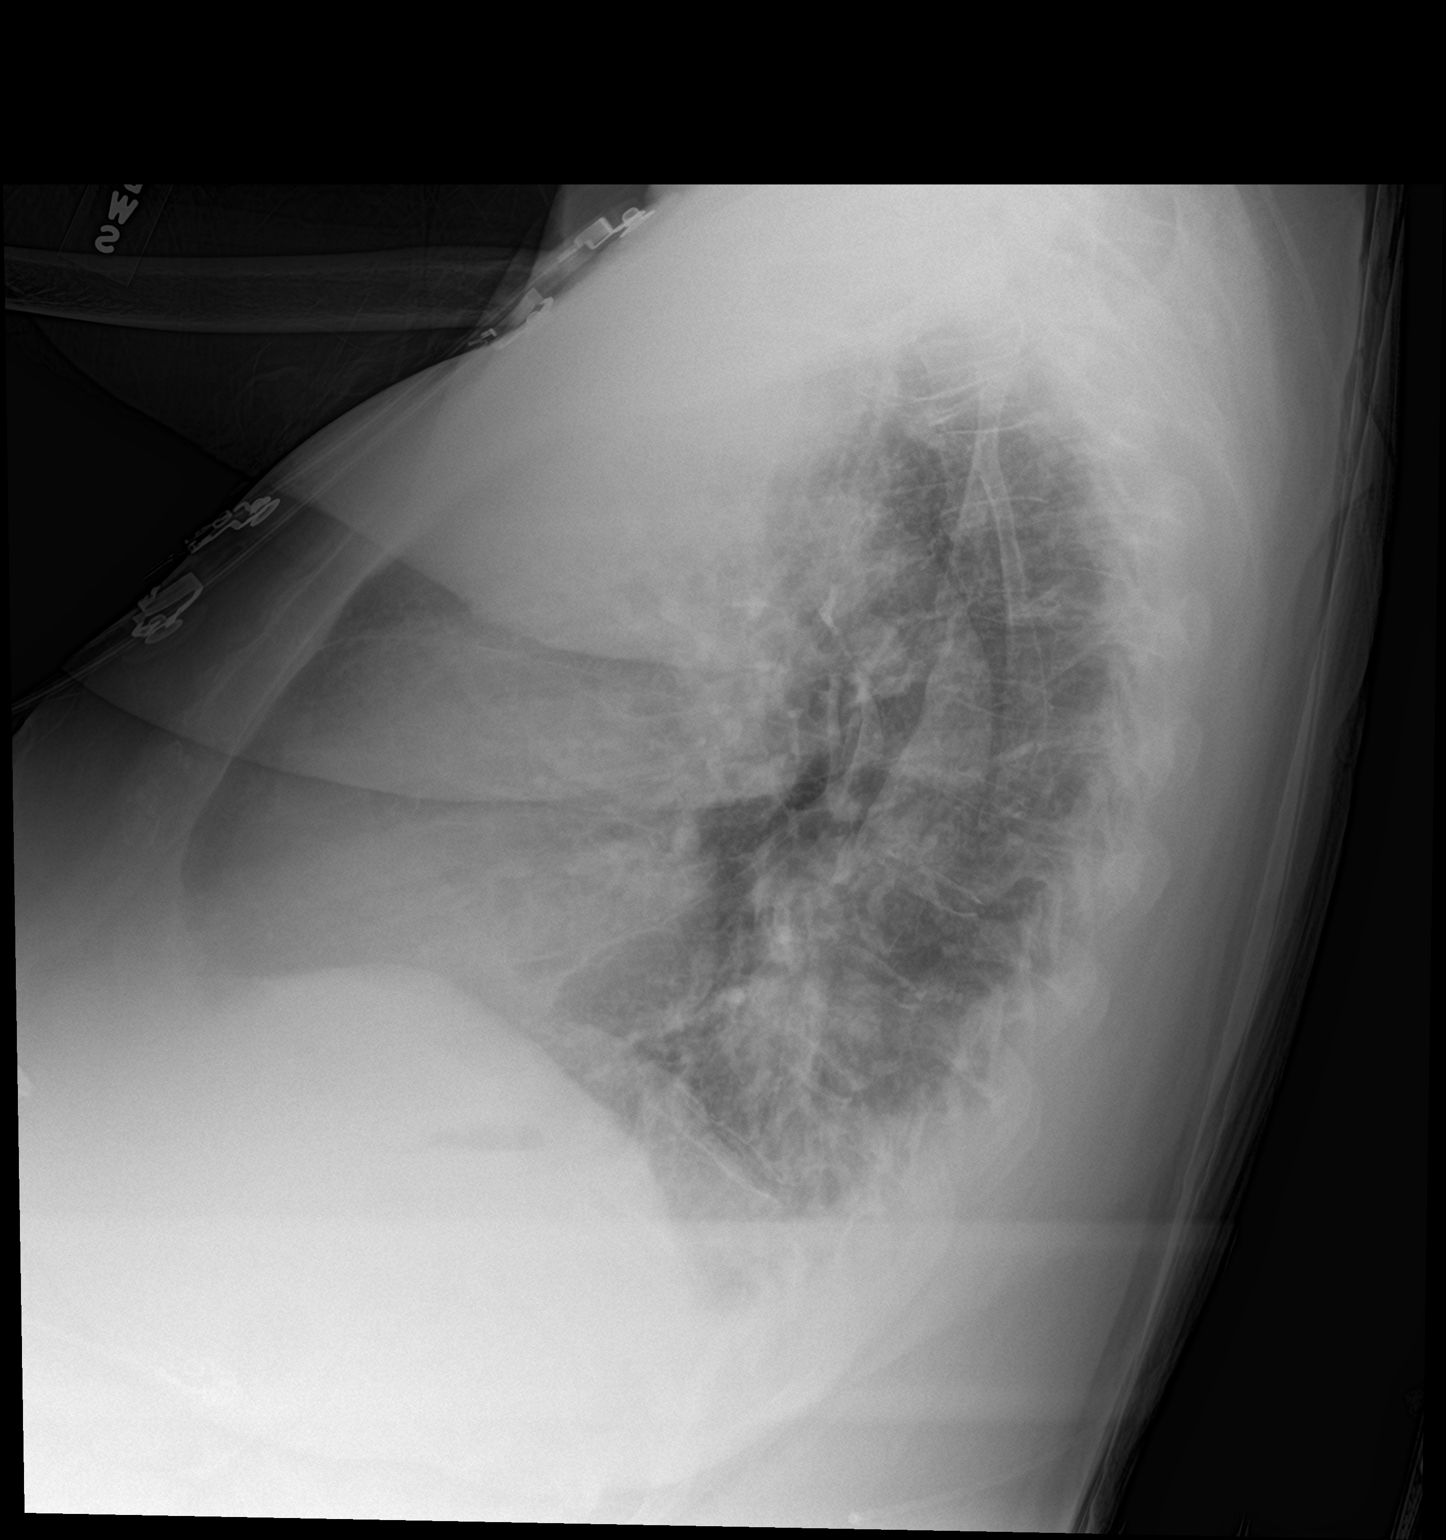

[chest ap]
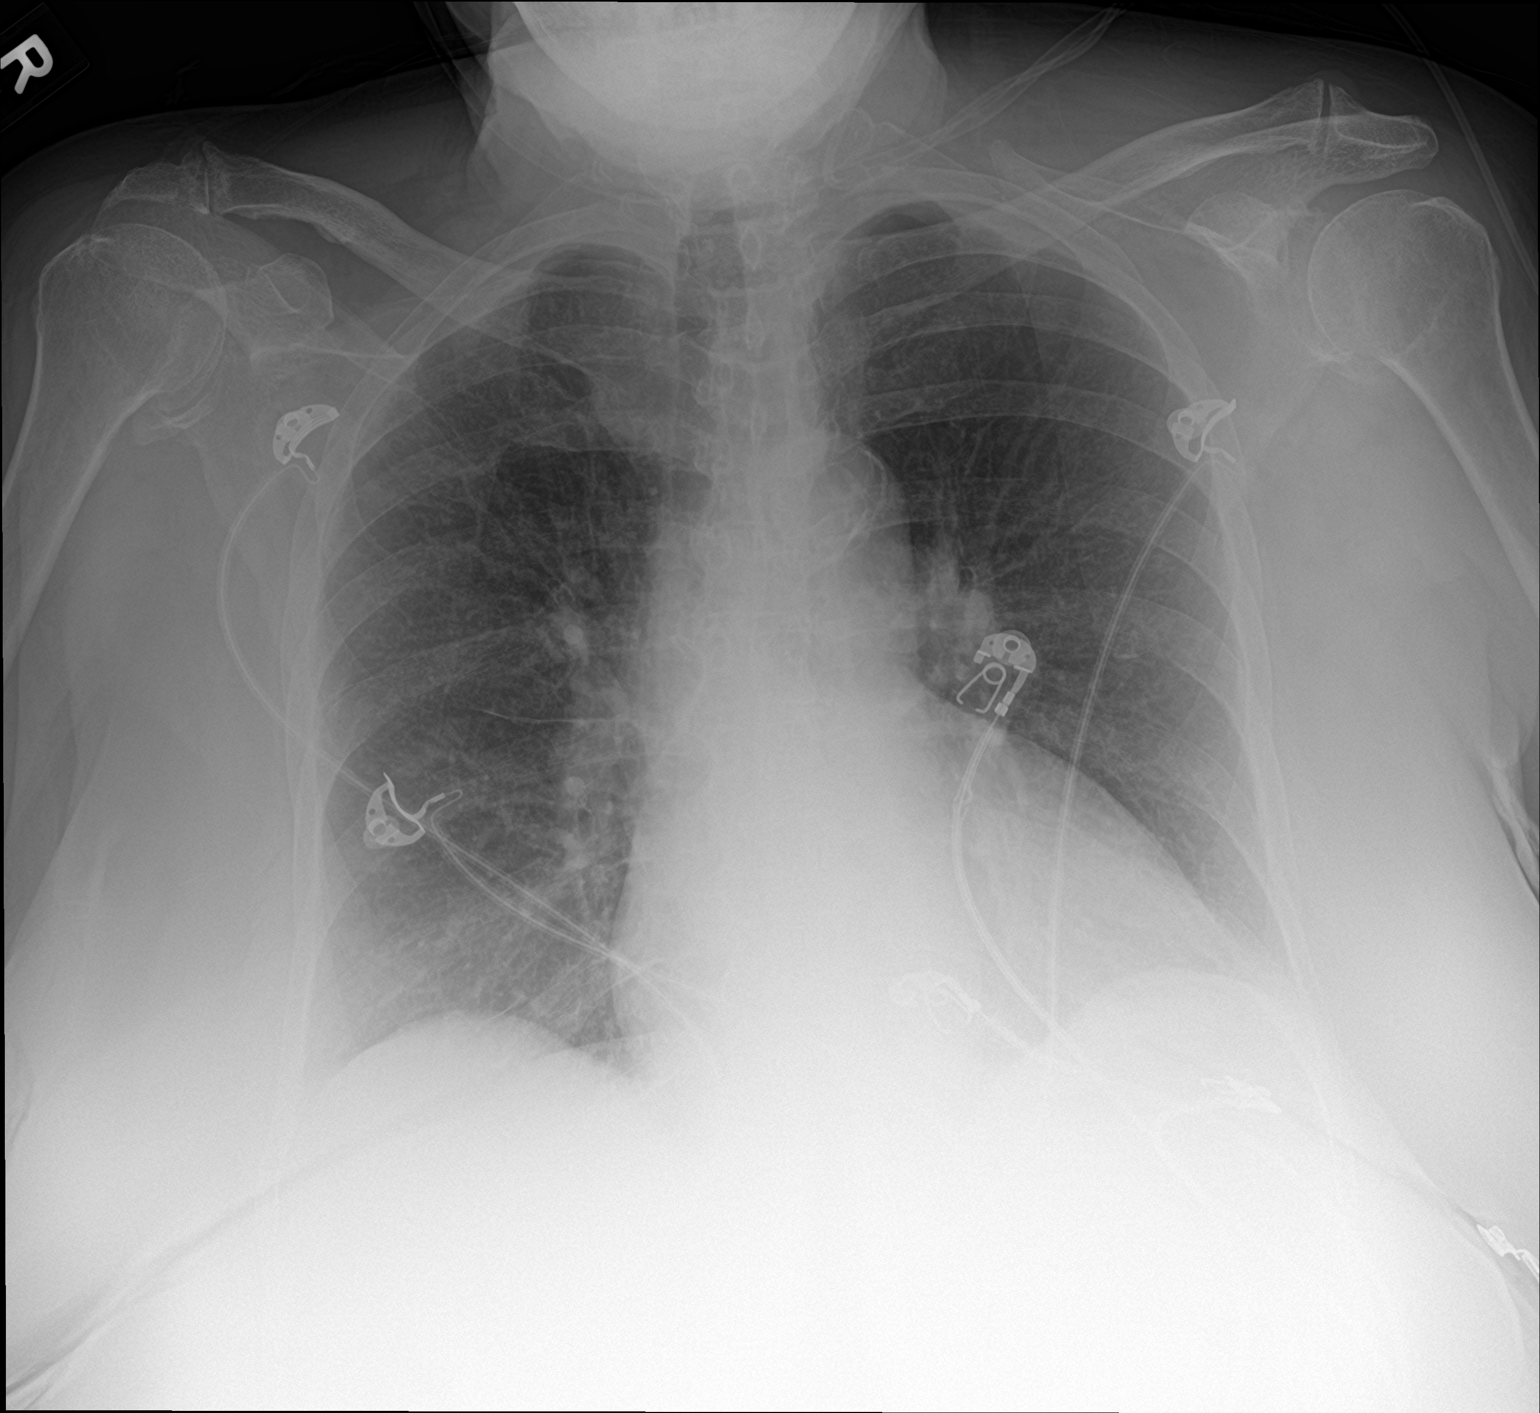

[2 of 2 positions shown; findings below may reference images not displayed]

FINDINGS: The heart size and mediastinal contours are within normal limits.
Both lungs are clear. The visualized skeletal structures are
unremarkable.
IMPRESSION: No acute cardiopulmonary process.

## 2022-09-07 IMAGING — CT CT HEAD W/O CM
4 series · 17 of 47 positions shown, 19 images · non-contrast
Comparison: Report dated 08/09/2012

CLINICAL DATA: Altered mental status.  Slowed speech.

EXAM:
CT HEAD WITHOUT CONTRAST
TECHNIQUE: Contiguous axial images were obtained from the base of the skull
through the vertex without intravenous contrast.

[Series 2: head bone · axial · 0.44mm/px · z∈[-145,-87]mm · 4 of 84 slices shown]
[im 9/84  bone]
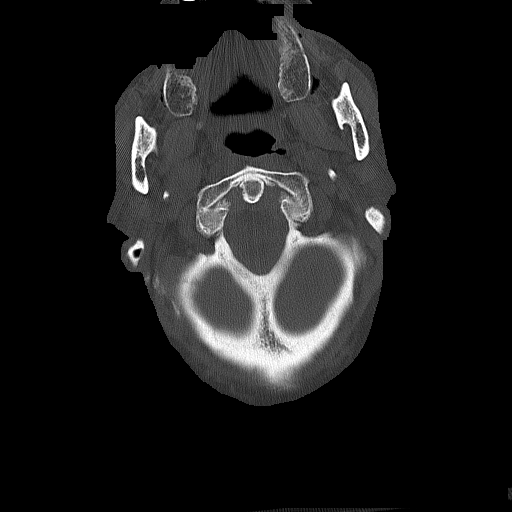
[im 17/84  bone]
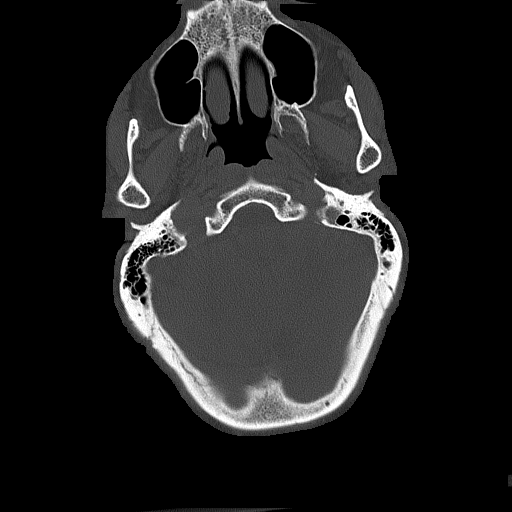
[im 25/84  bone]
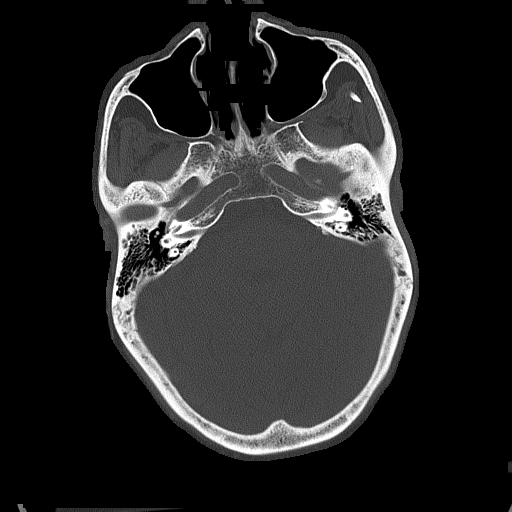
[im 38/84  bone]
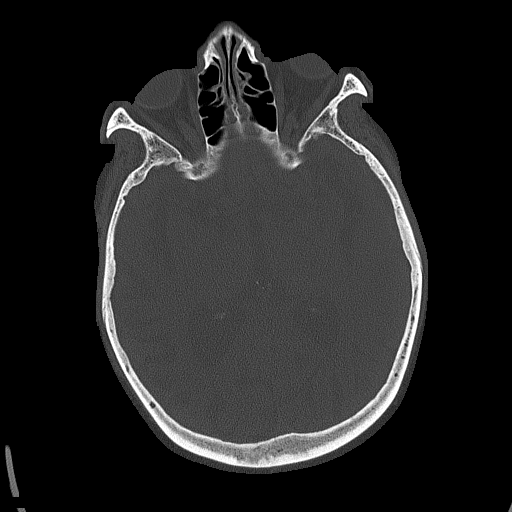

[Series 3: head wo · axial · 0.44mm/px · z∈[-141,-21]mm · 7 of 34 slices shown, 9 images]
[im 5/34  brain]
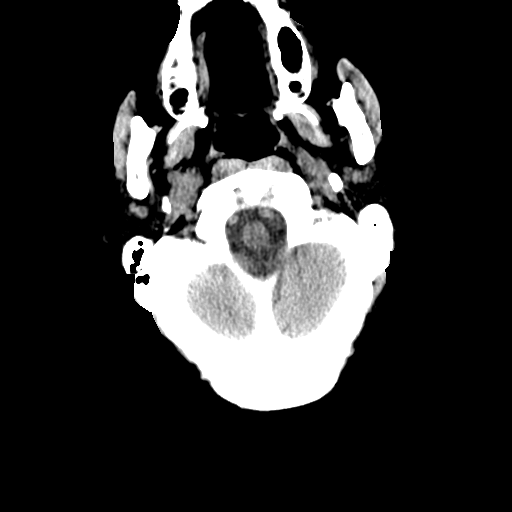
[im 5/34  bone]
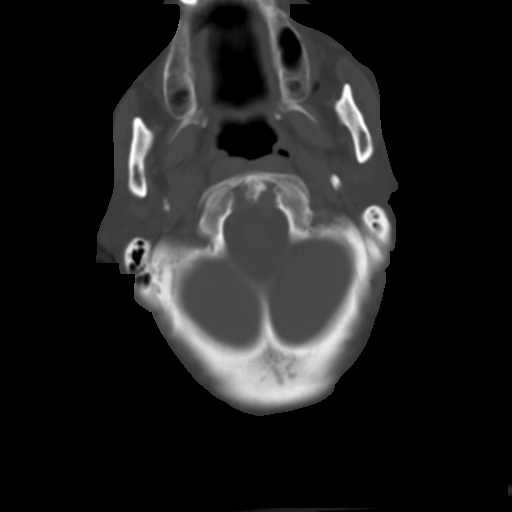
[im 9/34  brain]
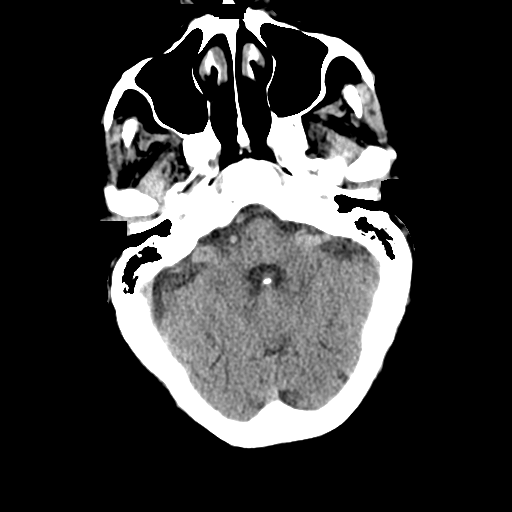
[im 13/34  brain]
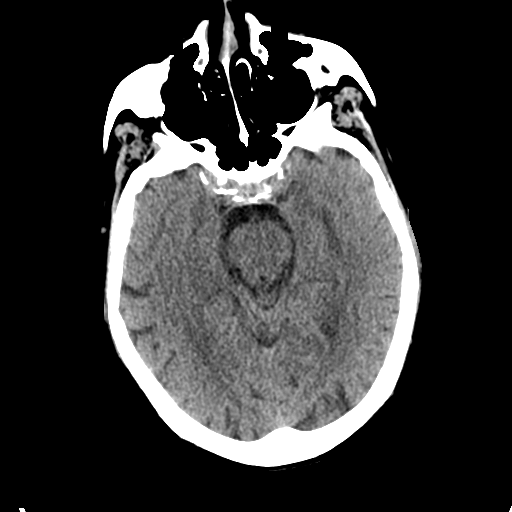
[im 17/34  brain]
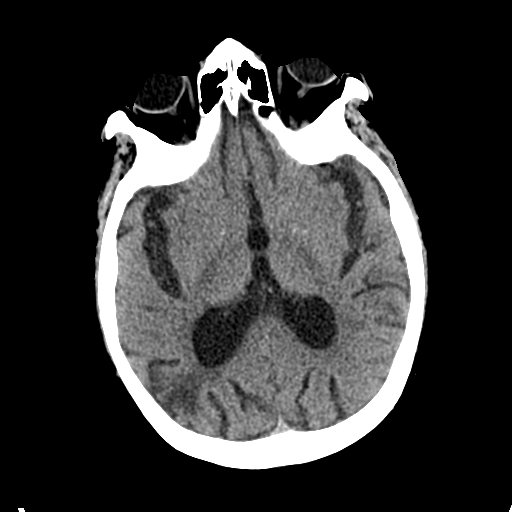
[im 21/34  brain]
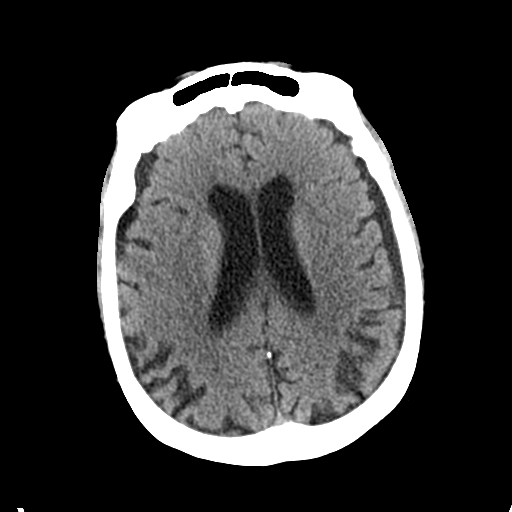
[im 21/34  bone]
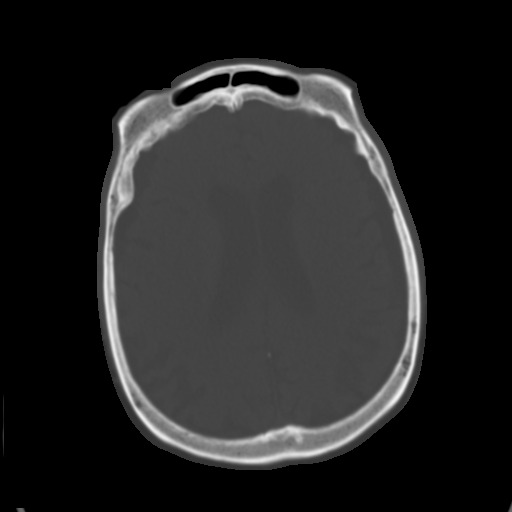
[im 25/34  brain]
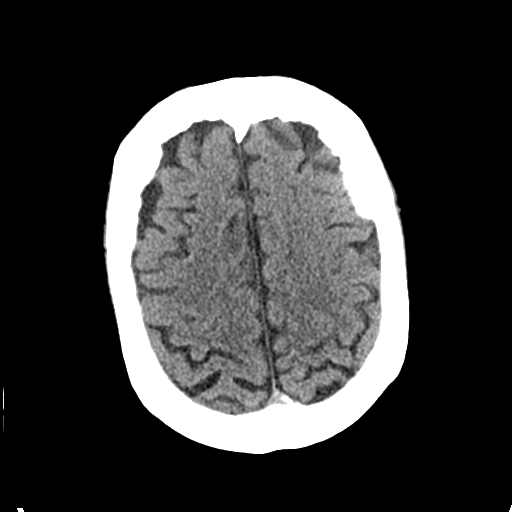
[im 29/34  brain]
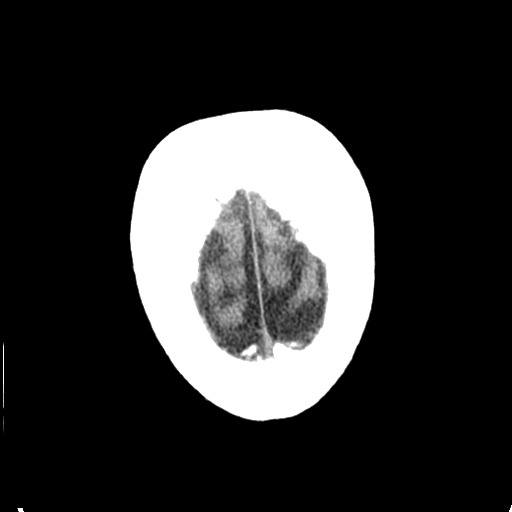

[Series 4: coronal soft tissue · coronal · 0.37mm/px · 3 of 75 slices shown]
[im 25/75  brain]
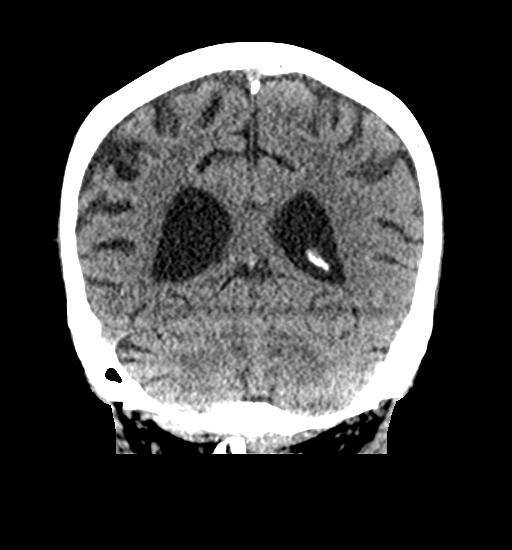
[im 33/75  brain]
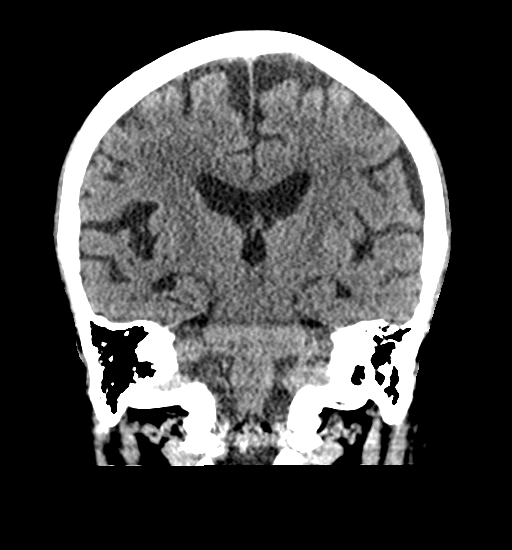
[im 42/75  brain]
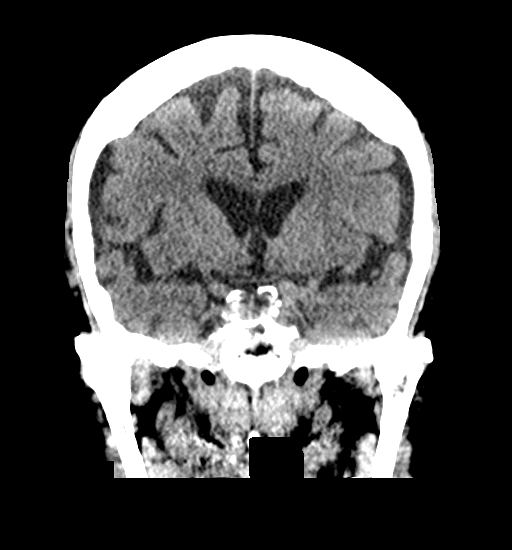

[Series 5: sagittal soft tissue · sagittal · 0.39mm/px · 3 of 54 slices shown]
[im 18/54  brain]
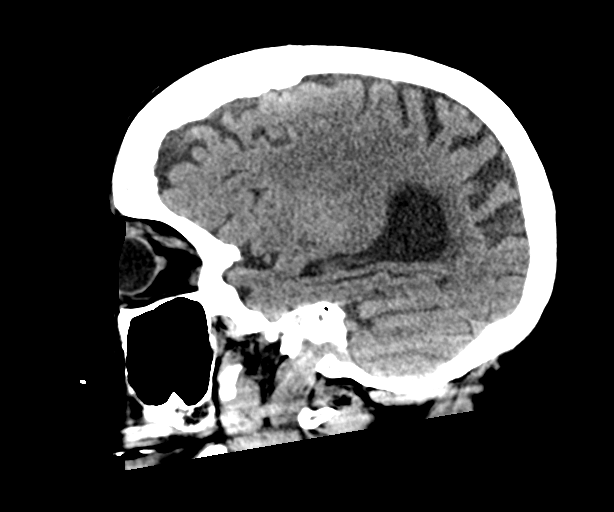
[im 27/54  brain]
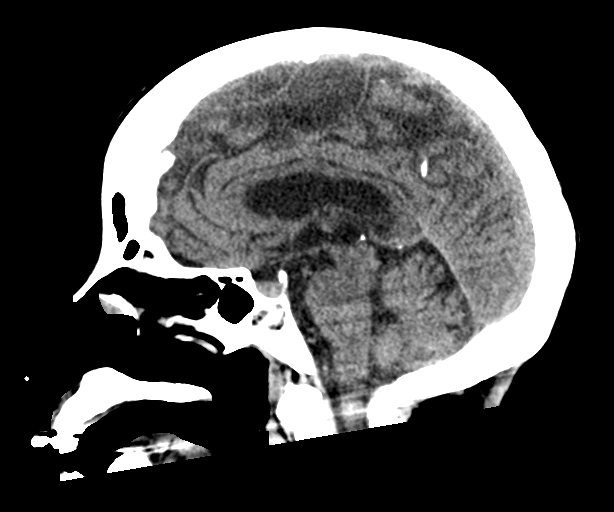
[im 36/54  brain]
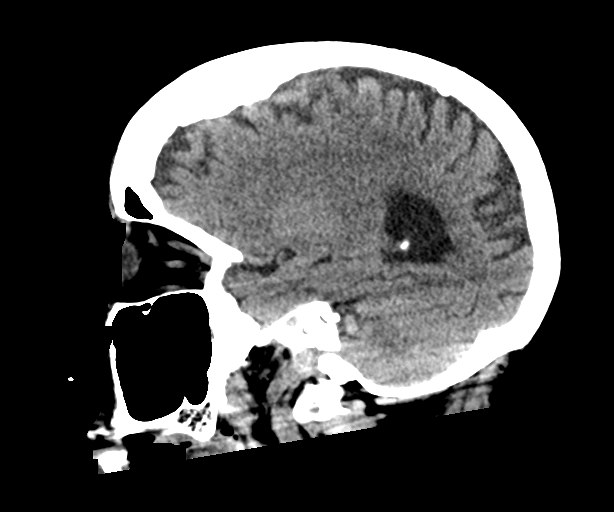

[17 of 47 positions shown; findings below may reference images not displayed]

FINDINGS: Brain: Mildly to moderately enlarged ventricles and subarachnoid
spaces. Small, old right posterior watershed infarct. Small
bilateral subdural hygromas. No intracranial hemorrhage, mass lesion
or CT evidence of acute infarction.

Vascular: No hyperdense vessel or unexpected calcification.

Skull: Normal. Negative for fracture or focal lesion.

Sinuses/Orbits: Status post bilateral cataract extraction.
Unremarkable bones and included paranasal sinuses.

Other: Left concha bullosa. Deviation of the midportion of the nasal
septum to the right.
IMPRESSION: 1. No acute abnormality.
2. Mild-to-moderate diffuse cerebral atrophy.
3. Small, old right posterior watershed cerebral infarct.
4. Small bilateral chronic subdural hygromas.

## 2022-10-14 ENCOUNTER — Encounter: Payer: Self-pay | Admitting: Podiatry

## 2022-10-14 ENCOUNTER — Ambulatory Visit (INDEPENDENT_AMBULATORY_CARE_PROVIDER_SITE_OTHER): Payer: 59 | Admitting: Podiatry

## 2022-10-14 DIAGNOSIS — R0989 Other specified symptoms and signs involving the circulatory and respiratory systems: Secondary | ICD-10-CM | POA: Diagnosis not present

## 2022-10-14 DIAGNOSIS — L97511 Non-pressure chronic ulcer of other part of right foot limited to breakdown of skin: Secondary | ICD-10-CM

## 2022-10-14 NOTE — Progress Notes (Signed)
She presents today for a chief concern of a painful wound to her right foot.  States that several weeks ago she injured her foot with a door slamming on it and has developed a sore.  She has been seen by nurse and EMTs and had related no infection but just is painful and that has not improved over the past several weeks.  Objective: Pulses are nonpalpable venous distention is present capillary fill time is within limits.  No significant change in temperature bilaterally.  1 cm ulcerative lesion navicular tuberosity area right foot with mild hyperkeratosis.  There is some granulation tissue at the base does not probe deep to bone.  I trimmed around the area today not demonstrated any purulence.  Assessment: Most likely peripheral vascular disease inhibiting normal healing of this small wound to the right foot.  Cannot rule out superficial bacterial colonization preventing the wound from healing.  Plan: Started her on doxycycline made referral to vascular for ABIs and treatment if necessary.  Also sending to wound care clinic.  Started Betadine dressing changes daily and will follow-up with her on an as-needed basis or if wound care is unable to get her in in a timely manner.

## 2022-10-15 ENCOUNTER — Telehealth: Payer: Self-pay | Admitting: *Deleted

## 2022-10-15 ENCOUNTER — Telehealth: Payer: Self-pay | Admitting: Podiatry

## 2022-10-15 MED ORDER — DOXYCYCLINE HYCLATE 100 MG PO TABS: 100.0000 mg | ORAL_TABLET | Freq: Two times a day (BID) | ORAL | 0 refills | Status: DC

## 2022-10-15 NOTE — Addendum Note (Signed)
Addended by: Tyson Dense T on: 10/15/2022 04:25 PM   Modules accepted: Orders

## 2022-10-15 NOTE — Telephone Encounter (Signed)
Pt dtr called to check the status of medication that was to be sent to pharmacy. It was an antibiotic and per the office note, Dr Milinda Pointer wanted to start her on doxycycline. Pt states she has been to the pharmacy twice today and the pharmacy has reached out to [place] about the medication as well.  Please send to: [facility] [id number] - Lorina Rabon, [place]   Please advise

## 2022-10-15 NOTE — Telephone Encounter (Signed)
Patient is calling to request something for pain of her foot, please/advise send to [place]

## 2022-10-18 NOTE — Telephone Encounter (Signed)
Spoke with daughter to update that medication has been sent to pharmacy

## 2022-10-24 ENCOUNTER — Other Ambulatory Visit: Payer: Self-pay | Admitting: Family Medicine

## 2022-10-24 ENCOUNTER — Ambulatory Visit: Payer: 59 | Attending: Podiatry

## 2022-10-24 DIAGNOSIS — L97511 Non-pressure chronic ulcer of other part of right foot limited to breakdown of skin: Secondary | ICD-10-CM | POA: Diagnosis not present

## 2022-10-24 DIAGNOSIS — R0989 Other specified symptoms and signs involving the circulatory and respiratory systems: Secondary | ICD-10-CM

## 2022-10-24 DIAGNOSIS — I739 Peripheral vascular disease, unspecified: Secondary | ICD-10-CM

## 2022-10-26 LAB — VAS US ABI WITH/WO TBI
Left ABI: 0.71
Right ABI: 1.02

## 2022-10-29 ENCOUNTER — Telehealth: Payer: Self-pay | Admitting: Dermatopathology

## 2022-10-29 ENCOUNTER — Telehealth: Payer: Self-pay | Admitting: *Deleted

## 2022-10-29 DIAGNOSIS — L97511 Non-pressure chronic ulcer of other part of right foot limited to breakdown of skin: Secondary | ICD-10-CM

## 2022-10-29 DIAGNOSIS — R0989 Other specified symptoms and signs involving the circulatory and respiratory systems: Secondary | ICD-10-CM

## 2022-10-29 NOTE — Telephone Encounter (Signed)
Patient's daughter would like to speak with the physician personally concerning results of the [place] and why she is being referred to Vascular surgery.

## 2022-10-29 NOTE — Telephone Encounter (Signed)
Order placed for vascular consult

## 2022-10-29 NOTE — Telephone Encounter (Signed)
Nurse recommended pt contact ordering physician

## 2022-10-29 NOTE — Telephone Encounter (Signed)
-----   Message from Garrel Ridgel, [place] sent at 10/29/2022 11:35 AM EST ----- Needs to be seen by vascular

## 2022-10-29 NOTE — Telephone Encounter (Signed)
New Message:      Patient's daughter called. She said Mother had a test last  week on 10-24-22. She said the patient told her that the [facility] said she need a type of pillow and was going to give her some socks. She said when the patient finish, she did not get anything. She would like to know what exactly was said to patient. She said she could not go back with.the patient.

## 2022-10-30 NOTE — Telephone Encounter (Signed)
Patient's daughter has been updated,verbalized understanding.

## 2022-11-06 ENCOUNTER — Ambulatory Visit: Payer: Medicare Other | Admitting: Internal Medicine

## 2022-11-07 ENCOUNTER — Encounter: Payer: 59 | Attending: Internal Medicine | Admitting: Internal Medicine

## 2022-11-07 DIAGNOSIS — J449 Chronic obstructive pulmonary disease, unspecified: Secondary | ICD-10-CM | POA: Insufficient documentation

## 2022-11-07 DIAGNOSIS — L97511 Non-pressure chronic ulcer of other part of right foot limited to breakdown of skin: Secondary | ICD-10-CM | POA: Diagnosis not present

## 2022-11-07 DIAGNOSIS — I10 Essential (primary) hypertension: Secondary | ICD-10-CM | POA: Diagnosis not present

## 2022-11-07 DIAGNOSIS — I70202 Unspecified atherosclerosis of native arteries of extremities, left leg: Secondary | ICD-10-CM | POA: Insufficient documentation

## 2022-11-07 DIAGNOSIS — I70248 Atherosclerosis of native arteries of left leg with ulceration of other part of lower left leg: Secondary | ICD-10-CM | POA: Diagnosis not present

## 2022-11-07 DIAGNOSIS — I2510 Atherosclerotic heart disease of native coronary artery without angina pectoris: Secondary | ICD-10-CM | POA: Diagnosis not present

## 2022-11-07 DIAGNOSIS — L97322 Non-pressure chronic ulcer of left ankle with fat layer exposed: Secondary | ICD-10-CM | POA: Diagnosis not present

## 2022-11-10 NOTE — Progress Notes (Signed)
Danielle [name], Danielle [name] ([id number]) [id number] Page 1 of 7 Visit Report for 11/07/2022 Chief Complaint Document Details Patient Name: Date of Service: Danielle Speed [name]. 11/07/2022 1:30 PM Medical Record Number: [id number] Patient Account Number: [id number] Date of Birth/Sex: Treating RN: 02-19-1929 (89 y.o. Danielle [name] Primary Care Provider: Denton Lank Other Clinician: Referring Provider: Treating Provider/Extender: RO BSO N, [place], Max Weeks in Treatment: 0 Information Obtained from: Patient Chief Complaint 11/07/2022; patient is here for review of a wound on the lateral midfoot Electronic Signature(s) Signed: 11/07/2022 4:44:08 PM By: Linton Ham MD Entered By: Linton Ham on 11/07/2022 14:28:02 -------------------------------------------------------------------------------- Debridement Details Patient Name: Date of Service: Danielle Blanc, Danielle RETHA [name]. 11/07/2022 1:30 PM Medical Record Number: [id number] Patient Account Number: [id number] Date of Birth/Sex: Treating RN: [birth year] (89 y.o. Danielle [name] Primary Care Provider: Denton Lank Other Clinician: Referring Provider: Treating Provider/Extender: RO BSO N, [place], Max Weeks in Treatment: 0 Debridement Performed for Assessment: Wound #1 Left,Medial Foot Performed By: Physician Ricard Dillon, MD Debridement Type: Debridement Severity of Tissue Pre Debridement: Limited to breakdown of skin Level of Consciousness (Pre-procedure): Awake and Alert Pre-procedure Verification/Time Out Yes - 14:10 Taken: Start Time: 14:10 T Area Debrided (L x W): otal 0.5 (cm) x 0.5 (cm) = 0.25 (cm) Tissue and other material debrided: Viable, Non-Viable, Slough, Subcutaneous, Skin: Dermis , Skin: Epidermis, Slough Level: Skin/Subcutaneous Tissue Debridement Description: Excisional Instrument: Curette Bleeding: Minimum Hemostasis Achieved: Pressure End Time: 14:15 Procedural Pain: 1 Post  Procedural Pain: 3 Response to Treatment: Procedure was tolerated well Danielle [name], Danielle [name] ([id number]) [id number] Page 2 of 7 Level of Consciousness (Post- Awake and Alert procedure): Post Debridement Measurements of Total Wound Length: (cm) 1 Width: (cm) 0.9 Depth: (cm) 0.4 Volume: (cm) 0.283 Character of Wound/Ulcer Post Debridement: Improved Severity of Tissue Post Debridement: Limited to breakdown of skin Post Procedure Diagnosis Same as Pre-procedure Electronic Signature(s) Signed: 11/07/2022 4:44:08 PM By: Linton Ham MD Signed: 11/08/2022 4:59:32 PM By: Carlene Coria RN Entered By: Linton Ham on 11/07/2022 14:27:26 -------------------------------------------------------------------------------- HPI Details Patient Name: Date of Service: Danielle Blanc, Danielle RETHA [name]. 11/07/2022 1:30 PM Medical Record Number: [id number] Patient Account Number: [id number] Date of Birth/Sex: Treating RN: May 06, 1929 (89 y.o. 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 Her daughter brought her in to look at an area she thought was healed on the left medial foot. She was referred here from [facility]. She has been using Betadine. She went for arterial studies on 10/24/2022 for but did not see a provider. The results on the right showed a ABI of 1.02 with multiphasic waveforms and on the left at 0.71 with multiphasic waveforms. TBI on the right was 0.53 and 0.43 on the left The patient is not complaining of pain as mentioned her family felt she was healed Past medical history includes COPD, hypertension, coronary artery disease all capitals Electronic Signature(s) Signed: 11/07/2022 4:44:08 PM By: Linton Ham MD Entered By:  Linton Ham on 11/07/2022 14:49:06 Physical Exam Details -------------------------------------------------------------------------------- Danielle [name], Danielle [name] ([id number]) [id number] Page 3 of 7 Patient Name: Date of Service: Danielle Speed [name]. 11/07/2022 1:30 PM Medical Record Number: [id number] Patient Account Number: [id number] Date of Birth/Sex: Treating RN: July 29, 1929 (89 y.o. F) [place],  Morey Hummingbird Primary Care Provider: Denton Lank Other Clinician: Referring Provider: Treating Provider/Extender: RO BSO N, [place], Max Weeks in Treatment: 0 Constitutional Patient is hypertensive.. Pulse regular and within target range for patient.Marland Kitchen Respirations regular, non-labored and within target range.. Temperature is normal and within the target range for the patient.Marland Kitchen appears in no distress. Cardiovascular Pedal pulses are reduced bilaterally in both feet.. Notes Wound exam; left medial foot. This is above the plantar surface. However there was eschar over the surface of this and under illumination obviously not completely closed. I remove the eschar there is a center divot of nonhealed wound however this is small does not probe to bone and has no evidence of infection Electronic Signature(s) Signed: 11/07/2022 4:44:08 PM By: Linton Ham MD Entered By: Linton Ham on 11/07/2022 14:50:47 -------------------------------------------------------------------------------- Physician Orders Details Patient Name: Date of Service: Danielle Blanc, Danielle RETHA [name]. 11/07/2022 1:30 PM Medical Record Number: [id number] Patient Account Number: [id number] Date of Birth/Sex: Treating RN: 05-26-1929 (89 y.o. Danielle [name] Primary Care Provider: Denton Lank Other Clinician: Referring Provider: Treating Provider/Extender: RO BSO N, [place], Max Weeks in Treatment: 0 Verbal / Phone Orders: No Diagnosis Coding Follow-up Appointments Return Appointment in 2  weeks. Bathing/ Shower/ Hygiene May shower; gently cleanse wound with antibacterial soap, rinse and pat dry prior to dressing wounds Anesthetic (Use 'Patient Medications' Section for Anesthetic Order Entry) Lidocaine applied to wound bed Edema Control - Lymphedema / Segmental Compressive Device / Other Elevate, Exercise Daily and A void Standing for Long Periods of Time. Elevate legs to the level of the heart and pump ankles as often as possible Elevate leg(s) parallel to the floor when sitting. Wound Treatment Wound #1 - Foot Wound Laterality: Left, Medial Cleanser: Byram Ancillary Kit - 15 Day Supply (DME) (Generic) 3 x Per Week/30 Days Discharge Instructions: Use supplies as instructed; Kit contains: (15) Saline Bullets; (15) 3x3 Gauze; 15 pr Gloves Cleanser: Soap and Water 3 x Per Week/30 Days Discharge Instructions: Gently cleanse wound with antibacterial soap, rinse and pat dry prior to dressing wounds Prim Dressing: Prisma 4.34 (in) 3 x Per Week/30 Days ary Discharge Instructions: Moisten w/normal saline or sterile water; Cover wound as directed. Danielle not remove from wound bed. Secondary Dressing: (BORDER) Zetuvit Plus SILICONE BORDER Dressing 4x4 (in/in) (DME) (Generic) 3 x Per Week/30 Days Discharge Instructions: Please Danielle not put silicone bordered dressings under wraps. Use non-bordered dressing only. Danielle [name], Danielle [name] ([id number]) [id number] Page 4 of 7 Electronic Signature(s) Signed: 11/07/2022 4:44:08 PM By: Linton Ham MD Signed: 11/08/2022 4:59:32 PM By: Carlene Coria RN Entered By: Carlene Coria on 11/07/2022 14:15:31 -------------------------------------------------------------------------------- Problem List Details Patient Name: Date of Service: Danielle Blanc, Danielle RETHA [name]. 11/07/2022 1:30 PM Medical Record Number: [id number] Patient Account Number: [id number] Date of Birth/Sex: Treating RN: 1928-09-13 (89 y.o. 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 11/07/2022 1:30 PM Medical Record Number: [id number] Patient Account Number: [id number] Date of Birth/Sex: Treating RN: Jun 20, 1929 (89 y.o. F)  Carlene Coria Primary Care Provider: Denton Lank Other Clinician: Referring Provider: Treating Provider/Extender: Eldridge Dace, [place], Max Weeks in Treatment: [address] 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 Her daughter brought her in to look at an area she thought was healed on the left medial foot. She was referred here from [facility]. She has been using Betadine. She went for arterial studies on 10/24/2022 for but did not see a provider. The results on the right showed a ABI of 1.02 with multiphasic waveforms and on the left at 0.71 with multiphasic waveforms. TBI on the right was 0.53 and 0.43 on the left The patient is not complaining of pain as mentioned her family felt she was healed Past medical history includes COPD, hypertension, coronary artery disease all capitals Patient History Allergies ACE Inhibitors,  penicillin Social History Never smoker, Marital Status - Widowed, Alcohol Use - Never, Drug Use - No History, Caffeine Use - Daily. Medical History Respiratory Patient has history of Chronic Obstructive Pulmonary Disease (COPD) Cardiovascular Patient has history of Coronary Artery Disease, Hypertension Objective Constitutional Patient is hypertensive.. Pulse regular and within target range for patient.Marland Kitchen Respirations regular, non-labored and within target range.. Temperature is normal and within the target range for the patient.Marland Kitchen appears in no distress. Vitals Time Taken: 1:40 PM, Height: 63 in, Source: Stated, Weight: 147 lbs, Source: Stated, BMI: 26, Temperature: 98.1 F, Pulse: 69 bpm, Respiratory Rate: 8 breaths/min, Blood Pressure: 178/73 mmHg. Cardiovascular Pedal pulses are reduced bilaterally in both feet.. General Notes: Wound exam; left medial foot. This is above the plantar surface. However there was eschar over the surface of this and under illumination obviously not completely closed. I remove the eschar there is a center divot of nonhealed wound however this is small does not probe to bone and has no evidence of infection Integumentary (Hair, Skin) Wound #1 status is Open. Original cause of wound was Gradually Appeared. The date acquired was: 09/02/2022. The wound is located on the Left,Medial Foot. The wound measures 0.2cm length x 0.2cm width x 0.2cm depth; 0.031cm^2 area and 0.006cm^3 volume. There is Fat Layer (Subcutaneous Tissue) exposed. There is no tunneling or undermining noted. There is a medium amount of serosanguineous drainage noted. There is no granulation within the wound bed. There is a large (67-100%) amount of necrotic tissue within the wound bed including Adherent Slough. 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 There was a Excisional Skin/Subcutaneous Tissue Debridement with a total area of 0.25 sq cm performed by Ricard Dillon, MD. With the following instrument(s): Curette to remove Viable and Non-Viable tissue/material. Material removed includes Subcutaneous Tissue, [address], [address]: Danielle [name], Sayda [name] ([id number]) [id number] Page 6 of 7 Dermis, and Skin: Epidermis. No specimens were taken. A time out was conducted at 14:10, prior to the start of the procedure. A Minimum amount of bleeding was controlled with Pressure. The procedure was tolerated well with a pain level of 1 throughout and a pain level of 3 following the procedure. Post Debridement Measurements: 1cm length  x 0.9cm width x 0.4cm depth; 0.283cm^3 volume. Character of Wound/Ulcer Post Debridement is improved. Severity of Tissue Post Debridement is: Limited to breakdown of skin. Post procedure Diagnosis Wound #1: Same as Pre-Procedure Plan Follow-up Appointments: Return Appointment in 2 weeks. Bathing/ Shower/ Hygiene: May shower; gently cleanse wound with antibacterial soap, rinse and pat dry prior to dressing wounds Anesthetic (Use 'Patient Medications' Section for Anesthetic Order Entry): Lidocaine applied to wound bed Edema Control - Lymphedema / Segmental Compressive Device / Other: Elevate, Exercise Daily and Avoid Standing for Long Periods of Time. Elevate legs to the level of the heart and pump ankles as often as possible Elevate leg(s) parallel to the floor when sitting. WOUND #1: - Foot Wound Laterality: Left, Medial Cleanser: Byram Ancillary Kit - 15 Day Supply (DME) (Generic) 3 x Per Week/30 Days Discharge Instructions: Use supplies as instructed; Kit contains: (15) Saline Bullets; (15) 3x3 Gauze; 15 pr Gloves Cleanser: Soap and Water 3 x Per Week/30 Days Discharge  Instructions: Gently cleanse wound with antibacterial soap, rinse and pat dry prior to dressing wounds Prim Dressing: Prisma 4.34 (in) 3 x Per Week/30 Days ary Discharge Instructions: Moisten w/normal saline or sterile water; Cover wound as directed. Danielle not remove from wound bed. Secondary Dressing: (BORDER) Zetuvit Plus SILICONE BORDER Dressing 4x4 (in/in) (DME) (Generic) 3 x Per Week/30 Days Discharge Instructions: Please Danielle not put silicone bordered dressings under wraps. Use non-bordered dressing only. 1. Under superficial review this was not closed in a small aspect 2. I took care to show this to the patient and her daughter 48. We are going to use Prisma and border foam change every 2 days 4. I think this patient probably has distal ischemia. 5 fortunately I think the tissue around the wound area does not look too bad. It does not probe to bone Electronic Signature(s) Signed: 11/07/2022 4:44:08 PM By: Linton Ham MD Entered By: Linton Ham on 11/07/2022 14:53:07 -------------------------------------------------------------------------------- ROS/PFSH Details Patient Name: Date of Service: Danielle Blanc, Danielle RETHA [name]. 11/07/2022 1:30 PM Medical Record Number: [id number] Patient Account Number: [id number] Date of Birth/Sex: Treating RN: 1929-04-22 (89 y.o. Danielle [name] Primary Care Provider: Denton Lank Other Clinician: Referring Provider: Treating Provider/Extender: RO BSO N, [place], Max Weeks in Treatment: 0 Respiratory Medical History: Positive for: Chronic Obstructive Pulmonary Disease (COPD) Cardiovascular Medical History: Positive for: Coronary Artery Disease; Hypertension Immunizations Danielle [name], Danielle [name] ([id number]) [id number] Page 7 of 7 Pneumococcal Vaccine: Received Pneumococcal Vaccination: No Implantable Devices None Family and Social History Never smoker; Marital Status - Widowed; Alcohol Use: Never; Drug Use: No History;  Caffeine Use: Daily Electronic Signature(s) Signed: 11/07/2022 4:44:08 PM By: Linton Ham MD Signed: 11/08/2022 4:59:32 PM By: Carlene Coria RN Entered By: Carlene Coria on 11/07/2022 13:46:32 -------------------------------------------------------------------------------- SuperBill Details Patient Name: Date of Service: Danielle Blanc, Danielle RETHA [name]. 11/07/2022 Medical Record Number: [id number] Patient Account Number: [id number] Date of Birth/Sex: Treating RN: [birth year] (89 y.o. 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 Facility Procedures : CPT4 Code: [id number] Description: 9213 - WOUND CARE VISIT-LEV 3 EST PT Modifier: Quantity: 1 : CPT4 Code: [id number] Description: [id number] - DEB SUBQ TISSUE 20 SQ CM/< ICD-10 Diagnosis Description I70.248 Atherosclerosis of native arteries of left leg with ulceration  of other part of lo Modifier: wer leg Quantity: 1 Physician Procedures : CPT4 Code Description Modifier [id number] [place] PHYS LEVEL 3 NEW PT ICD-10 Diagnosis Description 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

## 2022-11-10 NOTE — Progress Notes (Signed)
[name], Danielle [name] ([id number]) [id number] Page 1 of 9 Visit Report for 11/07/2022 Allergy List Details Patient Name: Date of Service: Danielle [name]. 11/07/2022 1:30 PM Medical Record Number: [id number] Patient Account Number: [id number] Date of Birth/Sex: Treating RN: 1929-06-17 (89 y.o. Orvan Falconer Primary Care Kaneisha Ellenberger: Denton Lank Other Clinician: Referring Ante Arredondo: Treating Ariyonna Twichell/Extender: RO BSO N, [place], Max Weeks in Treatment: 0 Allergies Active Allergies ACE Inhibitors penicillin Allergy Notes Electronic Signature(s) Signed: 11/08/2022 4:59:32 PM By: Carlene Coria RN Entered By: Carlene Coria on 11/07/2022 13:44:45 -------------------------------------------------------------------------------- Arrival Information Details Patient Name: Date of Service: Danielle Blanc, DO Danielle [name]. 11/07/2022 1:30 PM Medical Record Number: [id number] Patient Account Number: [id number] Date of Birth/Sex: Treating RN: September 30, 1928 (89 y.o. Orvan Falconer Primary Care Ainsley Deakins: [place] Lank Other Clinician: Referring Lynsi Dooner: Treating Ellee Wawrzyniak/Extender: RO BSO N, [place], Max Weeks in Treatment: 0 Visit Information Patient Arrived: Gilford Rile Arrival Time: 13:42 Accompanied By: daughter Transfer Assistance: None Patient Identification Verified: Yes Secondary Verification Process Completed: Yes Patient Requires Transmission-Based Precautions: No Patient Has Alerts: No Electronic Signature(s) Signed: 11/08/2022 4:59:32 PM By: Carlene Coria RN [name], Danielle [name] ([id number]) [id number] Page 2 of 9 Entered By: Carlene Coria on 11/07/2022 13:42:43 -------------------------------------------------------------------------------- Clinic Level of Care Assessment Details Patient Name: Date of Service: Danielle [name]. 11/07/2022 1:30 PM Medical Record Number: [id number] Patient Account Number: [id number] Date of Birth/Sex: Treating  RN: [birth year] (89 y.o. Orvan Falconer Primary Care Annelyse Rey: [place] Lank Other Clinician: Referring Akiba Melfi: Treating Hazem Kenner/Extender: RO BSO N, [place], Max Weeks in Treatment: 0 Clinic Level of Care Assessment Items TOOL 1 Quantity Score X- 1 0 Use when EandM and Procedure is performed on INITIAL visit ASSESSMENTS - Nursing Assessment / Reassessment X- 1 20 General Physical Exam (combine w/ comprehensive assessment (listed just below) when performed on new pt. evals) X- 1 25 Comprehensive Assessment (HX, ROS, Risk Assessments, Wounds Hx, etc.) ASSESSMENTS - Wound and Skin Assessment / Reassessment '[]'$  - 0 Dermatologic / Skin Assessment (not related to wound area) ASSESSMENTS - Ostomy and/or Continence Assessment and Care '[]'$  - 0 Incontinence Assessment and Management '[]'$  - 0 Ostomy Care Assessment and Management (repouching, etc.) PROCESS - Coordination of Care X - Simple Patient / Family Education for ongoing care 1 15 '[]'$  - 0 Complex (extensive) Patient / Family Education for ongoing care X- 1 10 Staff obtains Programmer, systems, Records, T Results / Process Orders est '[]'$  - 0 Staff telephones HHA, Nursing Homes / Clarify orders / etc '[]'$  - 0 Routine Transfer to another Facility (non-emergent condition) '[]'$  - 0 Routine Hospital Admission (non-emergent condition) X- 1 15 New Admissions / Biomedical engineer / Ordering NPWT Apligraf, etc. , '[]'$  - 0 Emergency Hospital Admission (emergent condition) PROCESS - Special Needs '[]'$  - 0 Pediatric / Minor Patient Management '[]'$  - 0 Isolation Patient Management '[]'$  - 0 Hearing / Language / Visual special needs '[]'$  - 0 Assessment of Community assistance (transportation, D/C planning, etc.) '[]'$  - 0 Additional assistance / Altered mentation '[]'$  - 0 Support Surface(s) Assessment (bed, cushion, seat, etc.) INTERVENTIONS - Miscellaneous '[]'$  - 0 External ear exam '[]'$  - 0 Patient Transfer (multiple staff / Civil Service fast streamer / Similar  devices) '[]'$  - 0 Simple Staple / Suture removal (25 or less) '[]'$  - 0 Complex Staple / Suture removal (26 or more) [name], Danielle [name] ([id number]) [id number] Page 3 of 9 '[]'$  - 0 Hypo/Hyperglycemic Management (do not check if billed  separately) '[]'$  - 0 Ankle / Brachial Index (ABI) - do not check if billed separately Has the patient been seen at the hospital within the last three years: Yes Total Score: 85 Level Of Care: New/Established - Level 3 Electronic Signature(s) Signed: 11/08/2022 4:59:32 PM By: Carlene Coria RN Entered By: Carlene Coria on 11/07/2022 14:15:58 -------------------------------------------------------------------------------- Encounter Discharge Information Details Patient Name: Date of Service: Danielle Blanc, DO Danielle [name]. 11/07/2022 1:30 PM Medical Record Number: [id number] Patient Account Number: [id number] Date of Birth/Sex: Treating RN: 07-08-1929 (89 y.o. Orvan Falconer Primary Care Quina Wilbourne: D[place] Lank Other Clinician: Referring Janeese Mcgloin: Treating Earmon Sherrow/Extender: RO BSO N, [place], Max Weeks in Treatment: 0 Encounter Discharge Information Items Post Procedure Vitals Discharge Condition: Stable Temperature (F): 98.1 Ambulatory Status: Wheelchair Pulse (bpm): 69 Discharge Destination: Home Respiratory Rate (breaths/min): 18 Transportation: Private Auto Blood Pressure (mmHg): 178/73 Accompanied By: daughter Schedule Follow-up Appointment: Yes Clinical Summary of Care: Electronic Signature(s) Signed: 11/08/2022 4:59:32 PM By: Carlene Coria RN Entered By: Carlene Coria on 11/07/2022 14:16:57 -------------------------------------------------------------------------------- Lower Extremity Assessment Details Patient Name: Date of Service: Danielle [name]. 11/07/2022 1:30 PM Medical Record Number: [id number] Patient Account Number: [id number] Date of Birth/Sex: Treating RN: 05-11-1929 (89 y.o. Orvan Falconer Primary Care Savion Washam:  [place] Lank Other Clinician: Referring Keyonta Barradas: Treating Tyger Wichman/Extender: RO BSO N, [place], Max Weeks in Treatment: 0 Edema Assessment Assessed: [Left: No] [Right: No] Edema: [Left: N] [Right: o] J[LeftSHALEENA, SILLERS ([id number] [Right: [id number] Page 4 of 9] Vascular Assessment Pulses: Dorsalis Pedis Palpable: [Right:Yes] Electronic Signature(s) Signed: 11/08/2022 4:59:32 PM By: Carlene Coria RN Entered By: Carlene Coria on 11/07/2022 13:53:40 -------------------------------------------------------------------------------- Multi Wound Chart Details Patient Name: Date of Service: Danielle Blanc, DO Danielle [name]. 11/07/2022 1:30 PM Medical Record Number: [id number] Patient Account Number: [id number] Date of Birth/Sex: Treating RN: 04/09/1929 (89 y.o. Orvan Falconer Primary Care Keimari : Denton Lank Other Clinician: Referring Demauri Advincula: Treating Angelmarie Ponzo/Extender: RO BSO N, [place], Max Weeks in Treatment: 0 Vital Signs Height(in): 63 Pulse(bpm): 69 Weight(lbs): 147 Blood Pressure(mmHg): 178/73 Body Mass Index(BMI): 26 Temperature(F): 98.1 Respiratory Rate(breaths/min): 8 [1:Photos:] [N/A:N/A] Left, Medial Foot N/A N/A Wound Location: Gradually Appeared N/A N/A Wounding Event: Lesion N/A N/A Primary Etiology: Chronic Obstructive Pulmonary N/A N/A Comorbid History: Disease (COPD), Coronary Artery Disease, Hypertension 09/02/2022 N/A N/A Date Acquired: 0 N/A N/A Weeks of Treatment: Open N/A N/A Wound Status: No N/A N/A Wound Recurrence: 0.2x0.2x0.2 N/A N/A Measurements L x W x D (cm) 0.031 N/A N/A A (cm) : rea 0.006 N/A N/A Volume (cm) : Full Thickness Without Exposed N/A N/A Classification: Support Structures Medium N/A N/A Exudate Amount: Serosanguineous N/A N/A Exudate Type: red, brown N/A N/A Exudate Color: None Present (0%) N/A N/A Granulation Amount: Large (67-100%) N/A N/A Necrotic Amount: Fat Layer  (Subcutaneous Tissue): Yes N/A N/A Exposed Structures: Fascia: No Tendon: No Muscle: No Joint: No [name], Mikahla [name] ([id number]) [id number] Page 5 of 9 Bone: No Debridement - Excisional N/A N/A Debridement: 14:10 N/A N/A Pre-procedure Verification/Time Out Taken: Subcutaneous, Slough N/A N/A Tissue Debrided: Skin/Subcutaneous Tissue N/A N/A Level: 0.25 N/A N/A Debridement A (sq cm): rea Curette N/A N/A Instrument: Minimum N/A N/A Bleeding: Pressure N/A N/A Hemostasis A chieved: 0 N/A N/A Procedural Pain: 0 N/A N/A Post Procedural Pain: Procedure was tolerated well N/A N/A Debridement Treatment Response: 1x0.9x0.4 N/A N/A Post Debridement Measurements L x W x D (cm) 0.283 N/A N/A Post Debridement Volume: (cm) Debridement N/A  N/A Procedures Performed: Treatment Notes Wound #1 (Foot) Wound Laterality: Left, Medial Cleanser Byram Ancillary Kit - 15 Day Supply Discharge Instruction: Use supplies as instructed; Kit contains: (15) Saline Bullets; (15) 3x3 Gauze; 15 pr Gloves Soap and Water Discharge Instruction: Gently cleanse wound with antibacterial soap, rinse and pat dry prior to dressing wounds Peri-Wound Care Topical Primary Dressing Prisma 4.34 (in) Discharge Instruction: Moisten w/normal saline or sterile water; Cover wound as directed. Do not remove from wound bed. Secondary Dressing (BORDER) Zetuvit Plus SILICONE BORDER Dressing 4x4 (in/in) Discharge Instruction: Please do not put silicone bordered dressings under wraps. Use non-bordered dressing only. Secured With Compression Wrap Compression Stockings Environmental education officer) Signed: 11/07/2022 4:44:08 PM By: Linton Ham MD Entered By: Linton Ham on 11/07/2022 14:24:36 -------------------------------------------------------------------------------- Multi-Disciplinary Care Plan Details Patient Name: Date of Service: Danielle Blanc, DO Danielle [name]. 11/07/2022 1:30  PM Medical Record Number: [id number] Patient Account Number: [id number] Date of Birth/Sex: Treating RN: 1929/07/10 (89 y.o. Orvan Falconer Primary Care Alfreida Steffenhagen: Denton Lank Other Clinician: Referring Login Muckleroy: Treating Presley Summerlin/Extender: Eldridge Dace, [place], Max Weeks in Treatment: 0 [name], [place] [name] ([id number]) [id number] Page 6 of 9 Active Inactive Necrotic Tissue Nursing Diagnoses: Knowledge deficit related to management of necrotic/devitalized tissue Goals: Necrotic/devitalized tissue will be minimized in the wound bed Date Initiated: 11/07/2022 Target Resolution Date: 12/08/2022 Goal Status: Active Interventions: Assess patient pain level pre-, during and post procedure and prior to discharge Notes: Wound/Skin Impairment Nursing Diagnoses: Knowledge deficit related to ulceration/compromised skin integrity Goals: Patient/caregiver will verbalize understanding of skin care regimen Date Initiated: 11/07/2022 Target Resolution Date: 12/08/2022 Goal Status: Active Ulcer/skin breakdown will have a volume reduction of 30% by week 4 Date Initiated: 11/07/2022 Target Resolution Date: 12/08/2022 Goal Status: Active Ulcer/skin breakdown will have a volume reduction of 50% by week 8 Date Initiated: 11/07/2022 Target Resolution Date: 01/05/2023 Goal Status: Active Ulcer/skin breakdown will have a volume reduction of 80% by week 12 Date Initiated: 11/07/2022 Target Resolution Date: 02/07/2023 Goal Status: Active Ulcer/skin breakdown will heal within 14 weeks Date Initiated: 11/07/2022 Target Resolution Date: 03/09/2023 Goal Status: Active Interventions: Assess patient/caregiver ability to obtain necessary supplies Assess patient/caregiver ability to perform ulcer/skin care regimen upon admission and as needed Assess ulceration(s) every visit Notes: Electronic Signature(s) Signed: 11/08/2022 4:59:32 PM By: Carlene Coria RN Entered By: Carlene Coria on 11/07/2022  13:58:57 -------------------------------------------------------------------------------- Pain Assessment Details Patient Name: Date of Service: Danielle [name]. 11/07/2022 1:30 PM Medical Record Number: [id number] Patient Account Number: [id number] Date of Birth/Sex: Treating RN: 12-Mar-1929 (89 y.o. Orvan Falconer Primary Care Kirra Verga: Denton Lank Other Clinician: Referring Maysoon Lozada: Treating Ashleen Demma/Extender: Eldridge Dace, [place], Max Weeks in Treatment: [address] [name], Juanitta [name] ([id number]) [id number] Page 7 of 9 Location of Pain Severity and Description of Pain Patient Has Paino No Site Locations Pain Management and Medication Current Pain Management: Electronic Signature(s) Signed: 11/08/2022 4:59:32 PM By: Carlene Coria RN Entered By: Carlene Coria on 11/07/2022 13:42:56 -------------------------------------------------------------------------------- Patient/Caregiver Education Details Patient Name: Date of Service: Kathrynn Humble 3/7/2024andnbsp1:30 PM Medical Record Number: [id number] Patient Account Number: [id number] Date of Birth/Gender: Treating RN: 01/05/1929 (89 y.o. Orvan Falconer Primary Care Physician: D[place] Lank Other Clinician: Referring Physician: Treating Physician/Extender: RO BSO N, [place], Max Weeks in Treatment: 0 Education Assessment Education Provided To: Patient Education Topics Provided Welcome T [facility] Packet: o Methods: Explain/Verbal Responses: State content correctly  Wound/Skin Impairment: Electronic Signature(s) Signed: 11/08/2022 4:59:32 PM By: Carlene Coria RN Entered By: Carlene Coria on 11/07/2022 13:59:10 [name], Juleen [name] ([id number]) [id number] Page 8 of 9 -------------------------------------------------------------------------------- Wound Assessment Details Patient Name: Date of Service: Danielle [name].  11/07/2022 1:30 PM Medical Record Number: [id number] Patient Account Number: [id number] Date of Birth/Sex: Treating RN: 1929-06-08 (89 y.o. Orvan Falconer Primary Care Latria Mccarron: Denton Lank Other Clinician: Referring Jahleel Stroschein: Treating Brandell Maready/Extender: RO BSO N, [place], Max Weeks in Treatment: 0 Wound Status Wound Number: 1 Primary Lesion Etiology: Wound Location: Left, Medial Foot Wound Open Wounding Event: Gradually Appeared Status: Date Acquired: 09/02/2022 Comorbid Chronic Obstructive Pulmonary Disease (COPD), Coronary Weeks Of Treatment: 0 History: Artery Disease, Hypertension Clustered Wound: No Photos Wound Measurements Length: (cm) 0.2 Width: (cm) 0.2 Depth: (cm) 0.2 Area: (cm) 0.031 Volume: (cm) 0.006 % Reduction in Area: % Reduction in Volume: Tunneling: No Undermining: No Wound Description Classification: Full Thickness Without Exposed Support Exudate Amount: Medium Exudate Type: Serosanguineous Exudate Color: red, brown Structures Foul Odor After Cleansing: No Slough/Fibrino Yes Wound Bed Granulation Amount: None Present (0%) Exposed Structure Necrotic Amount: Large (67-100%) Fascia Exposed: No Necrotic Quality: Adherent Slough Fat Layer (Subcutaneous Tissue) Exposed: Yes Tendon Exposed: No Muscle Exposed: No Joint Exposed: No Bone Exposed: No Electronic Signature(s) Signed: 11/08/2022 4:59:32 PM By: Carlene Coria RN Entered By: Carlene Coria on 11/07/2022 13:52:46 [name], Swayzee [name] ([id number]) [id number].pdf Page 9 of 9 -------------------------------------------------------------------------------- Vitals Details Patient Name: Date of Service: Danielle [name]. 11/07/2022 1:30 PM Medical Record Number: [id number] Patient Account Number: [id number] Date of Birth/Sex: Treating RN: 04/14/1929 (89 y.o. Orvan Falconer Primary Care Bambi Fehnel: [place] Lank Other Clinician: Referring Markies Mowatt: Treating Otila Starn/Extender:  RO BSO N, [place], Max Weeks in Treatment: 0 Vital Signs Time Taken: 13:40 Temperature (F): 98.1 Height (in): 63 Pulse (bpm): 69 Source: Stated Respiratory Rate (breaths/min): 8 Weight (lbs): 147 Blood Pressure (mmHg): 178/73 Source: Stated Reference Range: 80 - 120 mg / dl Body Mass Index (BMI): 26 Electronic Signature(s) Signed: 11/08/2022 4:59:32 PM By: Carlene Coria RN Entered By: Carlene Coria on 11/07/2022 13:44:17

## 2022-11-10 NOTE — Progress Notes (Signed)
Danielle [name] ([id number]) [phone] [id number] Page 1 of 5 Visit Report for 11/07/2022 Abuse Risk Screen Details Patient Name: Date of Service: Danielle Speed [name]. 11/07/2022 1:30 PM Medical Record Number: [id number] Patient Account Number: [id number] Date of Birth/Sex: Treating RN: 1929-07-14 (89 y.o. Orvan Falconer Primary Care Averee Harb: Denton Lank Other Clinician: Referring Laterra Lubinski: Treating Linda Biehn/Extender: RO BSO N, [place], Max Weeks in Treatment: 0 Abuse Risk Screen Items Answer ABUSE RISK SCREEN: Has anyone close to you tried to hurt or harm you recentlyo No Do you feel uncomfortable with anyone in your familyo No Has anyone forced you do things that you didnt want to doo No Electronic Signature(s) Signed: 11/08/2022 4:59:32 PM By: Carlene Coria RN Entered By: Carlene Coria on 11/07/2022 13:46:38 -------------------------------------------------------------------------------- Activities of Daily Living Details Patient Name: Date of Service: Danielle Speed [name]. 11/07/2022 1:30 PM Medical Record Number: [id number] Patient Account Number: [id number] Date of Birth/Sex: Treating RN: 1929-02-15 (89 y.o. Orvan Falconer Primary Care Leza Apsey: [place] Lank Other Clinician: Referring Travus Oren: Treating Lora Glomski/Extender: RO BSO N, [place], Max Weeks in Treatment: 0 Activities of Daily Living Items Answer Activities of Daily Living (Please select one for each item) Drive Automobile Not Able T Medications ake Need Assistance Use T elephone Need Assistance Care for Appearance Need Assistance Use T oilet Need Assistance Bath / Shower Need Assistance Dress Self Need Assistance Feed Self Completely Able Walk Not Able Get In / Out Bed Need Assistance Housework Not Able Danielle [name] ([id number]) [phone] [id number].pdf Page 2 of 5 Prepare Meals Not Able Handle Money Need Assistance Shop for Self Need  Assistance Electronic Signature(s) Signed: 11/08/2022 4:59:32 PM By: Carlene Coria RN Entered By: Carlene Coria on 11/07/2022 13:47:10 -------------------------------------------------------------------------------- Education Screening Details Patient Name: Date of Service: Danielle [name]. 11/07/2022 1:30 PM Medical Record Number: [id number] Patient Account Number: [id number] Date of Birth/Sex: Treating RN: 09-11-1928 (89 y.o. Orvan Falconer Primary Care Dolora Ridgely: D[place] Lank Other Clinician: Referring [name]: Treating [name]/Extender: RO BSO N, [place], Max Weeks in Treatment: 0 Primary Learner Assessed: Patient Learning Preferences/Education Level/Primary Language Learning Preference: Explanation Highest Education Level: High School Preferred Language: English Cognitive Barrier Language Barrier: No Translator Needed: No Memory Deficit: No Emotional Barrier: No Cultural/Religious Beliefs Affecting Medical Care: No Physical Barrier Impaired Vision: Yes Glasses Impaired Hearing: No Decreased Hand dexterity: No Knowledge/Comprehension Knowledge Level: Medium Comprehension Level: Medium Ability to understand written instructions: Medium Ability to understand verbal instructions: Medium Motivation Anxiety Level: Anxious Cooperation: Cooperative Education Importance: Acknowledges Need Interest in Health Problems: Asks Questions Perception: Coherent Willingness to Engage in Self-Management High Activities: Readiness to Engage in Self-Management High Activities: Electronic Signature(s) Signed: 11/08/2022 4:59:32 PM By: Carlene Coria RN Entered By: Carlene Coria on 11/07/2022 13:47:41 Danielle [name] ([id number]) [id number] Page 3 of 5 -------------------------------------------------------------------------------- Fall Risk Assessment Details Patient Name: Date of Service: Danielle Speed [name]. 11/07/2022 1:30 PM Medical Record  Number: [id number] Patient Account Number: [id number] Date of Birth/Sex: Treating RN: 05-05-1929 (89 y.o. Orvan Falconer Primary Care [name]: D[place] Lank Other Clinician: Referring [name]: Treating [name]/Extender: RO BSO N, [place], Max Weeks in Treatment: 0 Fall Risk Assessment Items Have you had 2 or more falls in the last 12 monthso 0 No Have you had any fall that resulted in injury in the last 12 monthso 0 No FALLS RISK SCREEN History of falling - immediate or within  3 months 0 No Secondary diagnosis (Do you have 2 or more medical diagnoseso) 0 No Ambulatory aid None/bed rest/wheelchair/nurse 0 Yes Crutches/cane/walker 0 No Furniture 0 No Intravenous therapy Access/Saline/Heparin Lock 0 No Gait/Transferring Normal/ bed rest/ wheelchair 0 Yes Weak (short steps with or without shuffle, stooped but able to lift head while walking, may seek 0 No support from furniture) Impaired (short steps with shuffle, may have difficulty arising from chair, head down, impaired 0 No balance) Mental Status Oriented to own ability 0 Yes Electronic Signature(s) Signed: 11/08/2022 4:59:32 PM By: Carlene Coria RN Entered By: Carlene Coria on 11/07/2022 13:48:01 -------------------------------------------------------------------------------- Foot Assessment Details Patient Name: Date of Service: Danielle Speed [name]. 11/07/2022 1:30 PM Medical Record Number: [id number] Patient Account Number: [id number] Date of Birth/Sex: Treating RN: [birth year] (89 y.o. Orvan Falconer Primary Care Cadey Bazile: Denton Lank Other Clinician: Referring Kaiyon Hynes: Treating [name]/Extender: RO BSO N, [place], Max Weeks in Treatment: 0 Foot Assessment Items Site Locations [name], [place] [name] ([id number]) [phone] [id number].pdf Page 4 of 5 + = Sensation present, - = Sensation absent, C = Callus, U = Ulcer [name] = Redness, W = Warmth, M = Maceration, PU = Pre-ulcerative lesion F =  Fissure, S = Swelling, D = Dryness Assessment Right: Left: Other Deformity: No No Prior Foot Ulcer: No No Prior Amputation: No No Charcot Joint: No No Ambulatory Status: Non-ambulatory Assistance Device: Wheelchair Gait: Administrator, arts) Signed: 11/08/2022 4:59:32 PM By: Carlene Coria RN Entered By: Carlene Coria on 11/07/2022 13:49:55 -------------------------------------------------------------------------------- Nutrition Risk Screening Details Patient Name: Date of Service: Danielle Speed [name]. 11/07/2022 1:30 PM Medical Record Number: [id number] Patient Account Number: [id number] Date of Birth/Sex: Treating RN: 03/26/1929 (89 y.o. Orvan Falconer Primary Care Preslea Rhodus: Denton Lank Other Clinician: Referring Ilani Otterson: Treating Lenea Bywater/Extender: RO BSO N, [place], Max Weeks in Treatment: 0 Height (in): 63 Weight (lbs): 147 Body Mass Index (BMI): 26 Nutrition Risk Screening Items Score Screening NUTRITION RISK SCREEN: I have an illness or condition that made me change the kind and/or amount of food I eat 0 No I eat fewer than two meals per day 0 No I eat few fruits and vegetables, or milk products 0 No I have three or more drinks of beer, liquor or wine almost every day 0 No I have tooth or mouth problems that make it hard for me to eat 0 No [name], Korianna [name] ([id number]) [id number] [id number].pdf Page 5 of 5 I don't always have enough money to buy the food I need 0 No I eat alone most of the time 0 No I take three or more different prescribed or over-the-counter drugs a day 1 Yes Without wanting to, I have lost or gained 10 pounds in the last six months 0 No I am not always physically able to shop, cook and/or feed myself 2 Yes Nutrition Protocols Good Risk Protocol Moderate Risk Protocol 0 Provide education on nutrition High Risk Proctocol Risk Level: Moderate Risk Score: 3 Electronic Signature(s) Signed: 11/08/2022 4:59:32 PM  By: Carlene Coria RN Entered By: Carlene Coria on 11/07/2022 13:48:13

## 2022-11-21 ENCOUNTER — Encounter: Payer: 59 | Admitting: Physician Assistant

## 2022-11-21 DIAGNOSIS — L97511 Non-pressure chronic ulcer of other part of right foot limited to breakdown of skin: Secondary | ICD-10-CM | POA: Diagnosis not present

## 2022-11-22 NOTE — Progress Notes (Signed)
Danielle, Daleigh [name] ([id number]) [id number] Page 1 of 5 Visit Report for 11/21/2022 Chief Complaint Document Details Patient Name: Date of Service: Danielle [name] 11/21/2022 3:30 PM Medical Record Number: [id number] Patient Account Number: [id number] Date of Birth/Sex: Treating RN: 09-26-1928 (89 y.o. Orvan Falconer Primary Care Provider: Denton Lank Other Clinician: Referring Provider: Treating Provider/Extender: Almedia Balls in Treatment: 2 Information Obtained from: Patient Chief Complaint Left ankle ulcer Electronic Signature(s) Signed: 11/21/2022 3:24:42 PM By: Danielle Keeler PA-C Entered By: Danielle [name] on 11/21/2022 15:24:42 -------------------------------------------------------------------------------- HPI Details Patient Name: Date of Service: Danielle Speed [name]. 11/21/2022 3:30 PM Medical Record Number: [id number] Patient Account Number: [id number] Date of Birth/Sex: Treating RN: 1929-04-13 (89 y.o. 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 Her daughter brought her in to look at an area she thought was healed on the left ankle. She was referred here from [facility]. She has been using Betadine. She went for arterial studies on 10/24/2022 for but did not see a provider. The results on the right showed a ABI of 1.02 with multiphasic waveforms and on the left at 0.71 with multiphasic waveforms. TBI on the right was 0.53 and 0.43 on the left The patient is not complaining of pain as mentioned her family felt she was healed Past medical history includes COPD, hypertension, coronary artery disease all capitals 11-21-2022 upon evaluation today patient appears to be doing well currently  in regard to her wound. In fact at first I thought she started a small opening as I actually started to debride and clean away the wound it actually appeared that she had no opening at this point. Electronic Signature(s) Danielle, Zollie [name] ([id number]) [id number] Page 2 of 5 Signed: 11/21/2022 4:09:36 PM By: Danielle Keeler PA-C Entered By: Danielle [name] on 11/21/2022 16:09:36 -------------------------------------------------------------------------------- Physical Exam Details Patient Name: Date of Service: Danielle Speed [name]. 11/21/2022 3:30 PM Medical Record Number: [id number] Patient Account Number: [id number] Date of Birth/Sex: Treating RN: 1929/03/28 (89 y.o. Orvan Falconer Primary Care Provider: Denton Lank Other Clinician: Referring Provider: Treating Provider/Extender: Almedia Balls in Treatment: 2 Constitutional Well-nourished and well-hydrated in no acute distress. Respiratory normal breathing without difficulty. Psychiatric this patient is able to make decisions and demonstrates good insight into disease process. Alert and Oriented x 3. pleasant and cooperative. Notes Again I did perform debridement clearway some of the necrotic debris however once removed the patient actually had nothing remaining open at this point. We discussed use a protective dressing for couple of weeks she will follow-up as needed I do not see any need for an immediate follow-up at this point. Electronic Signature(s) Signed: 11/21/2022 4:09:56 PM By: Danielle Keeler PA-C Entered By: Danielle [name] on 11/21/2022 16:09:56 -------------------------------------------------------------------------------- Physician Orders Details Patient Name: Date of Service: Danielle Speed [name]. 11/21/2022 3:30 PM Medical Record Number: [id number] Patient Account Number: [id number] Date of Birth/Sex: Treating RN: 1929/08/21 (89 y.o. 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 11/21/2022 3:30 PM Medical Record Number: [id number] Patient Account Number: [id number] Date of Birth/Sex: Treating RN: 17-Jul-1929 (89 y.o. Orvan Falconer Primary Care Provider: Denton Lank Other Clinician: Referring Provider: Treating Provider/Extender: Almedia Balls in Treatment: 2 Active Problems ICD-10 Encounter Code Description Active Date MDM Diagnosis I70.248 Atherosclerosis of native arteries of left leg with ulceration of other part of 11/07/2022 No Yes lower leg L97.322 Non-pressure chronic ulcer of left ankle with fat layer exposed 11/21/2022 No Yes Inactive Problems Resolved Problems Electronic Signature(s) Signed: 11/21/2022 3:24:30 PM By: Danielle Keeler PA-C Entered By: Danielle [name] on 11/21/2022 15:24:29 -------------------------------------------------------------------------------- Progress Note Details Patient Name: Date of Service: Danielle Speed [name]. 11/21/2022 3:30 PM Medical Record Number: [id number] Patient Account Number: [id number] Date of Birth/Sex: Treating RN: [birth year] (89 y.o. 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 Her daughter brought her in to look at an area she thought was healed on the left ankle. She was referred here from [facility]. She has been using Betadine. She went for arterial studies on 10/24/2022 for but did not see a provider. The results on the right showed a ABI of 1.02 with multiphasic waveforms and on the left at 0.71 with multiphasic waveforms. TBI on the right was 0.53 and 0.43 on the left The patient is not complaining of pain as mentioned her family felt she was healed Past medical history includes COPD, hypertension, coronary artery disease all capitals 11-21-2022 upon evaluation today patient appears to be doing well currently in regard to her wound. In fact at first I thought she started a small opening as I actually started to debride and clean away the wound it actually appeared that she had no opening at this point. Objective Constitutional Well-nourished and well-hydrated in no acute distress. Vitals Time Taken: 3:10 PM, Height: 63 in, Weight: 147 lbs, BMI: 26, Temperature: 97.7 F, Pulse: 71 bpm, Respiratory Rate: 18 breaths/min, Blood Pressure: 181/72 mmHg. Respiratory normal breathing without difficulty. Psychiatric this patient is able to make decisions and demonstrates good insight into disease process. Alert and Oriented x 3. pleasant and cooperative. General Notes: Again I did perform debridement clearway some of the necrotic debris however once removed the patient actually had nothing remaining open at this point. We discussed use a protective dressing for  couple of weeks she will follow-up as needed I do not see any need for an immediate follow-up at this point. Integumentary (Hair, Skin) Wound #1 status is Healed - Epithelialized. Original cause of wound was Gradually Appeared. The date acquired was: 09/02/2022. The wound has been in treatment 2 weeks. The wound is located on the Left,Medial Ankle. The wound measures 0cm length x 0cm width x 0cm depth; 0cm^2 area and 0cm^3 volume. There is no tunneling or undermining noted.  There is a none present amount of drainage noted. There is no granulation within the wound bed. There is no necrotic tissue within the wound bed. 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 I am good recommend currently that we have the patient continue to monitor for any signs of infection or worsening. Based on what I am seeing I do believe that we are headed in the right direction and in general I think that she is making some really good progress here. 2. I am also can recommend that the patient should continue to monitor for any signs of infection overall. In general I think that she is doing extremely well and hopeful she will continue to make good progress as we go forward. If she keeps this protected I think it is unlikely that is going to breakdown again. Danielle, Tashawna [name] ([id number]) [id number] Page 5 of 5 We will see patient back for reevaluation in 1 week here in the clinic. If anything worsens or changes patient will contact our office for additional recommendations. Electronic Signature(s) Signed: 11/21/2022 4:12:02 PM By: Danielle Keeler PA-C Entered By: Danielle [name] on 11/21/2022  16:12:02 -------------------------------------------------------------------------------- SuperBill Details Patient Name: Date of Service: Danielle Speed [name]. 11/21/2022 Medical Record Number: [id number] Patient Account Number: [id number] Date of Birth/Sex: Treating RN: 15-Mar-1929 (89 y.o. 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

## 2022-11-23 NOTE — Progress Notes (Signed)
[name], Shaneya R ([id number]) [id number] Page 1 of 7 Visit Report for 11/21/2022 Arrival Information Details Patient Name: Date of Service: Danielle [name] 11/21/2022 3:30 PM Medical Record Number: [id number] Patient Account Number: [id number] Date of Birth/Sex: Treating RN: [birth year] (89 y.o. Orvan Falconer Primary Care Delphin Funes: Denton Lank Other Clinician: Referring Annaelle Kasel: Treating Stamatia Masri/Extender: Almedia Balls in Treatment: 2 Visit Information History Since Last Visit Added or deleted any medications: No Patient Arrived: Wheel Chair Any new allergies or adverse reactions: No Arrival Time: 15:02 Had a fall or experienced change in No Accompanied By: daughter activities of daily living that may affect Transfer Assistance: None risk of falls: Patient Identification Verified: Yes Signs or symptoms of abuse/neglect since last visito No Secondary Verification Process Completed: Yes Hospitalized since last visit: No Patient Requires Transmission-Based Precautions: No Implantable device outside of the clinic excluding No Patient Has Alerts: No cellular tissue based products placed in the center since last visit: Has Dressing in Place as Prescribed: Yes Pain Present Now: No Electronic Signature(s) Signed: 11/22/2022 11:27:58 AM By: Carlene Coria RN Entered By: Carlene Coria on 11/21/2022 15:10:10 -------------------------------------------------------------------------------- Clinic Level of Care Assessment Details Patient Name: Date of Service: [name] 11/21/2022 3:30 PM Medical Record Number: [id number] Patient Account Number: [id number] Date of Birth/Sex: Treating RN: [birth year] (89 y.o. Orvan Falconer Primary Care Aerilynn Goin: Denton Lank Other Clinician: Referring Bram Hottel: Treating Laurel Harnden/Extender: Almedia Balls in Treatment: 2 Clinic Level of Care Assessment Items TOOL 4 Quantity  Score X- 1 0 Use when only an EandM is performed on FOLLOW-UP visit ASSESSMENTS - Nursing Assessment / Reassessment X- 1 10 Reassessment of Co-morbidities (includes updates in patient status) X- 1 5 Reassessment of Adherence to Treatment Plan ASSESSMENTS - Wound and Skin A ssessment / Reassessment X - Simple Wound Assessment / Reassessment - one wound 1 5 []  - 0 Complex Wound Assessment / Reassessment - multiple wounds []  - 0 Dermatologic / Skin Assessment (not related to wound area) ASSESSMENTS - Focused Assessment []  - 0 Circumferential Edema Measurements - multi extremities []  - 0 Nutritional Assessment / Counseling / Intervention []  - 0 Lower Extremity Assessment (monofilament, tuning fork, pulses) []  - 0 Peripheral Arterial Disease Assessment (using hand held doppler) ASSESSMENTS - Ostomy and/or Continence Assessment and Care []  - 0 Incontinence Assessment and Management []  - 0 Ostomy Care Assessment and Management (repouching, etc.) PROCESS - Coordination of Care X - Simple Patient / Family Education for ongoing care 1 15 [name], Edna R ([id number]) [phone] Page 2 of 7 []  - 0 Complex (extensive) Patient / Family Education for ongoing care []  - 0 Staff obtains Programmer, systems, Records, T Results / Process Orders est []  - 0 Staff telephones HHA, Nursing Homes / Clarify orders / etc []  - 0 Routine Transfer to another Facility (non-emergent condition) []  - 0 Routine Hospital Admission (non-emergent condition) []  - 0 New Admissions / Biomedical engineer / Ordering NPWT Apligraf, etc. , []  - 0 Emergency Hospital Admission (emergent condition) X- 1 10 Simple Discharge Coordination []  - 0 Complex (extensive) Discharge Coordination PROCESS - Special Needs []  - 0 Pediatric / Minor Patient Management []  - 0 Isolation Patient Management []  - 0 Hearing / Language / Visual special needs []  - 0 Assessment of Community assistance  (transportation, D/C planning, etc.) []  - 0 Additional assistance / Altered mentation []  - 0 Support Surface(s) Assessment (bed, cushion, seat, etc.) INTERVENTIONS - Wound Cleansing / Measurement X -  Simple Wound Cleansing - one wound 1 5 []  - 0 Complex Wound Cleansing - multiple wounds X- 1 5 Wound Imaging (photographs - any number of wounds) []  - 0 Wound Tracing (instead of photographs) X- 1 5 Simple Wound Measurement - one wound []  - 0 Complex Wound Measurement - multiple wounds INTERVENTIONS - Wound Dressings []  - 0 Small Wound Dressing one or multiple wounds []  - 0 Medium Wound Dressing one or multiple wounds []  - 0 Large Wound Dressing one or multiple wounds []  - 0 Application of Medications - topical []  - 0 Application of Medications - injection INTERVENTIONS - Miscellaneous []  - 0 External ear exam []  - 0 Specimen Collection (cultures, biopsies, blood, body fluids, etc.) []  - 0 Specimen(s) / Culture(s) sent or taken to Lab for analysis []  - 0 Patient Transfer (multiple staff / Civil Service fast streamer / Similar devices) []  - 0 Simple Staple / Suture removal (25 or less) []  - 0 Complex Staple / Suture removal (26 or more) []  - 0 Hypo / Hyperglycemic Management (close monitor of Blood Glucose) []  - 0 Ankle / Brachial Index (ABI) - do not check if billed separately X- 1 5 Vital Signs Has the patient been seen at the hospital within the last three years: Yes Total Score: 65 Level Of Care: New/Established - Level 2 Electronic Signature(s) Signed: 11/22/2022 11:27:58 AM By: Carlene Coria RN Entered By: Carlene Coria on 11/21/2022 15:38:08 [name], Bernetta R ([id number] Page 3 of 7 -------------------------------------------------------------------------------- Encounter Discharge Information Details Patient Name: Date of Service: Danielle [name] 11/21/2022 3:30 PM Medical Record Number: [id number] Patient Account Number:  [id number] Date of Birth/Sex: Treating RN: 02/20/1929 (89 y.o. Orvan Falconer Primary Care Elonzo Sopp: Denton Lank Other Clinician: Referring Geniva Lohnes: Treating Davianna Deutschman/Extender: Almedia Balls in Treatment: 2 Encounter Discharge Information Items Discharge Condition: Stable Ambulatory Status: Wheelchair Discharge Destination: Home Transportation: Private Auto Accompanied By: daughter Schedule Follow-up Appointment: Yes Clinical Summary of Care: Electronic Signature(s) Signed: 11/22/2022 11:27:58 AM By: Carlene Coria RN Entered By: Carlene Coria on 11/21/2022 15:39:01 -------------------------------------------------------------------------------- Lower Extremity Assessment Details Patient Name: Date of Service: [name] 11/21/2022 3:30 PM Medical Record Number: [id number] Patient Account Number: [id number] Date of Birth/Sex: Treating RN: 12/14/1928 (89 y.o. Orvan Falconer Primary Care Jannet Calip: Denton Lank Other Clinician: Referring Rosilyn Coachman: Treating Ozelle Brubacher/Extender: Almedia Balls in Treatment: 2 Electronic Signature(s) Signed: 11/22/2022 11:27:58 AM By: Carlene Coria RN Entered By: Carlene Coria on 11/21/2022 15:15:26 -------------------------------------------------------------------------------- Multi Wound Chart Details Patient Name: Date of Service: [name], DO [place]. 11/21/2022 3:30 PM Medical Record Number: [id number] Patient Account Number: [id number] Date of Birth/Sex: Treating RN: 1928/12/15 (89 y.o. Orvan Falconer Primary Care Adana Marik: Denton Lank Other Clinician: Referring Akesha Uresti: Treating Creg Gilmer/Extender: Almedia Balls in Treatment: 2 Vital Signs Height(in): 63 Pulse(bpm): 71 Weight(lbs): 147 Blood Pressure(mmHg): 181/72 Body Mass Index(BMI): 26 Temperature(F): 97.7 Respiratory Rate(breaths/min): 18 [1:Photos:] [N/A:N/A] [name], Keishana R ([id number]) [1:Left, Medial Ankle Wound  Location: Gradually Appeared Wounding Event: Arterial Insufficiency Ulcer Primary Etiology: Chronic Obstructive Pulmonary Comorbid History: Disease (COPD), Coronary Artery Disease, Hypertension 09/02/2022 Date Acquired: 2 Weeks  of Treatment: Healed - Epithelialized Wound Status: No Wound Recurrence: 0x0x0 Measurements L x W x D (cm) 0 A (cm) : rea 0 Volume (cm) : 100.00% % Reduction in Area: 100.00% % Reduction in Volume: Full Thickness Without Exposed Classification: Support  Structures None Present Exudate Amount: None Present (0%) Granulation Amount: None  Present (0%) Necrotic Amount: Fascia: No Exposed Structures: Fat Layer (Subcutaneous Tissue): No Tendon: No Muscle: No Joint: No Bone: No Large (67-100%)  Epithelialization:] [N/A:N/A N/A N/A N/A N/A N/A N/A N/A N/A N/A N/A N/A N/A N/A N/A N/A N/A N/A N/A] Treatment Notes Electronic Signature(s) Signed: 11/22/2022 11:27:58 AM By: Carlene Coria RN Entered By: Carlene Coria on 11/21/2022 15:37:30 -------------------------------------------------------------------------------- Multi-Disciplinary Care Plan Details Patient Name: Date of Service: [name], [name] 11/21/2022 3:30 PM Medical Record Number: [id number] Patient Account Number: [id number] Date of Birth/Sex: Treating RN: 09-04-1928 (89 y.o. Orvan Falconer Primary Care Shana Younge: Denton Lank Other Clinician: Referring Kirston Luty: Treating Brianni Manthe/Extender: Almedia Balls in Treatment: 2 Active Inactive Electronic Signature(s) Signed: 11/22/2022 11:27:58 AM By: Carlene Coria RN Entered By: Carlene Coria on 11/21/2022 15:38:29 -------------------------------------------------------------------------------- Pain Assessment Details Patient Name: Date of Service: [name] 11/21/2022 3:30 PM Medical Record Number: [id number] Patient Account Number: [id number] Date of Birth/Sex: Treating RN: [birth year] (89 y.o. Orvan Falconer Primary Care Birtie Fellman: Denton Lank  Other Clinician: Referring Keynan Heffern: Treating Blondina Coderre/Extender: Almedia Balls in Treatment: 2 Active Problems Location of Pain Severity and Description of Pain Patient Has Helyn Numbers Site Locations [name], [place] ([id number]) [id number] Page 5 of 7 Pain Management and Medication Current Pain Management: Electronic Signature(s) Signed: 11/22/2022 11:27:58 AM By: Carlene Coria RN Entered By: Carlene Coria on 11/21/2022 15:10:42 -------------------------------------------------------------------------------- Patient/Caregiver Education Details Patient Name: Date of Service: Danielle [name] 3/21/2024andnbsp3:30 PM Medical Record Number: [id number] Patient Account Number: [id number] Date of Birth/Gender: Treating RN: 1929/04/07 (89 y.o. Orvan Falconer Primary Care Physician: Denton Lank Other Clinician: Referring Physician: Treating Physician/Extender: Almedia Balls in Treatment: 2 Education Assessment Education Provided To: Patient Education Topics Provided Wound/Skin Impairment: Methods: Explain/Verbal Responses: State content correctly Electronic Signature(s) Signed: 11/22/2022 11:27:58 AM By: Carlene Coria RN Entered By: Carlene Coria on 11/21/2022 15:15:46 -------------------------------------------------------------------------------- Wound Assessment Details Patient Name: Date of Service: [name] 11/21/2022 3:30 PM Medical Record Number: [id number] Patient Account Number: [id number] Date of Birth/Sex: Treating RN: 08-10-1929 (89 y.o. Orvan Falconer Primary Care Josiyah Tozzi: Denton Lank Other Clinician: Referring Zeppelin Commisso: Treating Keyasha Miah/Extender: Almedia Balls in Treatment: 2 Wound Status Wound Number: 1 Primary Arterial Insufficiency Ulcer Etiology: Wound Location: Left, Medial Ankle Wound Healed - Epithelialized Wounding Event: Gradually Appeared Status: Date  Acquired: 09/02/2022 Comorbid Chronic Obstructive Pulmonary Disease (COPD), Coronary Weeks Of Treatment: 2 History: Artery Disease, Hypertension Clustered Wound: No [name], Verdelle R ([id number] Page 6 of 7 Photos Wound Measurements Length: (cm) Width: (cm) Depth: (cm) Area: (cm) Volume: (cm) 0 % Reduction in Area: 100% 0 % Reduction in Volume: 100% 0 Epithelialization: Large (67-100%) 0 Tunneling: No 0 Undermining: No Wound Description Classification: Full Thickness Without Exposed Support Exudate Amount: None Present Structures Foul Odor After Cleansing: No Slough/Fibrino No Wound Bed Granulation Amount: None Present (0%) Exposed Structure Necrotic Amount: None Present (0%) Fascia Exposed: No Fat Layer (Subcutaneous Tissue) Exposed: No Tendon Exposed: No Muscle Exposed: No Joint Exposed: No Bone Exposed: No Treatment Notes Wound #1 (Ankle) Wound Laterality: Left, Medial Cleanser Peri-Wound Care Topical Primary Dressing Secondary Dressing Secured With Compression Wrap Compression Stockings Add-Ons Electronic Signature(s) Signed: 11/22/2022 11:27:58 AM By: Carlene Coria RN Entered By: Carlene Coria on 11/21/2022 15:33:21 -------------------------------------------------------------------------------- Vitals Details Patient Name: Date of Service: [name], [name] 11/21/2022 3:30 PM Medical Record Number: [id number] Patient Account Number:  [id number] Date of Birth/Sex: Treating RN: [birth year] (89 y.o. Orvan Falconer Primary Care Luciel Brickman: Denton Lank Other Clinician: Referring Myer Bohlman: Treating Taquila Leys/Extender: Almedia Balls in Treatment: 2 Vital Signs [name], Michol R ([id number]) [id number] Page 7 of 7 Time Taken: 15:10 Temperature (F): 97.7 Height (in): 63 Pulse (bpm): 71 Weight (lbs): 147 Respiratory Rate (breaths/min): 18 Body Mass Index (BMI): 26 Blood Pressure  (mmHg): 181/72 Reference Range: 80 - 120 mg / dl Electronic Signature(s) Signed: 11/22/2022 11:27:58 AM By: Carlene Coria RN Entered By: Carlene Coria on 11/21/2022 15:10:36

## 2022-12-30 DIAGNOSIS — M170 Bilateral primary osteoarthritis of knee: Secondary | ICD-10-CM | POA: Diagnosis present

## 2023-02-11 ENCOUNTER — Ambulatory Visit: Payer: 59 | Admitting: Internal Medicine

## 2023-02-13 ENCOUNTER — Telehealth: Payer: Self-pay | Admitting: Physician Assistant

## 2023-02-13 ENCOUNTER — Encounter: Payer: Self-pay | Admitting: Physician Assistant

## 2023-02-13 ENCOUNTER — Ambulatory Visit (INDEPENDENT_AMBULATORY_CARE_PROVIDER_SITE_OTHER): Payer: 59 | Admitting: Physician Assistant

## 2023-02-13 VITALS — BP 130/65 | HR 62 | Temp 97.6°F | Resp 16 | Ht 63.0 in | Wt 147.0 lb

## 2023-02-13 DIAGNOSIS — G4733 Obstructive sleep apnea (adult) (pediatric): Secondary | ICD-10-CM

## 2023-02-13 DIAGNOSIS — Z7189 Other specified counseling: Secondary | ICD-10-CM | POA: Diagnosis not present

## 2023-02-13 NOTE — Progress Notes (Signed)
[facility] [address] [place], [place] [address]  Pulmonary Sleep Medicine   Office Visit Note  Patient Name: Danielle [name] DOB: [birth year] MRN [id number]  Date of Service: 02/13/2023  Complaints/HPI: Pt is here for routine pulmonary follow up with her daughter. Sleeping ok. Has not gotten on new cpap machine after last one stopped working. She was eligible for new machine in October but never called to move forward with this. Will reorder now. No breathing concerns and does not use any inhalers.  ROS  General: (-) fever, (-) chills, (-) night sweats, (-) weakness Skin: (-) rashes, (-) itching,. Eyes: (-) visual changes, (-) redness, (-) itching. Nose and Sinuses: (-) nasal stuffiness or itchiness, (-) postnasal drip, (-) nosebleeds, (-) sinus trouble. Mouth and Throat: (-) sore throat, (-) hoarseness. Neck: (-) swollen glands, (-) enlarged thyroid, (-) neck pain. Respiratory: - cough, (-) bloody sputum, - shortness of breath, - wheezing. Cardiovascular: - ankle swelling, (-) chest pain. Lymphatic: (-) lymph node enlargement. Neurologic: (-) numbness, (-) tingling. Psychiatric: (-) anxiety, (-) depression   Current Medication: Outpatient Encounter Medications as of 02/13/2023  Medication Sig   acetaminophen (TYLENOL) 500 MG tablet 1,000 mg every 8 (eight) hours as needed for moderate pain.    alum & mag hydroxide-simeth (MAALOX PLUS) 400-400-40 MG/5ML suspension Take 15 mLs by mouth as needed for indigestion.    aspirin EC 81 MG tablet Take 1 tablet by mouth daily.   cetirizine (ZYRTEC) 10 MG tablet Take 10 mg by mouth daily.   cyclobenzaprine (FLEXERIL) 5 MG tablet Take 5 mg by mouth 3 (three) times daily as needed.   diphenhydrAMINE (BENADRYL) 25 mg capsule Take 25 mg by mouth at bedtime as needed for sleep.   donepezil (ARICEPT) 5 MG tablet Take 5 mg by mouth at bedtime.   doxycycline (VIBRA-TABS) 100 MG tablet Take 1 tablet (100 mg total) by mouth 2 (two)  times daily.   fluticasone (FLONASE) 50 MCG/ACT nasal spray Place 1 spray into both nostrils daily.   hydrochlorothiazide (HYDRODIURIL) 25 MG tablet Take 25 mg by mouth daily.   isosorbide mononitrate (IMDUR) 60 MG 24 hr tablet Take 60 mg by mouth daily.   levothyroxine (SYNTHROID) 200 MCG tablet Take 200 mcg by mouth daily before breakfast.    mometasone (ELOCON) 0.1 % cream Apply topically to aa of rash at body bid m - f weekly use only when flared. D/c when rash is clear   naproxen sodium (ALEVE) 220 MG tablet Take 220 mg by mouth 2 (two) times daily as needed (pain).   nitroGLYCERIN (NITROSTAT) 0.4 MG SL tablet Place 0.4 mg under the tongue every 5 (five) minutes as needed for chest pain.    ondansetron (ZOFRAN ODT) 4 MG disintegrating tablet Take 1 tablet (4 mg total) by mouth every 8 (eight) hours as needed for nausea or vomiting.   polyethylene glycol powder (GLYCOLAX/MIRALAX) 17 GM/SCOOP powder Take 17 g by mouth daily. Can increase to up to 51 grams daily, goal 1-2 soft formed bowel movements daily   potassium chloride (K-DUR,KLOR-CON) 10 MEQ tablet Take 10 mEq by mouth 2 (two) times daily.    senna (SENOKOT) 8.6 MG TABS tablet Take 1 tablet (8.6 mg total) by mouth daily.   sertraline (ZOLOFT) 100 MG tablet Take 100 mg by mouth daily.    No facility-administered encounter medications on file as of 02/13/2023.    Surgical History: Past Surgical History:  Procedure Laterality Date   ABDOMINAL HYSTERECTOMY  APPENDECTOMY     CATARACT EXTRACTION W/PHACO Left 03/09/2020   Procedure: CATARACT EXTRACTION PHACO AND INTRAOCULAR LENS PLACEMENT (IOC) LEFT;  Surgeon: Lockie Mola, MD;  Location: [facility] ORS;  Service: Ophthalmology;  Laterality: Left;  cde 9.56 us1:32.8 ap10.3%   EYE SURGERY Right    cataract extraction    Medical History: Past Medical History:  Diagnosis Date   Anxiety    Arthritis    Depression    Dyspnea    with exertion   Headache    migraines   History  of myocarditis    Hyperlipemia    Hypertension    Hypocholesterolemia    Hypothyroidism    Myocardial infarction (HCC) 2010   Sleep apnea    uses a cpap    Family History: Family History  Problem Relation Age of Onset   Bladder Cancer Neg Hx    Kidney cancer Neg Hx    Breast cancer Neg Hx     Social History: Social History   Socioeconomic History   Marital status: Widowed    Spouse name: Not on file   Number of children: Not on file   Years of education: Not on file   Highest education level: Not on file  Occupational History   Not on file  Tobacco Use   Smoking status: Never   Smokeless tobacco: Never  Vaping Use   Vaping Use: Never used  Substance and Sexual Activity   Alcohol use: No   Drug use: No   Sexual activity: Not Currently  Other Topics Concern   Not on file  Social History Narrative   Not on file   Social Determinants of Health   Financial Resource Strain: Not on file  Food Insecurity: Not on file  Transportation Needs: Not on file  Physical Activity: Not on file  Stress: Not on file  Social Connections: Not on file  Intimate Partner Violence: Not on file    Vital Signs: Blood pressure 130/65, pulse 62, temperature 97.6 F (36.4 C), resp. rate 16, height 5\' 3"  (1.6 m), weight 147 lb (66.7 kg), SpO2 99 %.  Examination: General Appearance: The patient is well-developed, well-nourished, and in no distress. Skin: Gross inspection of skin unremarkable. Head: normocephalic, no gross deformities. Eyes: no gross deformities noted. ENT: ears appear grossly normal no exudates. Neck: Supple. No thyromegaly. No LAD. Respiratory: Lungs clear to auscultation. Cardiovascular: Normal S1 and S2 without murmur or rub. Extremities: No cyanosis. pulses are equal. Neurologic: Alert and oriented. No involuntary movements.  LABS: No results found for this or any previous visit (from the past 2160 hour(s)).  Radiology: DG Abd 2 Views  Result Date:  02/26/2021 CLINICAL DATA:  Question impaction or obstruction EXAM: ABDOMEN - 2 VIEW COMPARISON:  CT 08/29/2017 FINDINGS: No free air beneath the diaphragm. Nonobstructed gas pattern with moderate stool in the rectum. No radiopaque calculi. Advanced degenerative changes of both hips. IMPRESSION: Nonobstructed gas pattern Electronically Signed   By: Jasmine Pang M.D.   On: 02/26/2021 18:33    No results found.  No results found.    Assessment and Plan: Patient Active Problem List   Diagnosis Date Noted   Constipation 02/26/2021   Hypothyroidism, unspecified 12/13/2020   Chronic obstructive pulmonary disease (HCC) 09/02/2020   Pain due to onychomycosis of toenails of both feet 02/15/2019   Urge incontinence of urine 07/03/2017   Coronary arteriosclerosis in native artery 05/29/2014   CAD (coronary artery disease), native coronary artery 05/29/2014   Hyperlipidemia 05/29/2014  Hypertension 05/29/2014   Hyperlipidemia, unspecified 05/29/2014   Pain in extremity 06/21/2013   Onychomycosis due to dermatophyte 06/21/2013    1. OSA on CPAP Will order replacement unit - For home use only DME continuous positive airway pressure (CPAP)  2. CPAP use counseling CPAP couseling-Discussed importance of adequate CPAP use as well as proper care and cleaning techniques of machine and all supplies.    General Counseling: I have discussed the findings of the evaluation and examination with Keyara.  I have also discussed any further diagnostic evaluation thatmay be needed or ordered today. Creta verbalizes understanding of the findings of todays visit. We also reviewed her medications today and discussed drug interactions and side effects including but not limited excessive drowsiness and altered mental states. We also discussed that there is always a risk not just to her but also people around her. she has been encouraged to call the office with any questions or concerns that should arise related  to todays visit.  Orders Placed This Encounter  Procedures   For home use only DME continuous positive airway pressure (CPAP)    AHP, needs replacement machine    Order Specific Question:   Length of Need    Answer:   Lifetime    Order Specific Question:   Patient has OSA or probable OSA    Answer:   Yes    Order Specific Question:   Is the patient currently using CPAP in the home    Answer:   Yes    Order Specific Question:   Settings    Answer:   Other see comments    Order Specific Question:   CPAP supplies needed    Answer:   Mask, headgear, cushions, filters, heated tubing and water chamber     Time spent: 30  I have personally obtained a history, examined the patient, evaluated laboratory and imaging results, formulated the assessment and plan and placed orders. This patient was seen by Lynn Ito, PA-C in collaboration with Dr. Freda Munro as a part of collaborative care agreement.     Yevonne Pax, MD [facility] Pulmonary and Critical Care Sleep medicine

## 2023-02-13 NOTE — Telephone Encounter (Signed)
Notified Beth [facility] Sarah w/ [facility] of cpap replacement order-Toni

## 2023-06-03 ENCOUNTER — Encounter: Payer: Self-pay | Admitting: Internal Medicine

## 2023-06-03 ENCOUNTER — Ambulatory Visit: Payer: 59 | Admitting: Internal Medicine

## 2023-06-03 VITALS — BP 128/60 | HR 65 | Temp 98.5°F | Resp 16 | Ht 63.0 in

## 2023-06-03 DIAGNOSIS — G4733 Obstructive sleep apnea (adult) (pediatric): Secondary | ICD-10-CM | POA: Diagnosis not present

## 2023-06-03 DIAGNOSIS — R634 Abnormal weight loss: Secondary | ICD-10-CM | POA: Diagnosis not present

## 2023-06-03 DIAGNOSIS — J4489 Other specified chronic obstructive pulmonary disease: Secondary | ICD-10-CM | POA: Diagnosis not present

## 2023-06-03 NOTE — Progress Notes (Signed)
[facility] [address] [place], [place] [address]  Pulmonary Sleep Medicine   Office Visit Note  Patient Name: Danielle [name] DOB: 26-Oct-1928 MRN [id number]  Date of Service: 06/03/2023  Complaints/HPI: She has noted that she is not eating well. Patient has been taking 4 cans of ensure daily. She does not eat much food but daughter states maybe 10% for each meal. She has been doing well with her breathing. She has a CPAP because it does help her and she feels better  Office Spirometry Results:     ROS  General: (-) fever, (-) chills, (-) night sweats, (-) weakness Skin: (-) rashes, (-) itching,. Eyes: (-) visual changes, (-) redness, (-) itching. Nose and Sinuses: (-) nasal stuffiness or itchiness, (-) postnasal drip, (-) nosebleeds, (-) sinus trouble. Mouth and Throat: (-) sore throat, (-) hoarseness. Neck: (-) swollen glands, (-) enlarged thyroid, (-) neck pain. Respiratory: - cough, (-) bloody sputum, - shortness of breath, - wheezing. Cardiovascular: - ankle swelling, (-) chest pain. Lymphatic: (-) lymph node enlargement. Neurologic: (-) numbness, (-) tingling. Psychiatric: (-) anxiety, (-) depression   Current Medication: Outpatient Encounter Medications as of 06/03/2023  Medication Sig   acetaminophen (TYLENOL) 500 MG tablet 1,000 mg every 8 (eight) hours as needed for moderate pain.    alum & mag hydroxide-simeth (MAALOX PLUS) 400-400-40 MG/5ML suspension Take 15 mLs by mouth as needed for indigestion.    aspirin EC 81 MG tablet Take 1 tablet by mouth daily.   cetirizine (ZYRTEC) 10 MG tablet Take 10 mg by mouth daily.   cyclobenzaprine (FLEXERIL) 5 MG tablet Take 5 mg by mouth 3 (three) times daily as needed.   diphenhydrAMINE (BENADRYL) 25 mg capsule Take 25 mg by mouth at bedtime as needed for sleep.   donepezil (ARICEPT) 5 MG tablet Take 5 mg by mouth at bedtime.   doxycycline (VIBRA-TABS) 100 MG tablet Take 1 tablet (100 mg total) by mouth 2  (two) times daily.   fluticasone (FLONASE) 50 MCG/ACT nasal spray Place 1 spray into both nostrils daily.   hydrochlorothiazide (HYDRODIURIL) 25 MG tablet Take 25 mg by mouth daily.   isosorbide mononitrate (IMDUR) 60 MG 24 hr tablet Take 60 mg by mouth daily.   levothyroxine (SYNTHROID) 200 MCG tablet Take 200 mcg by mouth daily before breakfast.    mometasone (ELOCON) 0.1 % cream Apply topically to aa of rash at body bid m - f weekly use only when flared. D/c when rash is clear   naproxen sodium (ALEVE) 220 MG tablet Take 220 mg by mouth 2 (two) times daily as needed (pain).   nitroGLYCERIN (NITROSTAT) 0.4 MG SL tablet Place 0.4 mg under the tongue every 5 (five) minutes as needed for chest pain.    ondansetron (ZOFRAN ODT) 4 MG disintegrating tablet Take 1 tablet (4 mg total) by mouth every 8 (eight) hours as needed for nausea or vomiting.   polyethylene glycol powder (GLYCOLAX/MIRALAX) 17 GM/SCOOP powder Take 17 g by mouth daily. Can increase to up to 51 grams daily, goal 1-2 soft formed bowel movements daily   potassium chloride (K-DUR,KLOR-CON) 10 MEQ tablet Take 10 mEq by mouth 2 (two) times daily.    senna (SENOKOT) 8.6 MG TABS tablet Take 1 tablet (8.6 mg total) by mouth daily.   sertraline (ZOLOFT) 100 MG tablet Take 100 mg by mouth daily.    No facility-administered encounter medications on file as of 06/03/2023.    Surgical History: Past Surgical History:  Procedure Laterality  Date   ABDOMINAL HYSTERECTOMY     APPENDECTOMY     CATARACT EXTRACTION W/PHACO Left 03/09/2020   Procedure: CATARACT EXTRACTION PHACO AND INTRAOCULAR LENS PLACEMENT (IOC) LEFT;  Surgeon: Lockie Mola, MD;  Location: [facility] ORS;  Service: Ophthalmology;  Laterality: Left;  cde 9.56 us1:32.8 ap10.3%   EYE SURGERY Right    cataract extraction    Medical History: Past Medical History:  Diagnosis Date   Anxiety    Arthritis    Depression    Dyspnea    with exertion   Headache    migraines    History of myocarditis    Hyperlipemia    Hypertension    Hypocholesterolemia    Hypothyroidism    Myocardial infarction (HCC) 2010   Sleep apnea    uses a cpap    Family History: Family History  Problem Relation Age of Onset   Bladder Cancer Neg Hx    Kidney cancer Neg Hx    Breast cancer Neg Hx     Social History: Social History   Socioeconomic History   Marital status: Widowed    Spouse name: Not on file   Number of children: Not on file   Years of education: Not on file   Highest education level: Not on file  Occupational History   Not on file  Tobacco Use   Smoking status: Never   Smokeless tobacco: Never  Vaping Use   Vaping status: Never Used  Substance and Sexual Activity   Alcohol use: No   Drug use: No   Sexual activity: Not Currently  Other Topics Concern   Not on file  Social History Narrative   Not on file   Social Determinants of Health   Financial Resource Strain: Not on file  Food Insecurity: Not on file  Transportation Needs: Not on file  Physical Activity: Not on file  Stress: Not on file  Social Connections: Not on file  Intimate Partner Violence: Not on file    Vital Signs: Blood pressure 128/60, pulse 65, temperature 98.5 F (36.9 C), resp. rate 16, height 5\' 3"  (1.6 m), SpO2 99%.  Examination: General Appearance: The patient is well-developed, well-nourished, and in no distress. Skin: Gross inspection of skin unremarkable. Head: normocephalic, no gross deformities. Eyes: no gross deformities noted. ENT: ears appear grossly normal no exudates. Neck: Supple. No thyromegaly. No LAD. Respiratory: no rhonchi noted. Cardiovascular: Normal S1 and S2 without murmur or rub. Extremities: No cyanosis. pulses are equal. Neurologic: Alert and oriented. No involuntary movements.  LABS: No results found for this or any previous visit (from the past 2160 hour(s)).  Radiology: DG Abd 2 Views  Result Date: 02/26/2021 CLINICAL DATA:   Question impaction or obstruction EXAM: ABDOMEN - 2 VIEW COMPARISON:  CT 08/29/2017 FINDINGS: No free air beneath the diaphragm. Nonobstructed gas pattern with moderate stool in the rectum. No radiopaque calculi. Advanced degenerative changes of both hips. IMPRESSION: Nonobstructed gas pattern Electronically Signed   By: Jasmine Pang M.D.   On: 02/26/2021 18:33    No results found.  No results found.  Assessment and Plan: Patient Active Problem List   Diagnosis Date Noted   Constipation 02/26/2021   Hypothyroidism, unspecified 12/13/2020   Chronic obstructive pulmonary disease (HCC) 09/02/2020   Pain due to onychomycosis of toenails of both feet 02/15/2019   Urge incontinence of urine 07/03/2017   Coronary arteriosclerosis in native artery 05/29/2014   CAD (coronary artery disease), native coronary artery 05/29/2014   Hyperlipidemia 05/29/2014  Hypertension 05/29/2014   Hyperlipidemia, unspecified 05/29/2014   Pain in extremity 06/21/2013   Onychomycosis due to dermatophyte 06/21/2013    1. OSA on CPAP On CPAP therapy.  She wants to continue using the CPAP which is fine she actually states that it does help her sleep better and she feels better during the daytime.  2. Obstructive chronic bronchitis without exacerbation (HCC) Under control we will continue with inhalers and nebulizers.  3. Weight loss, non-intentional She is not really eating well she has been using a lot of supplements and I think this is probably causing her appetite to be lower than expected.  I have encouraged for the daughter to give her foods and try cutting back on the supplements if she is eating well  General Counseling: I have discussed the findings of the evaluation and examination with Eveline.  I have also discussed any further diagnostic evaluation thatmay be needed or ordered today. Mariaeduarda verbalizes understanding of the findings of todays visit. We also reviewed her medications today and discussed  drug interactions and side effects including but not limited excessive drowsiness and altered mental states. We also discussed that there is always a risk not just to her but also people around her. she has been encouraged to call the office with any questions or concerns that should arise related to todays visit.  No orders of the defined types were placed in this encounter.    Time spent: 78  I have personally obtained a history, examined the patient, evaluated laboratory and imaging results, formulated the assessment and plan and placed orders.    Yevonne Pax, MD [facility] Pulmonary and Critical Care Sleep medicine

## 2023-06-03 NOTE — Patient Instructions (Signed)

## 2023-06-12 ENCOUNTER — Ambulatory Visit: Payer: 59 | Admitting: Physician Assistant

## 2023-07-23 ENCOUNTER — Telehealth: Payer: Self-pay | Admitting: Physician Assistant

## 2023-07-23 NOTE — Telephone Encounter (Signed)
Received cpap/bipap supply order from [facility]. Gave to [employer]

## 2023-07-24 ENCOUNTER — Telehealth: Payer: Self-pay | Admitting: Physician Assistant

## 2023-07-24 NOTE — Telephone Encounter (Signed)
Cpap/bipap supply order faxed back to [facility]; [phone]. Scanned-Toni

## 2023-08-06 ENCOUNTER — Ambulatory Visit: Payer: 59

## 2023-08-12 ENCOUNTER — Ambulatory Visit: Payer: 59 | Admitting: Internal Medicine

## 2023-09-07 ENCOUNTER — Inpatient Hospital Stay
Admission: EM | Admit: 2023-09-07 | Discharge: 2023-09-30 | DRG: 592 | Disposition: A | Discharge status: Home Health | Payer: 59 | Attending: Internal Medicine | Admitting: Internal Medicine

## 2023-09-07 ENCOUNTER — Emergency Department: Payer: 59

## 2023-09-07 ENCOUNTER — Other Ambulatory Visit: Payer: Self-pay

## 2023-09-07 DIAGNOSIS — R532 Functional quadriplegia: Secondary | ICD-10-CM | POA: Diagnosis present

## 2023-09-07 DIAGNOSIS — E785 Hyperlipidemia, unspecified: Secondary | ICD-10-CM | POA: Diagnosis present

## 2023-09-07 DIAGNOSIS — I10 Essential (primary) hypertension: Secondary | ICD-10-CM | POA: Diagnosis present

## 2023-09-07 DIAGNOSIS — G9341 Metabolic encephalopathy: Principal | ICD-10-CM | POA: Diagnosis present

## 2023-09-07 DIAGNOSIS — D62 Acute posthemorrhagic anemia: Secondary | ICD-10-CM | POA: Diagnosis not present

## 2023-09-07 DIAGNOSIS — D638 Anemia in other chronic diseases classified elsewhere: Secondary | ICD-10-CM | POA: Diagnosis present

## 2023-09-07 DIAGNOSIS — L02416 Cutaneous abscess of left lower limb: Secondary | ICD-10-CM | POA: Diagnosis present

## 2023-09-07 DIAGNOSIS — D649 Anemia, unspecified: Secondary | ICD-10-CM

## 2023-09-07 DIAGNOSIS — E8721 Acute metabolic acidosis: Secondary | ICD-10-CM | POA: Diagnosis present

## 2023-09-07 DIAGNOSIS — I2510 Atherosclerotic heart disease of native coronary artery without angina pectoris: Secondary | ICD-10-CM | POA: Diagnosis present

## 2023-09-07 DIAGNOSIS — L89323 Pressure ulcer of left buttock, stage 3: Secondary | ICD-10-CM | POA: Diagnosis not present

## 2023-09-07 DIAGNOSIS — E43 Unspecified severe protein-calorie malnutrition: Secondary | ICD-10-CM | POA: Diagnosis present

## 2023-09-07 DIAGNOSIS — F32A Depression, unspecified: Secondary | ICD-10-CM | POA: Diagnosis present

## 2023-09-07 DIAGNOSIS — I252 Old myocardial infarction: Secondary | ICD-10-CM

## 2023-09-07 DIAGNOSIS — Z7982 Long term (current) use of aspirin: Secondary | ICD-10-CM

## 2023-09-07 DIAGNOSIS — E876 Hypokalemia: Secondary | ICD-10-CM | POA: Diagnosis present

## 2023-09-07 DIAGNOSIS — B952 Enterococcus as the cause of diseases classified elsewhere: Secondary | ICD-10-CM | POA: Diagnosis present

## 2023-09-07 DIAGNOSIS — L089 Local infection of the skin and subcutaneous tissue, unspecified: Secondary | ICD-10-CM | POA: Diagnosis present

## 2023-09-07 DIAGNOSIS — M170 Bilateral primary osteoarthritis of knee: Secondary | ICD-10-CM | POA: Diagnosis present

## 2023-09-07 DIAGNOSIS — Z79890 Hormone replacement therapy (postmenopausal): Secondary | ICD-10-CM

## 2023-09-07 DIAGNOSIS — Z7401 Bed confinement status: Secondary | ICD-10-CM

## 2023-09-07 DIAGNOSIS — G8929 Other chronic pain: Secondary | ICD-10-CM | POA: Diagnosis present

## 2023-09-07 DIAGNOSIS — N179 Acute kidney failure, unspecified: Secondary | ICD-10-CM | POA: Diagnosis present

## 2023-09-07 DIAGNOSIS — Z515 Encounter for palliative care: Secondary | ICD-10-CM

## 2023-09-07 DIAGNOSIS — G4733 Obstructive sleep apnea (adult) (pediatric): Secondary | ICD-10-CM | POA: Diagnosis present

## 2023-09-07 DIAGNOSIS — E860 Dehydration: Secondary | ICD-10-CM | POA: Diagnosis present

## 2023-09-07 DIAGNOSIS — J449 Chronic obstructive pulmonary disease, unspecified: Secondary | ICD-10-CM | POA: Diagnosis present

## 2023-09-07 DIAGNOSIS — B954 Other streptococcus as the cause of diseases classified elsewhere: Secondary | ICD-10-CM | POA: Diagnosis present

## 2023-09-07 DIAGNOSIS — Z880 Allergy status to penicillin: Secondary | ICD-10-CM

## 2023-09-07 DIAGNOSIS — Z6826 Body mass index (BMI) 26.0-26.9, adult: Secondary | ICD-10-CM

## 2023-09-07 DIAGNOSIS — L89154 Pressure ulcer of sacral region, stage 4: Secondary | ICD-10-CM | POA: Diagnosis present

## 2023-09-07 DIAGNOSIS — R54 Age-related physical debility: Secondary | ICD-10-CM

## 2023-09-07 DIAGNOSIS — S31000A Unspecified open wound of lower back and pelvis without penetration into retroperitoneum, initial encounter: Secondary | ICD-10-CM

## 2023-09-07 DIAGNOSIS — L0231 Cutaneous abscess of buttock: Secondary | ICD-10-CM | POA: Diagnosis present

## 2023-09-07 DIAGNOSIS — E039 Hypothyroidism, unspecified: Secondary | ICD-10-CM | POA: Diagnosis present

## 2023-09-07 DIAGNOSIS — Z79899 Other long term (current) drug therapy: Secondary | ICD-10-CM

## 2023-09-07 DIAGNOSIS — L8990 Pressure ulcer of unspecified site, unspecified stage: Secondary | ICD-10-CM | POA: Diagnosis present

## 2023-09-07 DIAGNOSIS — Z6841 Body Mass Index (BMI) 40.0 and over, adult: Secondary | ICD-10-CM

## 2023-09-07 DIAGNOSIS — B9620 Unspecified Escherichia coli [E. coli] as the cause of diseases classified elsewhere: Secondary | ICD-10-CM | POA: Diagnosis present

## 2023-09-07 DIAGNOSIS — Z66 Do not resuscitate: Secondary | ICD-10-CM | POA: Diagnosis present

## 2023-09-07 DIAGNOSIS — E872 Acidosis, unspecified: Secondary | ICD-10-CM | POA: Diagnosis present

## 2023-09-07 LAB — CBC
HCT: 25.4 % — ABNORMAL LOW (ref 36.0–46.0)
Hemoglobin: 8.6 g/dL — ABNORMAL LOW (ref 12.0–15.0)
MCH: 31.9 pg (ref 26.0–34.0)
MCHC: 33.9 g/dL (ref 30.0–36.0)
MCV: 94.1 fL (ref 80.0–100.0)
Platelets: 198 10*3/uL (ref 150–400)
RBC: 2.7 MIL/uL — ABNORMAL LOW (ref 3.87–5.11)
RDW: 16 % — ABNORMAL HIGH (ref 11.5–15.5)
WBC: 9.6 10*3/uL (ref 4.0–10.5)
nRBC: 0 % (ref 0.0–0.2)

## 2023-09-07 LAB — URINALYSIS, ROUTINE W REFLEX MICROSCOPIC
Bilirubin Urine: NEGATIVE
Glucose, UA: NEGATIVE mg/dL
Hgb urine dipstick: NEGATIVE
Ketones, ur: 5 mg/dL — AB
Leukocytes,Ua: NEGATIVE
Nitrite: NEGATIVE
Protein, ur: NEGATIVE mg/dL
Specific Gravity, Urine: 1.018 (ref 1.005–1.030)
pH: 5 (ref 5.0–8.0)

## 2023-09-07 LAB — RESP PANEL BY RT-PCR (RSV, FLU A&B, COVID)  RVPGX2
Influenza A by PCR: NEGATIVE
Influenza B by PCR: NEGATIVE
Resp Syncytial Virus by PCR: NEGATIVE
SARS Coronavirus 2 by RT PCR: NEGATIVE

## 2023-09-07 LAB — BASIC METABOLIC PANEL
Anion gap: 13 (ref 5–15)
BUN: 38 mg/dL — ABNORMAL HIGH (ref 8–23)
CO2: 19 mmol/L — ABNORMAL LOW (ref 22–32)
Calcium: 9.7 mg/dL (ref 8.9–10.3)
Chloride: 103 mmol/L (ref 98–111)
Creatinine, Ser: 1.44 mg/dL — ABNORMAL HIGH (ref 0.44–1.00)
GFR, Estimated: 34 mL/min — ABNORMAL LOW (ref >60)
Glucose, Bld: 90 mg/dL (ref 70–99)
Potassium: 4.9 mmol/L (ref 3.5–5.1)
Sodium: 135 mmol/L (ref 135–145)

## 2023-09-07 LAB — LACTIC ACID, PLASMA: Lactic Acid, Venous: 1.5 mmol/L (ref 0.5–1.9)

## 2023-09-07 LAB — APTT: aPTT: 30 s (ref 24–36)

## 2023-09-07 LAB — PROTIME-INR
INR: 1.3 — ABNORMAL HIGH (ref 0.8–1.2)
Prothrombin Time: 16.3 s — ABNORMAL HIGH (ref 11.4–15.2)

## 2023-09-07 MED ORDER — LACTATED RINGERS IV BOLUS (SEPSIS)
1000.0000 mL | Freq: Once | INTRAVENOUS | Status: AC
  Administered 2023-09-07: 1000 mL via INTRAVENOUS

## 2023-09-07 MED ORDER — VANCOMYCIN HCL IN DEXTROSE 1-5 GM/200ML-% IV SOLN
1000.0000 mg | Freq: Once | INTRAVENOUS | Status: AC
  Administered 2023-09-07: 1000 mg via INTRAVENOUS
  Filled 2023-09-07: qty 200

## 2023-09-07 MED ORDER — LACTATED RINGERS IV SOLN: INTRAVENOUS | Status: AC

## 2023-09-07 MED ORDER — LACTATED RINGERS IV BOLUS (SEPSIS)
1000.0000 mL | Freq: Once | INTRAVENOUS | Status: AC
  Administered 2023-09-08: 1000 mL via INTRAVENOUS

## 2023-09-07 MED ORDER — SODIUM CHLORIDE 0.9 % IV SOLN
2.0000 g | Freq: Once | INTRAVENOUS | Status: AC
  Administered 2023-09-07: 2 g via INTRAVENOUS
  Filled 2023-09-07: qty 20

## 2023-09-07 MED ORDER — LACTATED RINGERS IV BOLUS (SEPSIS)
250.0000 mL | Freq: Once | INTRAVENOUS | Status: AC
  Administered 2023-09-08: 250 mL via INTRAVENOUS

## 2023-09-07 NOTE — ED Provider Triage Note (Signed)
 Emergency Medicine Provider Triage Evaluation Note  Danielle [name] , [name] 89 y.o. female  was evaluated in triage.  Pt complains of weakness and body aches x 1 week. Denies fever, SOB and chest pain.  Review of Systems  Positive:  Negative:   Physical Exam  BP (!) 128/45 (BP Location: Left Arm)   Pulse 66   Temp 98.4 F (36.9 C) (Axillary)   Resp 16   Ht 5' 3 (1.6 m)   Wt 66.7 kg   SpO2 99%   BMI 26.04 kg/m  Gen:   Awake, no distress   Resp:  Normal effort LCTAB MSK:   Moves extremities without difficulty  Other:    Medical Decision Making  Medically screening exam initiated at 7:19 PM.  Appropriate orders placed.  Danielle [name] was informed that the remainder of the evaluation will be completed by another provider, this initial triage assessment does not replace that evaluation, and the importance of remaining in the ED until their evaluation is complete.    Danielle Monte A, PA-C 09/07/23 1946

## 2023-09-07 NOTE — Sepsis Progress Note (Signed)
 Elink following code sepsis

## 2023-09-07 NOTE — ED Provider Notes (Signed)
 [facility] Provider Note   Event Date/Time   First MD Initiated Contact with Patient 09/07/23 2200     (approximate) History  Weakness  HPI Hasana Alcorta Riedinger is a 89 y.o. female with a past medical history of hypothyroidism, hypertension, anxiety/depression, and CAD who presents with her primary caregiver via EMS with complaints of ongoing weakness, body aches, malaise, and altered mental status over the past week.  There is no cough no fever and no urinary symptoms the patient is complaining of however patient's caregiver states that she is confused over the last 24 hours.  Patient does have a chronic sacral wound that has been smelling worse than usual ROS: Unable to assess   Physical Exam  Triage Vital Signs: ED Triage Vitals  Encounter Vitals Group     BP 09/07/23 1907 (!) 128/45     Systolic BP Percentile --      Diastolic BP Percentile --      Pulse Rate 09/07/23 1907 66     Resp 09/07/23 1907 16     Temp 09/07/23 1907 98.4 F (36.9 C)     Temp Source 09/07/23 1907 Axillary     SpO2 09/07/23 1907 99 %     Weight 09/07/23 1913 147 lb (66.7 kg)     Height 09/07/23 1913 5' 3 (1.6 m)     Head Circumference --      Peak Flow --      Pain Score --      Pain Loc --      Pain Education --      Exclude from Growth Chart --    Most recent vital signs: Vitals:   09/07/23 1907  BP: (!) 128/45  Pulse: 66  Resp: 16  Temp: 98.4 F (36.9 C)  SpO2: 99%   General: Awake, cooperative CV:  Good peripheral perfusion.  Resp:  Normal effort.  Abd:  No distention.  Other:  Elderly overweight African-American female resting comfortably in no acute distress.  There is a 3 cm x 2 cm x 1 cm ulceration to the left buttock with foul-smelling purulent material draining actively ED Results / Procedures / Treatments  Labs (all labs ordered are listed, but only abnormal results are displayed) Labs Reviewed  BASIC METABOLIC PANEL - Abnormal; Notable for the  following components:      Result Value   CO2 19 (*)    BUN 38 (*)    Creatinine, Ser 1.44 (*)    GFR, Estimated 34 (*)    All other components within normal limits  CBC - Abnormal; Notable for the following components:   RBC 2.70 (*)    Hemoglobin 8.6 (*)    HCT 25.4 (*)    RDW 16.0 (*)    All other components within normal limits  RESP PANEL BY RT-PCR (RSV, FLU A&B, COVID)  [id number]  URINALYSIS, ROUTINE W REFLEX MICROSCOPIC  CBG MONITORING, ED   EKG ED ECG REPORT I, Artist MARLA Kerns, the attending physician, personally viewed and interpreted this ECG. Date: 09/07/2023 EKG Time: 1917 Rate: 69 Rhythm: normal sinus rhythm QRS Axis: normal Intervals: normal ST/T Wave abnormalities: normal Narrative Interpretation: no evidence of acute ischemia RADIOLOGY ED MD interpretation: Pending -Agree with radiology assessment Official radiology report(s): No results found. PROCEDURES: Critical Care performed: No Procedures MEDICATIONS ORDERED IN ED: Medications - No data to display IMPRESSION / MDM / ASSESSMENT AND PLAN / ED COURSE  I reviewed the triage vital signs and the  nursing notes.                             The patient is on the cardiac monitor to evaluate for evidence of arrhythmia and/or significant heart rate changes. Patient's presentation is most consistent with acute presentation with potential threat to life or bodily function. The Pt presents with altered mental status and increased weakness highly concerning for sepsis (suspected skin and soft tissue source). At this time, the Pt is satting well on room air, normotensive, and appears HDS.  Will start empiric antibiotics and fluids.  Due to hypotension, will administer fluids gradually with frequent reassessment. Have low suspicion for a GI, skin/soft tissue, or CNS source at this time, but will reconsider if initial workup is unremarkable.  - CBC, BMP, LFTs - VBG - UA - BCx x2, Lactate - EKG - CXR - CT head -  Empiric Abx: Rocephin , vancomycin  - Fluids: 30cc/kgLR  Care of this patient will be signed out to the oncoming physician at the end of my shift.  All pertinent patient information conveyed and all questions answered.  All further care and disposition decisions will be made by the oncoming physician. Clinical Course as of 09/09/23 2247  Mon Sep 08, 2023  0011 Lipase and hepatic function panel are within normal limits [CF]  0025 CT Head Wo Contrast I viewed and interpreted the patient's head CT and I see no evidence of acute intracranial hemorrhage or other acute abnormality. [CF]  0025 Consulting the hospitalist team for admission based on the description listed above and in Dr. Annmarie note.  I will also update the patient and any family that may be present [CF]  0131 (Delayed documentation) I consulted in person with Dr. Cleatus with the hospitalist service.  She will admit the patient. [CF]    Clinical Course User Index [CF] Gordan Huxley, MD   FINAL CLINICAL IMPRESSION(S) / ED DIAGNOSES   Final diagnoses:  None   Rx / DC Orders   ED Discharge Orders     None      Note:  This document was prepared using Dragon voice recognition software and may include unintentional dictation errors.   Zaelyn Barbary K, MD 09/09/23 2250

## 2023-09-07 NOTE — ED Notes (Signed)
 Wet to dry dressing change on sacral wound. New dressing, clean, dry and intact.

## 2023-09-07 NOTE — Progress Notes (Signed)
 CODE SEPSIS - PHARMACY COMMUNICATION  **Broad Spectrum Antibiotics should be administered within 1 hour of Sepsis diagnosis**  Time Code Sepsis Called/Page Received: 1/5 @ 2240  Antibiotics Ordered: Ceftriaxone , Vancomycin   Time of 1st antibiotic administration: Ceftriaxone  2 gm IV X 1 on 1/5 @ 2245  Additional action taken by pharmacy:   If necessary, Name of Provider/Nurse Contacted:     Roxie Gueye D ,PharmD Clinical Pharmacist  09/07/2023  11:17 PM

## 2023-09-07 NOTE — ED Provider Notes (Signed)
-----------------------------------------   11:45 PM on 09/07/2023 -----------------------------------------  Assuming care from Dr. Jossie.  In short, Danielle [name] is a 89 y.o. female with a chief complaint of altered mental status.  Refer to the original H&P for additional details.  The current plan of care is to follow up on hepatic function panel and head CT.  Anticipate admission for metabolic encephalopathy in the setting of acute on chronic sacral wound infection.   Clinical Course as of 09/08/23 0131  Mon Sep 08, 2023  0011 Lipase and hepatic function panel are within normal limits [CF]  0025 CT Head Wo Contrast I viewed and interpreted the patient's head CT and I see no evidence of acute intracranial hemorrhage or other acute abnormality. [CF]  0025 Consulting the hospitalist team for admission based on the description listed above and in Dr. Annmarie note.  I will also update the patient and any family that may be present [CF]  0131 (Delayed documentation) I consulted in person with Dr. Cleatus with the hospitalist service.  She will admit the patient. [CF]    Clinical Course User Index [CF] Gordan Huxley, MD     Medications  lactated ringers  infusion (has no administration in time range)  lactated ringers  bolus 1,000 mL (0 mLs Intravenous Stopped 09/08/23 0044)    And  lactated ringers  bolus 1,000 mL (1,000 mLs Intravenous New Bag/Given 09/08/23 0045)    And  lactated ringers  bolus 250 mL (has no administration in time range)  cefTRIAXone  (ROCEPHIN ) 2 g in sodium chloride  0.9 % 100 mL IVPB (0 g Intravenous Stopped 09/07/23 2331)  vancomycin  (VANCOCIN ) IVPB 1000 mg/200 mL premix (0 mg Intravenous Stopped 09/08/23 0044)     ED Discharge Orders     None      Final diagnoses:  Metabolic encephalopathy  Sacral wound, initial encounter  AKI (acute kidney injury) (HCC)     Gordan Huxley, MD 09/08/23 0131

## 2023-09-07 NOTE — ED Notes (Signed)
 Patient transported to CT

## 2023-09-07 NOTE — ED Triage Notes (Signed)
 Pt was BIB [facility] from home reports ongoing weakness, body aches, and malaise x 1 week. No cough. No fever. No urinary s/s. A&Ox3, Pt confused to date.

## 2023-09-07 NOTE — ED Triage Notes (Signed)
 Arrives from home via [facility]  Has a sacral wound.  Family reports transient AMS. Currently AOx4.  ETC:16-26 HR:70 141/52 98 RA 16 98.8 ax CBG:118  Patietn reporting knee pain.

## 2023-09-08 ENCOUNTER — Inpatient Hospital Stay: Payer: 59

## 2023-09-08 DIAGNOSIS — E872 Acidosis, unspecified: Secondary | ICD-10-CM | POA: Diagnosis present

## 2023-09-08 DIAGNOSIS — L8990 Pressure ulcer of unspecified site, unspecified stage: Secondary | ICD-10-CM | POA: Diagnosis present

## 2023-09-08 DIAGNOSIS — N179 Acute kidney failure, unspecified: Secondary | ICD-10-CM | POA: Diagnosis present

## 2023-09-08 DIAGNOSIS — L02416 Cutaneous abscess of left lower limb: Secondary | ICD-10-CM

## 2023-09-08 DIAGNOSIS — Z6841 Body Mass Index (BMI) 40.0 and over, adult: Secondary | ICD-10-CM | POA: Diagnosis not present

## 2023-09-08 DIAGNOSIS — L089 Local infection of the skin and subcutaneous tissue, unspecified: Secondary | ICD-10-CM | POA: Diagnosis present

## 2023-09-08 DIAGNOSIS — F32A Depression, unspecified: Secondary | ICD-10-CM | POA: Insufficient documentation

## 2023-09-08 DIAGNOSIS — D649 Anemia, unspecified: Secondary | ICD-10-CM

## 2023-09-08 DIAGNOSIS — D638 Anemia in other chronic diseases classified elsewhere: Secondary | ICD-10-CM | POA: Diagnosis present

## 2023-09-08 DIAGNOSIS — R54 Age-related physical debility: Secondary | ICD-10-CM

## 2023-09-08 DIAGNOSIS — G4733 Obstructive sleep apnea (adult) (pediatric): Secondary | ICD-10-CM

## 2023-09-08 DIAGNOSIS — L89323 Pressure ulcer of left buttock, stage 3: Secondary | ICD-10-CM | POA: Diagnosis present

## 2023-09-08 DIAGNOSIS — G9341 Metabolic encephalopathy: Secondary | ICD-10-CM

## 2023-09-08 DIAGNOSIS — E8721 Acute metabolic acidosis: Secondary | ICD-10-CM | POA: Diagnosis present

## 2023-09-08 DIAGNOSIS — E785 Hyperlipidemia, unspecified: Secondary | ICD-10-CM | POA: Diagnosis present

## 2023-09-08 DIAGNOSIS — R532 Functional quadriplegia: Secondary | ICD-10-CM | POA: Insufficient documentation

## 2023-09-08 DIAGNOSIS — Z7189 Other specified counseling: Secondary | ICD-10-CM | POA: Diagnosis not present

## 2023-09-08 DIAGNOSIS — Z515 Encounter for palliative care: Secondary | ICD-10-CM | POA: Diagnosis not present

## 2023-09-08 DIAGNOSIS — Z66 Do not resuscitate: Secondary | ICD-10-CM | POA: Diagnosis present

## 2023-09-08 DIAGNOSIS — J449 Chronic obstructive pulmonary disease, unspecified: Secondary | ICD-10-CM | POA: Diagnosis present

## 2023-09-08 DIAGNOSIS — I10 Essential (primary) hypertension: Secondary | ICD-10-CM | POA: Diagnosis present

## 2023-09-08 DIAGNOSIS — L89154 Pressure ulcer of sacral region, stage 4: Secondary | ICD-10-CM | POA: Diagnosis present

## 2023-09-08 DIAGNOSIS — G8929 Other chronic pain: Secondary | ICD-10-CM | POA: Diagnosis present

## 2023-09-08 DIAGNOSIS — Z7401 Bed confinement status: Secondary | ICD-10-CM | POA: Diagnosis not present

## 2023-09-08 DIAGNOSIS — L0231 Cutaneous abscess of buttock: Secondary | ICD-10-CM | POA: Diagnosis present

## 2023-09-08 DIAGNOSIS — E860 Dehydration: Secondary | ICD-10-CM | POA: Diagnosis present

## 2023-09-08 DIAGNOSIS — E876 Hypokalemia: Secondary | ICD-10-CM | POA: Diagnosis present

## 2023-09-08 DIAGNOSIS — E43 Unspecified severe protein-calorie malnutrition: Secondary | ICD-10-CM | POA: Diagnosis present

## 2023-09-08 DIAGNOSIS — S31000A Unspecified open wound of lower back and pelvis without penetration into retroperitoneum, initial encounter: Secondary | ICD-10-CM

## 2023-09-08 DIAGNOSIS — L8993 Pressure ulcer of unspecified site, stage 3: Secondary | ICD-10-CM | POA: Diagnosis not present

## 2023-09-08 DIAGNOSIS — D62 Acute posthemorrhagic anemia: Secondary | ICD-10-CM | POA: Diagnosis not present

## 2023-09-08 DIAGNOSIS — E039 Hypothyroidism, unspecified: Secondary | ICD-10-CM | POA: Diagnosis present

## 2023-09-08 LAB — BASIC METABOLIC PANEL
Anion gap: 12 (ref 5–15)
BUN: 38 mg/dL — ABNORMAL HIGH (ref 8–23)
CO2: 16 mmol/L — ABNORMAL LOW (ref 22–32)
Calcium: 8.8 mg/dL — ABNORMAL LOW (ref 8.9–10.3)
Chloride: 108 mmol/L (ref 98–111)
Creatinine, Ser: 1.44 mg/dL — ABNORMAL HIGH (ref 0.44–1.00)
GFR, Estimated: 34 mL/min — ABNORMAL LOW (ref >60)
Glucose, Bld: 79 mg/dL (ref 70–99)
Potassium: 4.9 mmol/L (ref 3.5–5.1)
Sodium: 136 mmol/L (ref 135–145)

## 2023-09-08 LAB — CBC
HCT: 24.8 % — ABNORMAL LOW (ref 36.0–46.0)
Hemoglobin: 7.9 g/dL — ABNORMAL LOW (ref 12.0–15.0)
MCH: 31.6 pg (ref 26.0–34.0)
MCHC: 31.9 g/dL (ref 30.0–36.0)
MCV: 99.2 fL (ref 80.0–100.0)
Platelets: 114 10*3/uL — ABNORMAL LOW (ref 150–400)
RBC: 2.5 MIL/uL — ABNORMAL LOW (ref 3.87–5.11)
RDW: 16.6 % — ABNORMAL HIGH (ref 11.5–15.5)
WBC: 8.1 10*3/uL (ref 4.0–10.5)
nRBC: 0 % (ref 0.0–0.2)

## 2023-09-08 LAB — FERRITIN: Ferritin: 732 ng/mL — ABNORMAL HIGH (ref 11–307)

## 2023-09-08 LAB — IRON AND TIBC
Iron: 24 ug/dL — ABNORMAL LOW (ref 28–170)
Saturation Ratios: 21 % (ref 10.4–31.8)
TIBC: 113 ug/dL — ABNORMAL LOW (ref 250–450)
UIBC: 89 ug/dL

## 2023-09-08 LAB — HEPATIC FUNCTION PANEL
ALT: 19 U/L (ref 0–44)
AST: 36 U/L (ref 15–41)
Albumin: 2.6 g/dL — ABNORMAL LOW (ref 3.5–5.0)
Alkaline Phosphatase: 64 U/L (ref 38–126)
Bilirubin, Direct: 0.3 mg/dL — ABNORMAL HIGH (ref 0.0–0.2)
Indirect Bilirubin: 0.6 mg/dL (ref 0.3–0.9)
Total Bilirubin: 0.9 mg/dL (ref 0.0–1.2)
Total Protein: 6.8 g/dL (ref 6.5–8.1)

## 2023-09-08 LAB — RETICULOCYTES
Immature Retic Fract: 30.7 % — ABNORMAL HIGH (ref 2.3–15.9)
RBC.: 2.69 MIL/uL — ABNORMAL LOW (ref 3.87–5.11)
Retic Count, Absolute: 45.5 10*3/uL (ref 19.0–186.0)
Retic Ct Pct: 1.7 % (ref 0.4–3.1)

## 2023-09-08 LAB — FOLATE: Folate: 8.5 ng/mL (ref >5.9)

## 2023-09-08 LAB — VITAMIN B12: Vitamin B-12: 2049 pg/mL — ABNORMAL HIGH (ref 180–914)

## 2023-09-08 LAB — LIPASE, BLOOD: Lipase: 22 U/L (ref 11–51)

## 2023-09-08 MED ORDER — DEXTROSE-SODIUM CHLORIDE 5-0.9 % IV SOLN: INTRAVENOUS | Status: AC

## 2023-09-08 MED ORDER — ACETAMINOPHEN 325 MG PO TABS
650.0000 mg | ORAL_TABLET | Freq: Four times a day (QID) | ORAL | Status: DC | PRN
  Administered 2023-09-11 – 2023-09-30 (×6): 650 mg via ORAL
  Filled 2023-09-08 (×5): qty 2

## 2023-09-08 MED ORDER — DONEPEZIL HCL 5 MG PO TABS
5.0000 mg | ORAL_TABLET | Freq: Every day | ORAL | Status: DC
  Administered 2023-09-08 – 2023-09-29 (×22): 5 mg via ORAL
  Filled 2023-09-08 (×22): qty 1

## 2023-09-08 MED ORDER — LIDOCAINE HCL (PF) 1 % IJ SOLN
10.0000 mL | Freq: Once | INTRAMUSCULAR | Status: AC
  Administered 2023-09-08: 10 mL via INTRADERMAL
  Filled 2023-09-08: qty 10

## 2023-09-08 MED ORDER — IOHEXOL 300 MG/ML  SOLN: 80.0000 mL | Freq: Once | INTRAMUSCULAR | Status: DC | PRN

## 2023-09-08 MED ORDER — VANCOMYCIN VARIABLE DOSE PER UNSTABLE RENAL FUNCTION (PHARMACIST DOSING): Status: DC

## 2023-09-08 MED ORDER — ALBUTEROL SULFATE (2.5 MG/3ML) 0.083% IN NEBU
2.5000 mg | INHALATION_SOLUTION | Freq: Four times a day (QID) | RESPIRATORY_TRACT | Status: DC
  Administered 2023-09-08 – 2023-09-09 (×5): 2.5 mg via RESPIRATORY_TRACT
  Filled 2023-09-08 (×6): qty 3

## 2023-09-08 MED ORDER — VANCOMYCIN HCL 500 MG/100ML IV SOLN
500.0000 mg | Freq: Once | INTRAVENOUS | Status: AC
  Administered 2023-09-08: 500 mg via INTRAVENOUS
  Filled 2023-09-08: qty 100

## 2023-09-08 MED ORDER — HYDROCODONE-ACETAMINOPHEN 5-325 MG PO TABS
1.0000 | ORAL_TABLET | ORAL | Status: DC | PRN
  Administered 2023-09-09: 1 via ORAL
  Administered 2023-09-12 (×2): 2 via ORAL
  Administered 2023-09-14 – 2023-09-17 (×7): 1 via ORAL
  Administered 2023-09-17 – 2023-09-20 (×4): 2 via ORAL
  Administered 2023-09-20: 1 via ORAL
  Administered 2023-09-22 – 2023-09-24 (×5): 2 via ORAL
  Administered 2023-09-24 – 2023-09-28 (×3): 1 via ORAL
  Administered 2023-09-29 – 2023-09-30 (×3): 2 via ORAL
  Filled 2023-09-08 (×5): qty 1
  Filled 2023-09-08: qty 2
  Filled 2023-09-08: qty 1
  Filled 2023-09-08: qty 2
  Filled 2023-09-08 (×2): qty 1
  Filled 2023-09-08 (×2): qty 2
  Filled 2023-09-08: qty 1
  Filled 2023-09-08 (×2): qty 2
  Filled 2023-09-08: qty 1
  Filled 2023-09-08: qty 2
  Filled 2023-09-08 (×2): qty 1
  Filled 2023-09-08 (×3): qty 2
  Filled 2023-09-08 (×2): qty 1
  Filled 2023-09-08 (×4): qty 2

## 2023-09-08 MED ORDER — SERTRALINE HCL 50 MG PO TABS
100.0000 mg | ORAL_TABLET | Freq: Every day | ORAL | Status: DC
  Administered 2023-09-08: 100 mg via ORAL
  Filled 2023-09-08: qty 2

## 2023-09-08 MED ORDER — SODIUM CHLORIDE 0.9 % IV SOLN
1.0000 g | INTRAVENOUS | Status: DC
  Administered 2023-09-08 – 2023-09-11 (×3): 1 g via INTRAVENOUS
  Filled 2023-09-08 (×3): qty 10

## 2023-09-08 MED ORDER — LEVOTHYROXINE SODIUM 50 MCG PO TABS
150.0000 ug | ORAL_TABLET | Freq: Every day | ORAL | Status: DC
  Administered 2023-09-09 – 2023-09-30 (×22): 150 ug via ORAL
  Filled 2023-09-08 (×2): qty 1
  Filled 2023-09-08: qty 3
  Filled 2023-09-08 (×19): qty 1

## 2023-09-08 MED ORDER — ONDANSETRON HCL 4 MG/2ML IJ SOLN: 4.0000 mg | Freq: Four times a day (QID) | INTRAMUSCULAR | Status: DC | PRN

## 2023-09-08 MED ORDER — ONDANSETRON HCL 4 MG PO TABS: 4.0000 mg | ORAL_TABLET | Freq: Four times a day (QID) | ORAL | Status: DC | PRN

## 2023-09-08 MED ORDER — ENOXAPARIN SODIUM 30 MG/0.3ML IJ SOSY
30.0000 mg | PREFILLED_SYRINGE | INTRAMUSCULAR | Status: DC
  Administered 2023-09-08: 30 mg via SUBCUTANEOUS
  Filled 2023-09-08: qty 0.3

## 2023-09-08 MED ORDER — ACETAMINOPHEN 650 MG RE SUPP: 650.0000 mg | Freq: Four times a day (QID) | RECTAL | Status: DC | PRN

## 2023-09-08 MED ORDER — SERTRALINE HCL 50 MG PO TABS
50.0000 mg | ORAL_TABLET | Freq: Every day | ORAL | Status: DC
  Administered 2023-09-09 – 2023-09-30 (×22): 50 mg via ORAL
  Filled 2023-09-08 (×22): qty 1

## 2023-09-08 MED ORDER — CYCLOBENZAPRINE HCL 10 MG PO TABS
5.0000 mg | ORAL_TABLET | Freq: Three times a day (TID) | ORAL | Status: DC | PRN
  Administered 2023-09-12 – 2023-09-24 (×3): 5 mg via ORAL
  Filled 2023-09-08 (×3): qty 1

## 2023-09-08 MED ORDER — LEVOTHYROXINE SODIUM 50 MCG PO TABS
200.0000 ug | ORAL_TABLET | Freq: Every day | ORAL | Status: DC
  Administered 2023-09-08: 200 ug via ORAL
  Filled 2023-09-08: qty 4

## 2023-09-08 NOTE — Assessment & Plan Note (Signed)
Continue CPAP nightly. °

## 2023-09-08 NOTE — Consult Note (Addendum)
 WOC Nurse Consult Note: Reason for Consult: Consult requested for left buttock wound.  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  Large amt thick tan pus drainage, painful to touch.  The tunneling area is narrow and there is more pus beneath the surface which is unable to drain, as evidenced by fluctuance surrounding the wound.  Secure chat message sent to the primary team as follows: This patient has a pressure injury to the left buttock with large amt pus and extensive tunneling.  I recommend a CT scan to assess for an abscess/osteomyelitis and a surgical consult to enlarge the opening to allow for more effective drainage of the location.   Dressing procedure/placement/frequency: Topical treatment orders provided for bedside nurses to perform as follows: Apply moist gauze packing to left buttock wound q day, using swab to fill, then cover with ABD pad and tape Please re-consult if further assistance is needed.  Thank-you,  Stephane Fought MSN, RN, CWOCN, [place], CNS [phone]

## 2023-09-08 NOTE — Progress Notes (Signed)
 Anticoagulation monitoring(Lovenox ):  89 yo female ordered Lovenox  40 mg Q24h    Filed Weights   09/07/23 1913  Weight: 66.7 kg (147 lb)   BMI 26   Lab Results  Component Value Date   CREATININE 1.44 (H) 09/07/2023   CREATININE 0.99 02/27/2021   CREATININE 1.10 (H) 02/26/2021   Estimated Creatinine Clearance: 22.9 mL/min (A) (by C-G formula based on SCr of 1.44 mg/dL (H)). Hemoglobin & Hematocrit     Component Value Date/Time   HGB 8.6 (L) 09/07/2023 1915   HGB 9.8 (L) 08/09/2012 1638   HCT 25.4 (L) 09/07/2023 1915   HCT 30.4 (L) 08/09/2012 1638     Per Protocol for Patient with estCrcl < 30 ml/min and BMI < 30, will transition to Lovenox  30 mg Q24h.

## 2023-09-08 NOTE — Assessment & Plan Note (Signed)
 Continue sertraline

## 2023-09-08 NOTE — ED Notes (Signed)
 Resumed care from jes rn.  Pt alert, iv fluids infusing.

## 2023-09-08 NOTE — Assessment & Plan Note (Addendum)
 Patient is not septic at this time -Rocephin and vancomycin -Wound care

## 2023-09-08 NOTE — Progress Notes (Signed)
 Progress Note   Patient: Danielle [name] FMW:[id number] DOB: 07/05/1929 DOA: 09/07/2023     0 DOS: the patient was seen and examined on 09/08/2023   Brief hospital course: Danielle [name] is a 89 y.o. female with medical history significant for HTN, CAD, hypothyroidism, depression, COPD, OSA on CPAP, osteoarthritis, nonambulant at baseline and total care dependent with unknown left ischial sacral decubitus, managed by home health nurse who was brought to the ED with a 1 week history of weakness and in the past 2 days, altered mental status described as talking out of her head.  History is taken from her daughter at bedside.  Daughter states that she also noticed that over the past 2 days who also has had a foul-smelling drainage as well as bleeding.  Patient has no history of dementia though she has been getting forgetful of late but is awake and alert at baseline.  She lives alone but has an engineer, production and a son that lives behind her that checks in on her. ED course and data review: Vitals for the most part within normal limits Notable findings on workup include the following: Normal WBC of 9600 with lactic acid 1.5 Negative respiratory viral panel Hemoglobin 8.6 with no recent baseline Creatinine 1.44, most recent 0.99 a couple years prior Urinalysis with 5 ketones and without evidence of infection EKG, personally viewed and interpreted showing NSR at 69 with no acute ST-T wave changes CT head nonacute Chest x-ray blunting of right costophrenic angle   Patient treated with Rocephin  and vancomycin  for infected decubitus ulcer and given a fluid bolus Hospitalist consulted for admission.    Assessment and Plan:  * Infected decubitus ulcer Patient is not septic at this time - Continue Rocephin  and vancomycin  I have consulted general surgeon and case discussed   Chronic anemia Suspect chronic blood loss from wound Anemia panel Monitor H&H   AKI (acute kidney injury)  (HCC) Dehydration Suspect ATN from dehydration related to poor oral intake Continue hydration -Monitor renal function and avoid nephrotoxins   Acute metabolic encephalopathy-improved Likely secondary to acute infection and dehydration Recent forgetfulness but no history of dementia, per daughter Continue delirium precaution   Osteoarthritis of knees, bilateral Bedbound status secondary to osteoarthritis Advanced age and frailty/functional quadriplegia Patient is total care dependent Patient receives knee injections due to chronic pain Continue naproxen, acetaminophen , cyclobenzaprine  Continue PT OT   OSA on CPAP Continue CPAP nightly   Depression Continue sertraline    Hypothyroidism, unspecified Continue levothyroxine    Chronic obstructive pulmonary disease (HCC) Not acutely exacerbated Albuterol  as needed Patient follows with pulmonology, last seen October 2024   Hypertension Hold HCTZ due to dehydration   Coronary arteriosclerosis in native artery No complaints of chest pain Continue Imdur  and aspirin     DVT prophylaxis: Lovenox    Consults: none   Advance Care Planning: DNR/DNI   Family Communication: Daughter, [name]   Disposition Plan: Back to previous home environment     Subjective:  Patient seen and examined at bedside this morning In the presence of patient's son She admits to improvement in the back pain Denies nausea vomiting abdominal pain no chest pain  Physical Exam:  Vitals and nursing note reviewed.  Constitutional:      General: She is sleeping. She is not in acute distress. HENT:     Head: Normocephalic and atraumatic.  Cardiovascular:     Rate and Rhythm: Normal rate and regular rhythm.     Heart sounds: Normal heart sounds.  Pulmonary:  Effort: Pulmonary effort is normal.     Breath sounds: Normal breath sounds.  Abdominal:     Palpations: Abdomen is soft.     Tenderness: There is no abdominal tenderness.  Skin:     Comments: Foul-smelling thick bloody drainage from wound left buttock.  See pics below  Neurological:     General: No focal deficit present.     Mental Status: She is easily aroused.  Vitals:   09/08/23 1751 09/08/23 1752 09/08/23 1753 09/08/23 1754  BP:      Pulse:   70 75  Resp: (!) 21 18 (!) 21 (!) 21  Temp:      TempSrc:      SpO2:   90% (!) 87%  Weight:      Height:        Data Reviewed:    Latest Ref Rng & Units 09/08/2023    3:47 AM 09/07/2023    7:15 PM 02/27/2021    6:47 AM  CMP  Glucose 70 - 99 mg/dL 79  90  72   BUN 8 - 23 mg/dL 38  38  26   Creatinine 0.44 - 1.00 mg/dL 8.55  8.55  9.00   Sodium 135 - 145 mmol/L 136  135  140   Potassium 3.5 - 5.1 mmol/L 4.9  4.9  4.1   Chloride 98 - 111 mmol/L 108  103  108   CO2 22 - 32 mmol/L 16  19  23    Calcium 8.9 - 10.3 mg/dL 8.8  9.7  9.4   Total Protein 6.5 - 8.1 g/dL  6.8    Total Bilirubin 0.0 - 1.2 mg/dL  0.9    Alkaline Phos 38 - 126 U/L  64    AST 15 - 41 U/L  36    ALT 0 - 44 U/L  19         Latest Ref Rng & Units 09/08/2023    4:49 AM 09/07/2023    7:15 PM 02/27/2021    6:47 AM  CBC  WBC 4.0 - 10.5 K/uL 8.1  9.6  3.3   Hemoglobin 12.0 - 15.0 g/dL 7.9  8.6  9.9   Hematocrit 36.0 - 46.0 % 24.8  25.4  31.2   Platelets 150 - 400 K/uL 114  198  208      Time spent: 56 minutes  Author: Drue ONEIDA Potter, MD 09/08/2023 6:03 PM  For on call review [email or web address].

## 2023-09-08 NOTE — Assessment & Plan Note (Signed)
 Likely secondary to acute infection and dehydration, though without evidence of sepsis at this time Recent forgetfulness but no history of dementia, per daughter -Neurologic checks - Delirium precautions

## 2023-09-08 NOTE — H&P (Addendum)
 History and Physical    Patient: Danielle [name] FMW:[id number] DOB: 1929/07/22 DOA: 09/07/2023 DOS: the patient was seen and examined on 09/08/2023 PCP: Tobie Domino, MD  Patient coming from: Home  Chief Complaint:  Chief Complaint  Patient presents with   Weakness    HPI: Danielle [name] is a 89 y.o. female with medical history significant for HTN, CAD, hypothyroidism, depression, COPD, OSA on CPAP, osteoarthritis, nonambulant at baseline and total care dependent with unknown left ischial sacral decubitus, managed by home health nurse who was brought to the ED with a 1 week history of weakness and in the past 2 days, altered mental status described as talking out of her head.  History is taken from her daughter at bedside.  Daughter states that she also noticed that over the past 2 days who also has had a foul-smelling drainage as well as bleeding.  Patient has no history of dementia though she has been getting forgetful of late but is awake and alert at baseline.  She lives alone but has an engineer, production and a son that lives behind her that checks in on her. ED course and data review: Vitals for the most part within normal limits Notable findings on workup include the following: Normal WBC of 9600 with lactic acid 1.5 Negative respiratory viral panel Hemoglobin 8.6 with no recent baseline Creatinine 1.44, most recent 0.99 a couple years prior Urinalysis with 5 ketones and without evidence of infection EKG, personally viewed and interpreted showing NSR at 69 with no acute ST-T wave changes CT head nonacute Chest x-ray blunting of right costophrenic angle  Patient treated with Rocephin  and vancomycin  for infected decubitus ulcer and given a fluid bolus Hospitalist consulted for admission.       Past Medical History:  Diagnosis Date   Anxiety    Arthritis    Depression    Dyspnea    with exertion   Headache    migraines   History of myocarditis    Hyperlipemia    Hypertension     Hypocholesterolemia    Hypothyroidism    Myocardial infarction (HCC) 2010   Sleep apnea    uses a cpap   Past Surgical History:  Procedure Laterality Date   ABDOMINAL HYSTERECTOMY     APPENDECTOMY     CATARACT EXTRACTION W/PHACO Left 03/09/2020   Procedure: CATARACT EXTRACTION PHACO AND INTRAOCULAR LENS PLACEMENT (IOC) LEFT;  Surgeon: Mittie Gaskin, MD;  Location: [facility] ORS;  Service: Ophthalmology;  Laterality: Left;  cde 9.56 us1:32.8 ap10.3%   EYE SURGERY Right    cataract extraction   Social History:  reports that she has never smoked. She has never used smokeless tobacco. She reports that she does not drink alcohol and does not use drugs.  Allergies  Allergen Reactions   Ace Inhibitors Other (See Comments)    Angiodema to Ace-inhibitor 2013   Penicillins Rash    SEVERE RASH. Has patient had a PCN reaction causing immediate rash, facial/tongue/throat swelling, SOB or lightheadedness with hypotension: Unknown Has patient had a PCN reaction causing severe rash involving mucus membranes or skin necrosis: Unknown Has patient had a PCN reaction that required hospitalization: Unknown Has patient had a PCN reaction occurring within the last 10 years: Unknown If all of the above answers are NO, then may proceed with Cephalosporin use. Unknown    Penicillin V Potassium Nausea And Vomiting and Rash    Family History  Problem Relation Age of Onset   Bladder Cancer Neg Hx  Kidney cancer Neg Hx    Breast cancer Neg Hx     Prior to Admission medications   Medication Sig Start Date End Date Taking? Authorizing Provider  acetaminophen  (TYLENOL ) 500 MG tablet 1,000 mg every 8 (eight) hours as needed for moderate pain.     [provider]  alum & mag hydroxide-simeth (MAALOX PLUS) 400-400-40 MG/5ML suspension Take 15 mLs by mouth as needed for indigestion.     [provider]  aspirin  EC 81 MG tablet Take 1 tablet by mouth daily. 11/28/14   [provider]  cetirizine (ZYRTEC) 10 MG tablet Take 10 mg by mouth daily. 05/16/22   [provider]  cyclobenzaprine  (FLEXERIL ) 5 MG tablet Take 5 mg by mouth 3 (three) times daily as needed. 05/13/22   [provider]  diphenhydrAMINE  (BENADRYL ) 25 mg capsule Take 25 mg by mouth at bedtime as needed for sleep.    [provider]  donepezil  (ARICEPT ) 5 MG tablet Take 5 mg by mouth at bedtime.    [provider]  doxycycline  (VIBRA -TABS) 100 MG tablet Take 1 tablet (100 mg total) by mouth 2 (two) times daily. 10/15/22   Hyatt, Max T, DPM  fluticasone (FLONASE) 50 MCG/ACT nasal spray Place 1 spray into both nostrils daily. 05/16/22   [provider]  hydrochlorothiazide  (HYDRODIURIL ) 25 MG tablet Take 25 mg by mouth daily.    [provider]  isosorbide  mononitrate (IMDUR ) 60 MG 24 hr tablet Take 60 mg by mouth daily.    [provider]  levothyroxine  (SYNTHROID ) 200 MCG tablet Take 200 mcg by mouth daily before breakfast.  03/29/19   [provider]  mometasone  (ELOCON ) 0.1 % cream Apply topically to aa of rash at body bid m - f weekly use only when flared. D/c when rash is clear 06/03/22   Hester Alm BROCKS, MD  naproxen sodium (ALEVE) 220 MG tablet Take 220 mg by mouth 2 (two) times daily as needed (pain).    [provider]  nitroGLYCERIN  (NITROSTAT ) 0.4 MG SL tablet Place 0.4 mg under the tongue every 5 (five) minutes as needed for chest pain.  01/22/17   [provider]  ondansetron  (ZOFRAN  ODT) 4 MG disintegrating tablet Take 1 tablet (4 mg total) by mouth every 8 (eight) hours as needed for nausea or vomiting. 02/10/21   Viviann Pastor, MD  polyethylene glycol powder (GLYCOLAX /MIRALAX ) 17 GM/SCOOP powder Take 17 g by mouth daily. Can increase to up to 51 grams daily, goal 1-2 soft formed bowel movements daily 02/27/21   Wouk, Devaughn Sayres, MD  potassium chloride  (K-DUR,KLOR-CON ) 10 MEQ tablet Take 10 mEq by  mouth 2 (two) times daily.  03/02/18   [provider]  senna (SENOKOT) 8.6 MG TABS tablet Take 1 tablet (8.6 mg total) by mouth daily. 02/27/21   Wouk, Devaughn Sayres, MD  sertraline  (ZOLOFT ) 100 MG tablet Take 100 mg by mouth daily.  08/09/17   [provider]    Physical Exam: Vitals:   09/08/23 0130 09/08/23 0145 09/08/23 0200 09/08/23 0215  BP: (!) 149/49  (!) 136/42   Pulse: 66 69 65 64  Resp: 17 17 16 16   Temp:      TempSrc:      SpO2: 93% 99% 100% 100%  Weight:      Height:       Physical Exam Vitals and nursing note reviewed.  Constitutional:      General: She is sleeping. She is not in  acute distress. HENT:     Head: Normocephalic and atraumatic.  Cardiovascular:     Rate and Rhythm: Normal rate and regular rhythm.     Heart sounds: Normal heart sounds.  Pulmonary:     Effort: Pulmonary effort is normal.     Breath sounds: Normal breath sounds.  Abdominal:     Palpations: Abdomen is soft.     Tenderness: There is no abdominal tenderness.  Skin:    Comments: Foul-smelling thick bloody drainage from wound left buttock.  See pics below  Neurological:     General: No focal deficit present.     Mental Status: She is easily aroused.        Labs on Admission: I have personally reviewed following labs and imaging studies  CBC: Recent Labs  Lab 09/07/23 1915  WBC 9.6  HGB 8.6*  HCT 25.4*  MCV 94.1  PLT 198   Basic Metabolic Panel: Recent Labs  Lab 09/07/23 1915  NA 135  K 4.9  CL 103  CO2 19*  GLUCOSE 90  BUN 38*  CREATININE 1.44*  CALCIUM 9.7   GFR: Estimated Creatinine Clearance: 22.9 mL/min (A) (by C-G formula based on SCr of 1.44 mg/dL (H)). Liver Function Tests: Recent Labs  Lab 09/07/23 1915  AST 36  ALT 19  ALKPHOS 64  BILITOT 0.9  PROT 6.8  ALBUMIN 2.6*   Recent Labs  Lab 09/07/23 1915  LIPASE 22   No results for input(s): AMMONIA in the last 168 hours. Coagulation Profile: Recent Labs  Lab 09/07/23 2244   INR 1.3*   Cardiac Enzymes: No results for input(s): CKTOTAL, CKMB, CKMBINDEX, TROPONINI in the last 168 hours. BNP (last 3 results) No results for input(s): PROBNP in the last 8760 hours. HbA1C: No results for input(s): HGBA1C in the last 72 hours. CBG: No results for input(s): GLUCAP in the last 168 hours. Lipid Profile: No results for input(s): CHOL, HDL, LDLCALC, TRIG, CHOLHDL, LDLDIRECT in the last 72 hours. Thyroid  Function Tests: No results for input(s): TSH, T4TOTAL, FREET4, T3FREE, THYROIDAB in the last 72 hours. Anemia Panel: No results for input(s): VITAMINB12, FOLATE, FERRITIN, TIBC, IRON, RETICCTPCT in the last 72 hours. Urine analysis:    Component Value Date/Time   COLORURINE YELLOW (A) 09/07/2023 2228   APPEARANCEUR HAZY (A) 09/07/2023 2228   APPEARANCEUR Clear 07/31/2020 1517   LABSPEC 1.018 09/07/2023 2228   LABSPEC 1.023 08/09/2012 1758   PHURINE 5.0 09/07/2023 2228   GLUCOSEU NEGATIVE 09/07/2023 2228   GLUCOSEU Negative 08/09/2012 1758   HGBUR NEGATIVE 09/07/2023 2228   BILIRUBINUR NEGATIVE 09/07/2023 2228   BILIRUBINUR Negative 07/31/2020 1517   BILIRUBINUR Negative 08/09/2012 1758   KETONESUR 5 (A) 09/07/2023 2228   PROTEINUR NEGATIVE 09/07/2023 2228   NITRITE NEGATIVE 09/07/2023 2228   LEUKOCYTESUR NEGATIVE 09/07/2023 2228   LEUKOCYTESUR Trace 08/09/2012 1758    Radiological Exams on Admission: CT Head Wo Contrast Result Date: 09/08/2023 CLINICAL DATA:  Mental status change EXAM: CT HEAD WITHOUT CONTRAST TECHNIQUE: Contiguous axial images were obtained from the base of the skull through the vertex without intravenous contrast. RADIATION DOSE REDUCTION: This exam was performed according to the departmental dose-optimization program which includes automated exposure control, adjustment of the mA and/or kV according to patient size and/or use of iterative reconstruction technique. COMPARISON:  CT 02/10/2021  FINDINGS: Brain: No intracranial hemorrhage, mass effect, or evidence of acute infarct. No hydrocephalus. Age-commensurate cerebral atrophy and chronic small vessel ischemic disease. Chronic right frontal and parietal infarcts. Unchanged  small bilateral subdural hygromas Vascular: No hyperdense vessel. Intracranial arterial calcification. Skull: No fracture or focal lesion. Sinuses/Orbits: No acute finding. Other: None. IMPRESSION: 1. No acute intracranial abnormality. Electronically Signed   By: Norman Gatlin M.D.   On: 09/08/2023 00:16   DG Chest Port 1 View Result Date: 09/07/2023 CLINICAL DATA:  Weakness, malaise.  Questionable sepsis. EXAM: PORTABLE CHEST 1 VIEW COMPARISON:  02/04/2021 FINDINGS: Mild cardiomegaly. Blunting of the right costophrenic angle could reflect small effusion or pleural scarring. No confluent airspace opacities, no confluent opacities or edema. IMPRESSION: Blunting of the right costophrenic angle could reflect small right effusion or pleural thickening/scarring. Cardiomegaly. Electronically Signed   By: Franky Crease M.D.   On: 09/07/2023 23:32     Data Reviewed: Relevant notes from primary care and specialist visits, past discharge summaries as available in EHR, including Care Everywhere. Prior diagnostic testing as pertinent to current admission diagnoses Updated medications and problem lists for reconciliation ED course, including vitals, labs, imaging, treatment and response to treatment Triage notes, nursing and pharmacy notes and ED provider's notes Notable results as noted in HPI   Assessment and Plan: * Infected decubitus ulcer Patient is not septic at this time -Rocephin  and vancomycin  -Wound care  Chronic anemia Suspect chronic blood loss from wound Anemia panel Monitor H&H  AKI (acute kidney injury) (HCC) Dehydration Suspect ATN from dehydration related to poor oral intake -IV hydration -Monitor renal function and avoid nephrotoxins  Acute  metabolic encephalopathy Likely secondary to acute infection and dehydration, though without evidence of sepsis at this time Recent forgetfulness but no history of dementia, per daughter -Neurologic checks - Delirium precautions  Osteoarthritis of knees, bilateral Bedbound status secondary to osteoarthritis Advanced age and frailty/functional quadriplegia Patient is total care dependent Patient receives knee injections due to chronic pain Continue naproxen, acetaminophen , cyclobenzaprine  PT and TOC and dietary consults  OSA on CPAP Continue CPAP nightly  Depression Continue sertraline   Hypothyroidism, unspecified Continue levothyroxine   Chronic obstructive pulmonary disease (HCC) Not acutely exacerbated Albuterol  as needed Patient follows with pulmonology, last seen October 2024  Hypertension Hold HCTZ due to dehydration  Coronary arteriosclerosis in native artery No complaints of chest pain Continue Imdur  and aspirin         DVT prophylaxis: Lovenox   Consults: none  Advance Care Planning: DNR/DNI  Family Communication: Daughter, [name]  Disposition Plan: Back to previous home environment  Severity of Illness: The appropriate patient status for this patient is INPATIENT. Inpatient status is judged to be reasonable and necessary in order to provide the required intensity of service to ensure the patient's safety. The patient's presenting symptoms, physical exam findings, and initial radiographic and laboratory data in the context of their chronic comorbidities is felt to place them at high risk for further clinical deterioration. Furthermore, it is not anticipated that the patient will be medically stable for discharge from the hospital within 2 midnights of admission.   * I certify that at the point of admission it is my clinical judgment that the patient will require inpatient hospital care spanning beyond 2 midnights from the point of admission due to high  intensity of service, high risk for further deterioration and high frequency of surveillance required.*  Author: Delayne LULLA Solian, MD 09/08/2023 2:20 AM  For on call review [email or web address].

## 2023-09-08 NOTE — Assessment & Plan Note (Signed)
 Not acutely exacerbated Albuterol as needed Patient follows with pulmonology, last seen October 2024

## 2023-09-08 NOTE — Consult Note (Addendum)
 Patient ID: Danielle [name], female   DOB: 1929/02/16, 89 y.o.   MRN: [id number] CC: Left Posterior thigh abscess History of Present Illness Danielle [name] is a 89 y.o. female with past medical history as listed below who presents in consultation for a left decubitus ulcer on her posterior thigh.  The patient is accompanied by her son and much of the history is obtained from him.  He reports that she is mostly bedbound but does get up into the wheelchair and transfers to a bed at her home where she lives.  He says that over the last 2 weeks she has developed a sore in her left ischium.  The home health nurses have been trying to dress this but it has started to have expression of purulent drainage.  Due to this she was brought to the emergency department for evaluation.  The son reports that she does have an aide that comes to her house every day.  She has not had any fevers or chills.  She has not had any nausea or vomiting.  She has never had a wound like this before but again does not ambulate.  The patient reports malaise.  Past Medical History Past Medical History:  Diagnosis Date   Anxiety    Arthritis    Depression    Dyspnea    with exertion   Headache    migraines   History of myocarditis    Hyperlipemia    Hypertension    Hypocholesterolemia    Hypothyroidism    Myocardial infarction (HCC) 2010   Sleep apnea    uses a cpap       Past Surgical History:  Procedure Laterality Date   ABDOMINAL HYSTERECTOMY     APPENDECTOMY     CATARACT EXTRACTION W/PHACO Left 03/09/2020   Procedure: CATARACT EXTRACTION PHACO AND INTRAOCULAR LENS PLACEMENT (IOC) LEFT;  Surgeon: Mittie Gaskin, MD;  Location: [facility] ORS;  Service: Ophthalmology;  Laterality: Left;  cde 9.56 us1:32.8 ap10.3%   EYE SURGERY Right    cataract extraction    Allergies  Allergen Reactions   Ace Inhibitors Other (See Comments)    Angiodema to Ace-inhibitor 2013   Penicillins Rash    SEVERE RASH. Has patient  had a PCN reaction causing immediate rash, facial/tongue/throat swelling, SOB or lightheadedness with hypotension: Unknown Has patient had a PCN reaction causing severe rash involving mucus membranes or skin necrosis: Unknown Has patient had a PCN reaction that required hospitalization: Unknown Has patient had a PCN reaction occurring within the last 10 years: Unknown If all of the above answers are NO, then may proceed with Cephalosporin use. Unknown    Penicillin V Potassium Nausea And Vomiting and Rash    Current Facility-Administered Medications  Medication Dose Route Frequency Provider Last Rate Last Admin   acetaminophen  (TYLENOL ) tablet 650 mg  650 mg Oral Q6H PRN Duncan, Hazel V, MD       Or   acetaminophen  (TYLENOL ) suppository 650 mg  650 mg Rectal Q6H PRN Duncan, Hazel V, MD       albuterol  (PROVENTIL ) (2.5 MG/3ML) 0.083% nebulizer solution 2.5 mg  2.5 mg Nebulization Q6H Duncan, Hazel V, MD   2.5 mg at 09/08/23 0750   cefTRIAXone  (ROCEPHIN ) 1 g in sodium chloride  0.9 % 100 mL IVPB  1 g Intravenous Q24H Duncan, Hazel V, MD       cyclobenzaprine  (FLEXERIL ) tablet 5 mg  5 mg Oral TID PRN Cleatus Delayne GAILS, MD  dextrose  5 %-0.9 % sodium chloride  infusion   Intravenous Continuous Cleatus Delayne GAILS, MD 40 mL/hr at 09/08/23 0800 New Bag at 09/08/23 0800   donepezil  (ARICEPT ) tablet 5 mg  5 mg Oral QHS Duncan, Hazel V, MD       enoxaparin  (LOVENOX ) injection 30 mg  30 mg Subcutaneous Q24H Duncan, Hazel V, MD   30 mg at 09/08/23 0750   HYDROcodone -acetaminophen  (NORCO/VICODIN) 5-325 MG per tablet 1-2 tablet  1-2 tablet Oral Q4H PRN Duncan, Hazel V, MD       iohexol  (OMNIPAQUE ) 300 MG/ML solution 80 mL  80 mL Intravenous Once PRN Dorinda Drue DASEN, MD       lactated ringers  infusion   Intravenous Continuous Bradler, Evan K, MD 150 mL/hr at 09/08/23 0214 New Bag at 09/08/23 0214   levothyroxine  (SYNTHROID ) tablet 200 mcg  200 mcg Oral Q0600 Duncan, Hazel V, MD   200 mcg at 09/08/23 0547    lidocaine  (PF) (XYLOCAINE ) 1 % injection 10 mL  10 mL Intradermal Once Marinda Jayson KIDD, MD       ondansetron  (ZOFRAN ) tablet 4 mg  4 mg Oral Q6H PRN Duncan, Hazel V, MD       Or   ondansetron  (ZOFRAN ) injection 4 mg  4 mg Intravenous Q6H PRN Duncan, Hazel V, MD       sertraline  (ZOLOFT ) tablet 100 mg  100 mg Oral Daily Cleatus Delayne GAILS, MD       vancomycin  variable dose per unstable renal function (pharmacist dosing)   Does not apply See admin instructions Cleatus Delayne GAILS, MD       Current Outpatient Medications  Medication Sig Dispense Refill   acetaminophen  (TYLENOL ) 500 MG tablet 1,000 mg every 8 (eight) hours as needed for moderate pain.      alum & mag hydroxide-simeth (MAALOX PLUS) 400-400-40 MG/5ML suspension Take 15 mLs by mouth as needed for indigestion.      aspirin  EC 81 MG tablet Take 1 tablet by mouth daily.     cetirizine (ZYRTEC) 10 MG tablet Take 10 mg by mouth daily.     cyclobenzaprine  (FLEXERIL ) 5 MG tablet Take 5 mg by mouth 3 (three) times daily as needed.     diphenhydrAMINE  (BENADRYL ) 25 mg capsule Take 25 mg by mouth at bedtime as needed for sleep.     donepezil  (ARICEPT ) 5 MG tablet Take 5 mg by mouth at bedtime.     doxycycline  (VIBRA -TABS) 100 MG tablet Take 1 tablet (100 mg total) by mouth 2 (two) times daily. 20 tablet 0   fluticasone (FLONASE) 50 MCG/ACT nasal spray Place 1 spray into both nostrils daily.     hydrochlorothiazide  (HYDRODIURIL ) 25 MG tablet Take 25 mg by mouth daily.     isosorbide  mononitrate (IMDUR ) 60 MG 24 hr tablet Take 60 mg by mouth daily.     levothyroxine  (SYNTHROID ) 200 MCG tablet Take 200 mcg by mouth daily before breakfast.      mometasone  (ELOCON ) 0.1 % cream Apply topically to aa of rash at body bid m - f weekly use only when flared. D/c when rash is clear 45 g 3   naproxen sodium (ALEVE) 220 MG tablet Take 220 mg by mouth 2 (two) times daily as needed (pain).     nitroGLYCERIN  (NITROSTAT ) 0.4 MG SL tablet Place 0.4 mg under the tongue  every 5 (five) minutes as needed for chest pain.      ondansetron  (ZOFRAN  ODT) 4 MG disintegrating tablet Take 1 tablet (  4 mg total) by mouth every 8 (eight) hours as needed for nausea or vomiting. 20 tablet 0   polyethylene glycol powder (GLYCOLAX /MIRALAX ) 17 GM/SCOOP powder Take 17 g by mouth daily. Can increase to up to 51 grams daily, goal 1-2 soft formed bowel movements daily 255 g 0   potassium chloride  (K-DUR,KLOR-CON ) 10 MEQ tablet Take 10 mEq by mouth 2 (two) times daily.      senna (SENOKOT) 8.6 MG TABS tablet Take 1 tablet (8.6 mg total) by mouth daily. 120 tablet 0   sertraline  (ZOLOFT ) 100 MG tablet Take 100 mg by mouth daily.       Family History Family History  Problem Relation Age of Onset   Bladder Cancer Neg Hx    Kidney cancer Neg Hx    Breast cancer Neg Hx        Social History Social History   Tobacco Use   Smoking status: Never   Smokeless tobacco: Never  Vaping Use   Vaping status: Never Used  Substance Use Topics   Alcohol use: No   Drug use: No        ROS Full ROS of systems performed and is otherwise negative there than what is stated in the HPI  Physical Exam Blood pressure (!) 152/50, pulse 75, temperature 97.7 F (36.5 C), temperature source Oral, resp. rate 16, height 5' 3 (1.6 m), weight 66.7 kg, SpO2 100%.  Patient is alert and oriented to self, place and location.  She is able to move her bilateral upper extremities.,  PERRLA, clear to auscultation bilaterally and regular rate and rhythm.  On her left posterior hip over the ischium there is a wound that is open and is draining copious amounts of sanguinous purulent fluid.  This tracks inferior medially.  The overlying skin is thin but it does not look necrotic.  Where the wound tracks I am able to palpate this and express more purulent fluid from the opening that is already there.  Data Reviewed Her labs are significant for a normal white count.  She is anemic to 7.9.  It is slightly  elevated to 1.4 but the last one we have in our system is from 2 years ago.  I do not see any abnormalities on her CT head or on her chest x-ray  I have personally reviewed the patient's imaging and medical records.    Assessment/Plan    Ms. Nesbitt is a 89 year old female who presents from home with a posterior thigh wound that is draining purulent drainage.  The overlying skin looks pretty healthy but there is significant tracking of the wound inferior medially to another area of fluctuance.  My concern is that if we have to open the whole length of where the wound tracks then this would leave a gaping wound that we will most certainly ever heal in a prison that is nonambulatory.  Due to the fact that the skin looks okay I am less concerned that there may be deeper involvement into muscle or even into bone.  The wound is already draining quite well and she has no leukocytosis and is hemodynamically normal.  I will plan to make a counterincision to where the area tracks.  We will then packed this and hopefully this will allow adequate drainage of the wound.  I will also collect samples from the wound from microbial analysis and the primary team should continue on antibiotics for now. Please hold her aspirin .      Jayson MALVA Endow  09/08/2023, 10:19 AM

## 2023-09-08 NOTE — Evaluation (Signed)
 Physical Therapy Evaluation Patient Details Name: Zeina R Jowett MRN: [id number] DOB: [birth year] Today's Date: 09/08/2023  History of Present Illness  89 y/o female presented to ED on 09/07/23 for AMS and L ischial sacral decubitus wound. Admitted for infected decubitus ulcer. PMH: HTN, CAD, depression, COPD, OSA on CPAP  Clinical Impression  Patient admitted with the above. PTA, patient lives alone and is nonambulatory at baseline. Son reports he perform transfers totalA from bed<>w/c<>recliner daily and checks on her every hour. Has aides coming during the week for 4 hour blocks, unsure of exact schedule. Patient required totalA+2 for bed mobility this date. Oriented to name only which son reports that is not baseline. However, mobility is at or near baseline. No skilled acute PT needs identified.   **Patient would benefit from hoyer lift in the home to decrease caregiver burden and prevent injury to caregivers.**       If plan is discharge home, recommend the following: Two people to help with bathing/dressing/bathroom;Two people to help with walking and/or transfers;Assistance with cooking/housework;Assistance with feeding;Direct supervision/assist for medications management;Direct supervision/assist for financial management;Assist for transportation;Help with stairs or ramp for entrance   Can travel by private vehicle   No    Equipment Recommendations Hoyer lift  Recommendations for Other Services       Functional Status Assessment Patient has not had a recent decline in their functional status     Precautions / Restrictions Precautions Precautions: Fall Precaution Comments: L ischial wound Restrictions Weight Bearing Restrictions Per Provider Order: No      Mobility  Bed Mobility Overal bed mobility: Needs Assistance Bed Mobility: Supine to Sit, Sit to Supine     Supine to sit: Total assist, +2 for safety/equipment, +2 for physical assistance Sit to supine: Total assist,  +2 for physical assistance, +2 for safety/equipment        Transfers                   General transfer comment: NT    Ambulation/Gait                  Stairs            Wheelchair Mobility     Tilt Bed    Modified Rankin (Stroke Patients Only)       Balance                                             Pertinent Vitals/Pain Pain Assessment Pain Assessment: Faces Faces Pain Scale: Hurts worst Pain Location: generalized with any type of movement Pain Descriptors / Indicators: Grimacing, Moaning Pain Intervention(s): Limited activity within patient's tolerance, Monitored during session, Repositioned    Home Living Family/patient expects to be discharged to:: Private residence Living Arrangements: Alone Available Help at Discharge: Family;Other (Comment);Personal care attendant (son reports he lives behind her and checks on her every house. Has aides who come 4 hour blocks each day, however per RN, aides have not been coming recently. Unsure of accuracy) Type of Home: House           Home Equipment: Hospital bed;Wheelchair - manual      Prior Function Prior Level of Function : Needs assist             Mobility Comments: nonambulatory at baseline. Son performs totalA transfer from bed<>w/c<>recliner ADLs Comments: totalA  Extremity/Trunk Assessment   Upper Extremity Assessment Upper Extremity Assessment: Generalized weakness    Lower Extremity Assessment Lower Extremity Assessment: Generalized weakness    Cervical / Trunk Assessment Cervical / Trunk Assessment: Kyphotic  Communication   Communication Communication: Difficulty following commands/understanding;Difficulty communicating thoughts/reduced clarity of speech  Cognition Arousal: Alert Behavior During Therapy: Flat affect Overall Cognitive Status: Impaired/Different from baseline Area of Impairment: Orientation, Following commands, Awareness,  Memory, Problem solving                 Orientation Level: Person, Disoriented to, Place, Time, Situation   Memory: Decreased recall of precautions, Decreased short-term memory Following Commands: Follows one step commands inconsistently   Awareness: Intellectual Problem Solving: Slow processing, Decreased initiation, Difficulty sequencing, Requires verbal cues, Requires tactile cues General Comments: oriented to name only. Unable to get correct birthdate        General Comments      Exercises     Assessment/Plan    PT Assessment Patient does not need any further PT services  PT Problem List         PT Treatment Interventions      PT Goals (Current goals can be found in the Care Plan section)  Acute Rehab PT Goals Patient Stated Goal: did not state PT Goal Formulation: All assessment and education complete, DC therapy    Frequency       Co-evaluation               AM-PAC PT 6 Clicks Mobility  Outcome Measure Help needed turning from your back to your side while in a flat bed without using bedrails?: Total Help needed moving from lying on your back to sitting on the side of a flat bed without using bedrails?: Total Help needed moving to and from a bed to a chair (including a wheelchair)?: Total Help needed standing up from a chair using your arms (e.g., wheelchair or bedside chair)?: Total Help needed to walk in hospital room?: Total Help needed climbing 3-5 steps with a railing? : Total 6 Click Score: 6    End of Session   Activity Tolerance: Patient limited by pain Patient left: in bed;with call bell/phone within reach;with family/visitor present Nurse Communication: Mobility status PT Visit Diagnosis: Muscle weakness (generalized) (M62.81)    Time: 8491-8472 PT Time Calculation (min) (ACUTE ONLY): 19 min   Charges:   PT Evaluation $PT Eval Moderate Complexity: 1 Mod   PT General Charges $$ ACUTE PT VISIT: 1 Visit         Maryanne Finder, PT, DPT Physical Therapist - [facility]  [facility] [place]   Henderson Frampton A Rosalea Withrow 09/08/2023, 3:42 PM

## 2023-09-08 NOTE — ED Notes (Signed)
 Wet to dry dressing replaced.

## 2023-09-08 NOTE — Assessment & Plan Note (Addendum)
 Dehydration Suspect ATN from dehydration related to poor oral intake -IV hydration -Monitor renal function and avoid nephrotoxins

## 2023-09-08 NOTE — Procedures (Signed)
 Procedure Note  Procedure: Incision and drainage of left posterior thigh abscess  Simple informed consent was obtained from the patient and her son.  The left thigh was then cleansed with Betadine .  Patient comfort was obtained with injection of 10 mL of 1% lidocaine .  A hemostat was used to identify the area of tracking of the wound into additional abscess cavity.  An incision was made over where this tracks down into the abscess cavity. Sanguinopurulent fluid was expressed.  All loculations were broken up inferomedially and towards where the original wound was with blunt finger dissection and a hemostat.  A Kerlix was then tunneled in the wound and the old wound was connected with the new incision for adequate drainage.  The wound was then dressed with gauze and ABD pads.  Prior to termination of the procedure I did collect culture swabs from the deep wound.  The patient tolerated the procedure well.

## 2023-09-08 NOTE — ED Notes (Signed)
 Clarified medication orders with Admiting Dr Para March. Per MD RN to continue LR fluids until liter complete and then transition pt to D5NS.

## 2023-09-08 NOTE — Assessment & Plan Note (Signed)
 No complaints of chest pain Continue Imdur and aspirin

## 2023-09-08 NOTE — Hospital Course (Signed)
 Danielle [name]

## 2023-09-08 NOTE — Assessment & Plan Note (Signed)
 Bedbound status secondary to osteoarthritis Advanced age and frailty/functional quadriplegia Patient is total care dependent Patient receives knee injections due to chronic pain Continue naproxen, acetaminophen , cyclobenzaprine  PT and TOC and dietary consults

## 2023-09-08 NOTE — ED Notes (Signed)
 Head to toe on this Rns shift

## 2023-09-08 NOTE — ED Notes (Signed)
 Pt admitted to [facility]

## 2023-09-08 NOTE — Progress Notes (Signed)
 Pharmacy Antibiotic Note  Danielle [name] is a 89 y.o. female admitted on 09/07/2023 with  wound infection .  Pharmacy has been consulted for Vancomycin  dosing.  Pt is AKI due to dehydration, will dose by level until renal function stable  Plan: Vancomycin  1 gm IV X 1 given in ED on 1/5 @ 2336.  Additional Vanc 500 mg IV X 1 ordered to make total loading dose of 1500 mg. - Pt is AKI so will dose by levels - Will draw Vanc level on 1/7 @ 0000 - Goal trough:  15 - 20 mcg/mL   Height: 5' 3 (160 cm) Weight: 66.7 kg (147 lb) IBW/kg (Calculated) : 52.4  Temp (24hrs), Avg:98.1 F (36.7 C), Min:97.6 F (36.4 C), Max:98.4 F (36.9 C)  Recent Labs  Lab 09/07/23 1915 09/07/23 2244  WBC 9.6  --   CREATININE 1.44*  --   LATICACIDVEN  --  1.5    Estimated Creatinine Clearance: 22.9 mL/min (A) (by C-G formula based on SCr of 1.44 mg/dL (H)).    Allergies  Allergen Reactions   Ace Inhibitors Other (See Comments)    Angiodema to Ace-inhibitor 2013   Penicillins Rash    SEVERE RASH. Has patient had a PCN reaction causing immediate rash, facial/tongue/throat swelling, SOB or lightheadedness with hypotension: Unknown Has patient had a PCN reaction causing severe rash involving mucus membranes or skin necrosis: Unknown Has patient had a PCN reaction that required hospitalization: Unknown Has patient had a PCN reaction occurring within the last 10 years: Unknown If all of the above answers are NO, then may proceed with Cephalosporin use. Unknown    Penicillin V Potassium Nausea And Vomiting and Rash    Antimicrobials this admission:   >>    >>   Dose adjustments this admission:   Microbiology results:  BCx:   UCx:    Sputum:    MRSA PCR:   Thank you for allowing pharmacy to be a part of this patient's care.  Marlei Glomski D 09/08/2023 3:01 AM

## 2023-09-08 NOTE — Assessment & Plan Note (Signed)
 Continue levothyroxine

## 2023-09-08 NOTE — Assessment & Plan Note (Signed)
 Suspect chronic blood loss from wound Anemia panel Monitor H&H

## 2023-09-08 NOTE — ED Notes (Signed)
 Patient given a breakfast tray. Pt's family at the bedside feeding patient.

## 2023-09-08 NOTE — Assessment & Plan Note (Signed)
 Hold HCTZ due to dehydration

## 2023-09-08 NOTE — ED Notes (Signed)
 Patient repeatedly pulls off all monitor equipment.

## 2023-09-09 ENCOUNTER — Encounter: Payer: Self-pay | Admitting: Internal Medicine

## 2023-09-09 DIAGNOSIS — N179 Acute kidney failure, unspecified: Secondary | ICD-10-CM | POA: Diagnosis not present

## 2023-09-09 DIAGNOSIS — S31000A Unspecified open wound of lower back and pelvis without penetration into retroperitoneum, initial encounter: Secondary | ICD-10-CM | POA: Diagnosis not present

## 2023-09-09 DIAGNOSIS — G9341 Metabolic encephalopathy: Secondary | ICD-10-CM | POA: Diagnosis not present

## 2023-09-09 DIAGNOSIS — E43 Unspecified severe protein-calorie malnutrition: Secondary | ICD-10-CM | POA: Diagnosis present

## 2023-09-09 LAB — BASIC METABOLIC PANEL
Anion gap: 10 (ref 5–15)
BUN: 22 mg/dL (ref 8–23)
CO2: 19 mmol/L — ABNORMAL LOW (ref 22–32)
Calcium: 8.8 mg/dL — ABNORMAL LOW (ref 8.9–10.3)
Chloride: 105 mmol/L (ref 98–111)
Creatinine, Ser: 0.93 mg/dL (ref 0.44–1.00)
GFR, Estimated: 58 mL/min — ABNORMAL LOW (ref >60)
Glucose, Bld: 102 mg/dL — ABNORMAL HIGH (ref 70–99)
Potassium: 4.7 mmol/L (ref 3.5–5.1)
Sodium: 134 mmol/L — ABNORMAL LOW (ref 135–145)

## 2023-09-09 LAB — CBC WITH DIFFERENTIAL/PLATELET
Abs Immature Granulocytes: 0.06 10*3/uL (ref 0.00–0.07)
Basophils Absolute: 0 10*3/uL (ref 0.0–0.1)
Basophils Relative: 0 %
Eosinophils Absolute: 0 10*3/uL (ref 0.0–0.5)
Eosinophils Relative: 0 %
HCT: 25 % — ABNORMAL LOW (ref 36.0–46.0)
Hemoglobin: 8.1 g/dL — ABNORMAL LOW (ref 12.0–15.0)
Immature Granulocytes: 1 %
Lymphocytes Relative: 9 %
Lymphs Abs: 0.5 10*3/uL — ABNORMAL LOW (ref 0.7–4.0)
MCH: 31.3 pg (ref 26.0–34.0)
MCHC: 32.4 g/dL (ref 30.0–36.0)
MCV: 96.5 fL (ref 80.0–100.0)
Monocytes Absolute: 0.7 10*3/uL (ref 0.1–1.0)
Monocytes Relative: 12 %
Neutro Abs: 4.6 10*3/uL (ref 1.7–7.7)
Neutrophils Relative %: 78 %
Platelets: 186 10*3/uL (ref 150–400)
RBC: 2.59 MIL/uL — ABNORMAL LOW (ref 3.87–5.11)
RDW: 16.2 % — ABNORMAL HIGH (ref 11.5–15.5)
WBC: 5.9 10*3/uL (ref 4.0–10.5)
nRBC: 0 % (ref 0.0–0.2)

## 2023-09-09 MED ORDER — ENOXAPARIN SODIUM 40 MG/0.4ML IJ SOSY
40.0000 mg | PREFILLED_SYRINGE | INTRAMUSCULAR | Status: DC
  Administered 2023-09-10 – 2023-09-30 (×21): 40 mg via SUBCUTANEOUS
  Filled 2023-09-09 (×20): qty 0.4

## 2023-09-09 MED ORDER — ENSURE ENLIVE PO LIQD: 237.0000 mL | Freq: Two times a day (BID) | ORAL | Status: DC

## 2023-09-09 MED ORDER — ENSURE ENLIVE PO LIQD
237.0000 mL | Freq: Three times a day (TID) | ORAL | Status: DC
  Administered 2023-09-09 – 2023-09-30 (×63): 237 mL via ORAL

## 2023-09-09 MED ORDER — VANCOMYCIN HCL 750 MG/150ML IV SOLN
750.0000 mg | INTRAVENOUS | Status: DC
  Administered 2023-09-09 – 2023-09-12 (×4): 750 mg via INTRAVENOUS
  Filled 2023-09-09 (×4): qty 150

## 2023-09-09 MED ORDER — VITAMIN C 500 MG PO TABS
500.0000 mg | ORAL_TABLET | Freq: Two times a day (BID) | ORAL | Status: DC
  Administered 2023-09-09 – 2023-09-30 (×42): 500 mg via ORAL
  Filled 2023-09-09 (×41): qty 1

## 2023-09-09 MED ORDER — ADULT MULTIVITAMIN W/MINERALS CH
1.0000 | ORAL_TABLET | Freq: Every day | ORAL | Status: DC
  Administered 2023-09-10 – 2023-09-30 (×21): 1 via ORAL
  Filled 2023-09-09 (×21): qty 1

## 2023-09-09 NOTE — Progress Notes (Signed)
 Progress Note   Patient: Danielle [name] FMW:[id number] DOB: [birth year] DOA: 09/07/2023     1 DOS: the patient was seen and examined on 09/09/2023    Brief hospital course: Danielle [name] is a 89 y.o. female with medical history significant for HTN, CAD, hypothyroidism, depression, COPD, OSA on CPAP, osteoarthritis, nonambulant at baseline and total care dependent with unknown left ischial sacral decubitus, managed by home health nurse who was brought to the ED with a 1 week history of weakness and in the past 2 days, altered mental status described as talking out of her head.  History is taken from her daughter at bedside.  Daughter states that she also noticed that over the past 2 days who also has had a foul-smelling drainage as well as bleeding.  Patient has no history of dementia though she has been getting forgetful of late but is awake and alert at baseline.  She lives alone but has an engineer, production and a son that lives behind her that checks in on her. ED course and data review: Vitals for the most part within normal limits Notable findings on workup include the following: Normal WBC of 9600 with lactic acid 1.5 Negative respiratory viral panel Hemoglobin 8.6 with no recent baseline Creatinine 1.44, most recent 0.99 a couple years prior Urinalysis with 5 ketones and without evidence of infection EKG, personally viewed and interpreted showing NSR at 69 with no acute ST-T wave changes CT head nonacute Chest x-ray blunting of right costophrenic angle   Patient treated with Rocephin  and vancomycin  for infected decubitus ulcer and given a fluid bolus Hospitalist consulted for admission.      Assessment and Plan:   * Infected decubitus ulcer, buttocks abscess Patient is not septic at this time Continue Rocephin  and vancomycin , to de-escalate based on culture results General Surgery on board we appreciate input   Chronic anemia Suspect chronic blood loss from wound Anemia panel Monitor  H&H   AKI (acute kidney injury) (HCC) Dehydration Suspect ATN from dehydration related to poor oral intake Continue hydration -Monitor renal function and avoid nephrotoxins   Acute metabolic encephalopathy-improved Likely secondary to acute infection and dehydration Recent forgetfulness but no history of dementia, per daughter Continue delirium precaution   Osteoarthritis of knees, bilateral Bedbound status secondary to osteoarthritis Advanced age and frailty/functional quadriplegia Patient is total care dependent Patient receives knee injections due to chronic pain Continue naproxen, acetaminophen , cyclobenzaprine  Continue PT OT   OSA on CPAP Continue CPAP nightly   Depression Continue sertraline    Hypothyroidism, unspecified Continue levothyroxine    Chronic obstructive pulmonary disease (HCC) Not acutely exacerbated Continue albuterol  as needed Patient follows with pulmonology, last seen October 2024   Hypertension Continue to hold HCTZ due to dehydration   Coronary arteriosclerosis in native artery No complaints of chest pain Continue Imdur  and aspirin      DVT prophylaxis: Lovenox    Consults: none   Advance Care Planning: DNR/DNI   Family Communication: Daughter, [name]   Disposition Plan: Back to previous home environment       Subjective:  Seen and examined this morning Denies worsening shortness of breath chest pain or cough Denies worsening pain at the site of the wound   Physical Exam:   Vitals and nursing note reviewed.  Constitutional:      General: She is sleeping. She is not in acute distress. HENT:     Head: Normocephalic and atraumatic.  Cardiovascular:     Rate and Rhythm: Normal rate and regular rhythm.  Heart sounds: Normal heart sounds.  Pulmonary:     Effort: Pulmonary effort is normal.     Breath sounds: Normal breath sounds.  Abdominal:     Palpations: Abdomen is soft.     Tenderness: There is no abdominal  tenderness.  Skin: Dressing noted to the buttocks area with some staining from drainage noted Neurological:     General: No focal deficit present.     Mental Status: She is easily aroused.   Data Reviewed:    Latest Ref Rng & Units 09/09/2023    4:35 AM 09/08/2023    3:47 AM 09/07/2023    7:15 PM  BMP  Glucose 70 - 99 mg/dL 897  79  90   BUN 8 - 23 mg/dL 22  38  38   Creatinine 0.44 - 1.00 mg/dL 9.06  8.55  8.55   Sodium 135 - 145 mmol/L 134  136  135   Potassium 3.5 - 5.1 mmol/L 4.7  4.9  4.9   Chloride 98 - 111 mmol/L 105  108  103   CO2 22 - 32 mmol/L 19  16  19    Calcium 8.9 - 10.3 mg/dL 8.8  8.8  9.7        Latest Ref Rng & Units 09/09/2023    4:35 AM 09/08/2023    4:49 AM 09/07/2023    7:15 PM  CBC  WBC 4.0 - 10.5 K/uL 5.9  8.1  9.6   Hemoglobin 12.0 - 15.0 g/dL 8.1  7.9  8.6   Hematocrit 36.0 - 46.0 % 25.0  24.8  25.4   Platelets 150 - 400 K/uL 186  114  198     Vitals:   09/09/23 0600 09/09/23 0715 09/09/23 0819 09/09/23 1437  BP: 129/89  (!) 141/57 100/67  Pulse: 64 76 80 86  Resp: (!) 23 16 18 18   Temp: 98.2 F (36.8 C)  98.2 F (36.8 C) 98.3 F (36.8 C)  TempSrc:   Axillary   SpO2: 100% 100% 100% 91%  Weight:      Height:         Author: Drue ONEIDA Potter, MD 09/09/2023 5:07 PM  For on call review [email or web address].

## 2023-09-09 NOTE — Progress Notes (Signed)
 Initial Nutrition Assessment  DOCUMENTATION CODES:   Severe malnutrition in context of chronic illness  INTERVENTION:   Ensure Enlive po TID, each supplement provides 350 kcal and 20 grams of protein.  Magic cup TID with meals, each supplement provides 290 kcal and 9 grams of protein  MVI po daily   Vitamin 500mg  po BID  Dysphagia 3 diet   Pt at high refeed risk; recommend monitor potassium, magnesium  and phosphorus labs daily until stable  Daily weights   NUTRITION DIAGNOSIS:   Severe Malnutrition related to chronic illness as evidenced by severe fat depletion, severe muscle depletion.  GOAL:   Patient will meet greater than or equal to 90% of their needs  MONITOR:   PO intake, Supplement acceptance, Labs, Weight trends, I & O's, Skin  REASON FOR ASSESSMENT:   Consult Assessment of nutrition requirement/status  ASSESSMENT:   89 y/o female with h/o HTN, COPD, hypothyroidism, depression, anxiety, OSA, bedbound with functional quadriplegia, CAD, HLD, MI and chronic left buttock wound who is admitted with left gluteal abscess, AKI, AMS and anemia now s/p I & D 1/6.  Met with pt in room today. Wound care at bedside. Pt reports poor appetite and oral intake at baseline. Pt reports that she mainly drinks Ensure at home; this is confirmed by pt's son. Pt denies any issues with chewing or swallowing. Per chart, pt is down 100lbs since 2022; pt endorses weight loss but reports that wt loss has been over the past couple of months. RD discussed with pt the importance of adequate nutrition needed to preserve lean muscle and support wound healing. Pt is agreeable to continue Ensure. RD will add Magic Cups to meal trays. Pt is likely at high refeed risk.      Medications reviewed and include: lovenox , synthroid , ceftriaxone , vancomycin    Labs reviewed: Na 134(L), K 4.7 wnl Hgb 8.1(L), Hct 25.0(L)  NUTRITION - FOCUSED PHYSICAL EXAM:  Flowsheet Row Most Recent Value  Orbital  Region Severe depletion  Upper Arm Region Severe depletion  Thoracic and Lumbar Region Severe depletion  Buccal Region Severe depletion  Temple Region Severe depletion  Clavicle Bone Region Severe depletion  Clavicle and Acromion Bone Region Severe depletion  Scapular Bone Region Severe depletion  Dorsal Hand Severe depletion  Patellar Region Severe depletion  Anterior Thigh Region Severe depletion  Posterior Calf Region Severe depletion  Edema (RD Assessment) None  Hair Reviewed  Eyes Reviewed  Mouth Reviewed  Skin Reviewed  Nails Reviewed   Diet Order:   Diet Order             DIET DYS 3 Room service appropriate? Yes with Assist; Fluid consistency: Thin  Diet effective now                  EDUCATION NEEDS:   Education needs have been addressed  Skin:  Skin Assessment: Reviewed RN Assessment (Stage III L buttocks 2X5X1cm, skin tears)  Last BM:  pta  Height:   Ht Readings from Last 1 Encounters:  09/07/23 5' 3 (1.6 m)    Weight:   Wt Readings from Last 1 Encounters:  09/07/23 66.7 kg    Ideal Body Weight:  52 kg  BMI:  Body mass index is 26.04 kg/m.  Estimated Nutritional Needs:   Kcal:  1600-1800kcal/day  Protein:  80-90g/day  Fluid:  1.4-1.6L/day  Augustin Shams MS, RD, LDN If unable to be reached, please send secure chat to RD inpatient available from 8:00a-4:00p daily

## 2023-09-09 NOTE — Progress Notes (Signed)
 Pharmacy Antibiotic Note  Danielle [name] is a 89 y.o. female admitted on 09/07/2023 with  wound infection .  Pharmacy has been consulted for Vancomycin  dosing.  Patient is also receiving Ceftriaxone .  Antibiotics Day 2. Patient's renal function has returned to baseline, will initiate scheduled Vancomycin  dosing.  Plan: Vancomycin  750 mg IV Q 24 hrs. Goal AUC 400-550. Expected AUC: 505.4 Expected Cmin: 14.7 SCr used: 0.93   Height: 5' 3 (160 cm) Weight: 66.7 kg (147 lb) IBW/kg (Calculated) : 52.4  Temp (24hrs), Avg:98.1 F (36.7 C), Min:97.9 F (36.6 C), Max:98.5 F (36.9 C)  Recent Labs  Lab 09/07/23 1915 09/07/23 2244 09/08/23 0347 09/08/23 0449 09/09/23 0435  WBC 9.6  --   --  8.1 5.9  CREATININE 1.44*  --  1.44*  --  0.93  LATICACIDVEN  --  1.5  --   --   --     Estimated Creatinine Clearance: 35.4 mL/min (by C-G formula based on SCr of 0.93 mg/dL).    Allergies  Allergen Reactions   Ace Inhibitors Other (See Comments)    Angiodema to Ace-inhibitor 2013   Penicillins Rash    SEVERE RASH. Has patient had a PCN reaction causing immediate rash, facial/tongue/throat swelling, SOB or lightheadedness with hypotension: Unknown Has patient had a PCN reaction causing severe rash involving mucus membranes or skin necrosis: Unknown Has patient had a PCN reaction that required hospitalization: Unknown Has patient had a PCN reaction occurring within the last 10 years: Unknown If all of the above answers are NO, then may proceed with Cephalosporin use. Unknown    Penicillin V Potassium Nausea And Vomiting and Rash    Antimicrobials this admission: Vancomycin   1/6 >> Ceftriaxone  1/6  >>   Dose adjustments this admission: N/A  Microbiology results: 1/5 BCx:  1/5 abscess cx: pending(few GPC, GNR, not finalized)  Thank you for allowing pharmacy to be a part of this patient's care.  Akela Pocius A Rennie Hack 09/09/2023 8:03 AM

## 2023-09-09 NOTE — Progress Notes (Signed)
 [facility] SURGICAL PROGRESS NOTE  Hospital Day(s): 1.   Post op day(s): 1  Interval History:  Patient seen and examined No acute events or new complaints overnight.  Patient reports she is doing well Somewhat confused; alert to person and time No fever She is without leukocytosis; WBC 5.9K Hgb to 8.1; stable sCr normalized; 0.93; UO - unmeasured Cx with GPC in chains; GNR She is on Rocephin /Vancomycin   Vital signs in last 24 hours: [min-max] current  Temp:  [97.9 F (36.6 C)-98.5 F (36.9 C)] 98.2 F (36.8 C) (01/07 0600) Pulse Rate:  [64-120] 76 (01/07 0715) Resp:  [15-29] 16 (01/07 0715) BP: (127-155)/(49-89) 129/89 (01/07 0600) SpO2:  [87 %-100 %] 100 % (01/07 0715)     Height: 5' 3 (160 cm) Weight: 66.7 kg BMI (Calculated): 26.05   Intake/Output last 2 shifts:  No intake/output data recorded.   Physical Exam:  Constitutional: alert, cooperative and no distress  Respiratory: breathing non-labored at rest  Integumentary: Two I&D sites to the left buttock, packing removed. There continues to be purulent drainage from this. Finger fracture allows for connection of these two sites. The overlaying skin appears healthy and viable. Packing replaced.   Labs:     Latest Ref Rng & Units 09/09/2023    4:35 AM 09/08/2023    4:49 AM 09/07/2023    7:15 PM  CBC  WBC 4.0 - 10.5 K/uL 5.9  8.1  9.6   Hemoglobin 12.0 - 15.0 g/dL 8.1  7.9  8.6   Hematocrit 36.0 - 46.0 % 25.0  24.8  25.4   Platelets 150 - 400 K/uL 186  114  198       Latest Ref Rng & Units 09/09/2023    4:35 AM 09/08/2023    3:47 AM 09/07/2023    7:15 PM  CMP  Glucose 70 - 99 mg/dL 897  79  90   BUN 8 - 23 mg/dL 22  38  38   Creatinine 0.44 - 1.00 mg/dL 9.06  8.55  8.55   Sodium 135 - 145 mmol/L 134  136  135   Potassium 3.5 - 5.1 mmol/L 4.7  4.9  4.9   Chloride 98 - 111 mmol/L 105  108  103   CO2 22 - 32 mmol/L 19  16  19    Calcium 8.9 - 10.3 mg/dL 8.8  8.8  9.7   Total Protein 6.5 - 8.1 g/dL    6.8   Total Bilirubin 0.0 - 1.2 mg/dL   0.9   Alkaline Phos 38 - 126 U/L   64   AST 15 - 41 U/L   36   ALT 0 - 44 U/L   19     Imaging studies: No new pertinent imaging studies   Assessment/Plan:  89 y.o. female with left gluteal abscess s/p I&D   - Continue wound care: Utilizing saline moistened rolled gauze, pack wound connecting two current I&D wounds to allow for better drainage. Cover with dry gauze/ABD pads and secure. This should continue to be done BID and as needed  - Continue IV Abx (rocephin /Vancomycin ); narrow as Cx results   - Pain control prn   - Pressure offload - Further management per primary service; we will follow    All of the above findings and recommendations were discussed with the patient, and the medical team, and all of patient's questions were answered to her expressed satisfaction.  -- Arthea Platt, PA-C [facility] 09/09/2023, 7:50 AM M-F:  7am - 4pm

## 2023-09-09 NOTE — ED Notes (Signed)
 Pt with bloody drainage noted to wound on buttocks. Dressing changed. Linens, brief, gown changed with Zachery Dakins, NT.

## 2023-09-10 DIAGNOSIS — G9341 Metabolic encephalopathy: Secondary | ICD-10-CM | POA: Diagnosis not present

## 2023-09-10 DIAGNOSIS — S31000A Unspecified open wound of lower back and pelvis without penetration into retroperitoneum, initial encounter: Secondary | ICD-10-CM | POA: Diagnosis not present

## 2023-09-10 DIAGNOSIS — N179 Acute kidney failure, unspecified: Secondary | ICD-10-CM | POA: Diagnosis not present

## 2023-09-10 LAB — CBC WITH DIFFERENTIAL/PLATELET
Abs Immature Granulocytes: 0.08 10*3/uL — ABNORMAL HIGH (ref 0.00–0.07)
Basophils Absolute: 0 10*3/uL (ref 0.0–0.1)
Basophils Relative: 0 %
Eosinophils Absolute: 0 10*3/uL (ref 0.0–0.5)
Eosinophils Relative: 0 %
HCT: 27.3 % — ABNORMAL LOW (ref 36.0–46.0)
Hemoglobin: 9.2 g/dL — ABNORMAL LOW (ref 12.0–15.0)
Immature Granulocytes: 1 %
Lymphocytes Relative: 6 %
Lymphs Abs: 0.5 10*3/uL — ABNORMAL LOW (ref 0.7–4.0)
MCH: 31.2 pg (ref 26.0–34.0)
MCHC: 33.7 g/dL (ref 30.0–36.0)
MCV: 92.5 fL (ref 80.0–100.0)
Monocytes Absolute: 0.6 10*3/uL (ref 0.1–1.0)
Monocytes Relative: 7 %
Neutro Abs: 7 10*3/uL (ref 1.7–7.7)
Neutrophils Relative %: 86 %
Platelets: 202 10*3/uL (ref 150–400)
RBC: 2.95 MIL/uL — ABNORMAL LOW (ref 3.87–5.11)
RDW: 16.1 % — ABNORMAL HIGH (ref 11.5–15.5)
WBC: 8.1 10*3/uL (ref 4.0–10.5)
nRBC: 0 % (ref 0.0–0.2)

## 2023-09-10 LAB — BASIC METABOLIC PANEL
Anion gap: 10 (ref 5–15)
BUN: 20 mg/dL (ref 8–23)
CO2: 24 mmol/L (ref 22–32)
Calcium: 9.4 mg/dL (ref 8.9–10.3)
Chloride: 107 mmol/L (ref 98–111)
Creatinine, Ser: 0.85 mg/dL (ref 0.44–1.00)
GFR, Estimated: >60 mL/min (ref >60)
Glucose, Bld: 85 mg/dL (ref 70–99)
Potassium: 3.8 mmol/L (ref 3.5–5.1)
Sodium: 140 mmol/L (ref 135–145)

## 2023-09-10 LAB — PHOSPHORUS: Phosphorus: 2.2 mg/dL — ABNORMAL LOW (ref 2.5–4.6)

## 2023-09-10 LAB — MAGNESIUM: Magnesium: 1.8 mg/dL (ref 1.7–2.4)

## 2023-09-10 MED ORDER — K PHOS MONO-SOD PHOS DI & MONO 155-852-130 MG PO TABS
500.0000 mg | ORAL_TABLET | ORAL | Status: AC
  Administered 2023-09-10 (×2): 500 mg via ORAL
  Filled 2023-09-10 (×2): qty 2

## 2023-09-10 NOTE — Progress Notes (Signed)
 Progress Note   Patient: Danielle [name] FMW:[id number] DOB: 02/20/1929 DOA: 09/07/2023     2 DOS: the patient was seen and examined on 09/10/2023      Brief hospital course: Danielle [name] is a 89 y.o. female with medical history significant for HTN, CAD, hypothyroidism, depression, COPD, OSA on CPAP, osteoarthritis, nonambulant at baseline and total care dependent with unknown left ischial sacral decubitus, managed by home health nurse who was brought to the ED with a 1 week history of weakness and in the past 2 days, altered mental status described as talking out of her head.  History is taken from her daughter at bedside.  Daughter states that she also noticed that over the past 2 days who also has had a foul-smelling drainage as well as bleeding.  Patient has no history of dementia though she has been getting forgetful of late but is awake and alert at baseline.  She lives alone but has an engineer, production and a son that lives behind her that checks in on her. ED course and data review: Vitals for the most part within normal limits Notable findings on workup include the following: Normal WBC of 9600 with lactic acid 1.5 Negative respiratory viral panel Hemoglobin 8.6 with no recent baseline Creatinine 1.44, most recent 0.99 a couple years prior Urinalysis with 5 ketones and without evidence of infection EKG, personally viewed and interpreted showing NSR at 69 with no acute ST-T wave changes CT head nonacute Chest x-ray blunting of right costophrenic angle   Patient treated with Rocephin  and vancomycin  for infected decubitus ulcer and given a fluid bolus Hospitalist consulted for admission.      Assessment and Plan:   * Infected decubitus ulcer, buttocks abscess Patient is not septic at this time Continue Rocephin  and vancomycin , to de-escalate based on culture results General Surgery on board we appreciate input   Chronic anemia Suspect chronic blood loss from wound Anemia panel Monitor  H&H   AKI (acute kidney injury) (HCC) Dehydration Suspect ATN from dehydration related to poor oral intake Continue hydration -Monitor renal function and avoid nephrotoxins   Acute metabolic encephalopathy-improved Likely secondary to acute infection and dehydration Recent forgetfulness but no history of dementia, per daughter Continue delirium precaution   Osteoarthritis of knees, bilateral Bedbound status secondary to osteoarthritis Advanced age and frailty/functional quadriplegia Patient is total care dependent Patient receives knee injections due to chronic pain Continue naproxen, acetaminophen , cyclobenzaprine  Continue PT OT   OSA on CPAP Continue CPAP nightly   Depression Continue sertraline    Hypothyroidism, unspecified Continue levothyroxine    Chronic obstructive pulmonary disease (HCC) Not acutely exacerbated Continue albuterol  as needed Patient follows with pulmonology, last seen October 2024   Hypertension Continue to hold HCTZ due to dehydration   Coronary arteriosclerosis in native artery No complaints of chest pain Continue Imdur  and aspirin      DVT prophylaxis: Lovenox    Consults: none   Advance Care Planning: DNR/DNI   Family Communication: Daughter, Danielle [name]   Disposition Plan: Back to previous home environment    Subjective:  Seen and examined this morning Denies worsening shortness of breath chest pain or cough Denies worsening pain at the site of the wound   Physical Exam:   Vitals and nursing note reviewed.  Constitutional:      General: She is sleeping. She is not in acute distress. HENT:     Head: Normocephalic and atraumatic.  Cardiovascular:     Rate and Rhythm: Normal rate and regular rhythm.  Heart sounds: Normal heart sounds.  Pulmonary:     Effort: Pulmonary effort is normal.     Breath sounds: Normal breath sounds.  Abdominal:     Palpations: Abdomen is soft.     Tenderness: There is no abdominal tenderness.   Skin: Dressing noted to the buttocks area with some staining from drainage noted Neurological:     General: No focal deficit present.     Mental Status: She is easily aroused.    Data Reviewed:    Latest Ref Rng & Units 09/10/2023    5:59 AM 09/09/2023    4:35 AM 09/08/2023    4:49 AM  CBC  WBC 4.0 - 10.5 K/uL 8.1  5.9  8.1   Hemoglobin 12.0 - 15.0 g/dL 9.2  8.1  7.9   Hematocrit 36.0 - 46.0 % 27.3  25.0  24.8   Platelets 150 - 400 K/uL 202  186  114        Latest Ref Rng & Units 09/10/2023    5:59 AM 09/09/2023    4:35 AM 09/08/2023    3:47 AM  BMP  Glucose 70 - 99 mg/dL 85  897  79   BUN 8 - 23 mg/dL 20  22  38   Creatinine 0.44 - 1.00 mg/dL 9.14  9.06  8.55   Sodium 135 - 145 mmol/L 140  134  136   Potassium 3.5 - 5.1 mmol/L 3.8  4.7  4.9   Chloride 98 - 111 mmol/L 107  105  108   CO2 22 - 32 mmol/L 24  19  16    Calcium 8.9 - 10.3 mg/dL 9.4  8.8  8.8     Vitals:   09/09/23 2104 09/10/23 0500 09/10/23 0545 09/10/23 1551  BP: 92/67  (!) 153/48 (!) 122/38  Pulse: 64  66 72  Resp: 18  18 20   Temp: 98.2 F (36.8 C)  98.8 F (37.1 C) 97.7 F (36.5 C)  TempSrc: Oral  Oral Oral  SpO2: 93%  98% 100%  Weight:  70.1 kg    Height:         Author: Drue ONEIDA Potter, MD 09/10/2023 4:44 PM  For on call review [email or web address].

## 2023-09-10 NOTE — Progress Notes (Signed)
 [facility] SURGICAL PROGRESS NOTE  Hospital Day(s): 2.   Post op day(s): 2  Interval History:  Patient seen and examined No acute events or new complaints overnight.  Patient reports she is doing well this morning She is much more alert and oriented this morning  No fever She is without leukocytosis; WBC 8.1K Hgb to 9.2; improved sCr normalized; 0.85; UO - 300 ccs + unmeasured Cx with GPC in chains and pairs; GNR She is on Rocephin /Vancomycin   Vital signs in last 24 hours: [min-max] current  Temp:  [98.2 F (36.8 C)-98.8 F (37.1 C)] 98.8 F (37.1 C) (01/08 0545) Pulse Rate:  [64-86] 66 (01/08 0545) Resp:  [18] 18 (01/08 0545) BP: (92-153)/(48-67) 153/48 (01/08 0545) SpO2:  [91 %-100 %] 98 % (01/08 0545) Weight:  [70.1 kg] 70.1 kg (01/08 0500)     Height: 5' 3 (160 cm) Weight: 70.1 kg BMI (Calculated): 27.38   Intake/Output last 2 shifts:  01/07 0701 - 01/08 0700 In: 0  Out: 300 [Urine:300]   Physical Exam:  Constitutional: alert, cooperative and no distress  Respiratory: breathing non-labored at rest  Integumentary: Two I&D sites to the left buttock, packing removed. There continues to be purulent drainage from this; but much left. These two sites remain connected. The overlaying skin appears healthy and viable. Packing replaced.   Labs:     Latest Ref Rng & Units 09/10/2023    5:59 AM 09/09/2023    4:35 AM 09/08/2023    4:49 AM  CBC  WBC 4.0 - 10.5 K/uL 8.1  5.9  8.1   Hemoglobin 12.0 - 15.0 g/dL 9.2  8.1  7.9   Hematocrit 36.0 - 46.0 % 27.3  25.0  24.8   Platelets 150 - 400 K/uL 202  186  114       Latest Ref Rng & Units 09/10/2023    5:59 AM 09/09/2023    4:35 AM 09/08/2023    3:47 AM  CMP  Glucose 70 - 99 mg/dL 85  897  79   BUN 8 - 23 mg/dL 20  22  38   Creatinine 0.44 - 1.00 mg/dL 9.14  9.06  8.55   Sodium 135 - 145 mmol/L 140  134  136   Potassium 3.5 - 5.1 mmol/L 3.8  4.7  4.9   Chloride 98 - 111 mmol/L 107  105  108   CO2 22 - 32 mmol/L  24  19  16    Calcium 8.9 - 10.3 mg/dL 9.4  8.8  8.8     Imaging studies: No new pertinent imaging studies   Assessment/Plan:  89 y.o. female with left gluteal abscess s/p I&D   - Continue wound care: Utilizing saline moistened rolled gauze, pack wound connecting two current I&D wounds to allow for better drainage. Cover with dry gauze/ABD pads and secure. This should continue to be done BID and as needed  - Continue IV Abx (rocephin /Vancomycin ); narrow as Cx results   - Pain control prn   - Pressure offload - Further management per primary service; we will follow    All of the above findings and recommendations were discussed with the patient, and the medical team, and all of patient's questions were answered to her expressed satisfaction.  -- Arthea Platt, PA-C [facility] 09/10/2023, 7:35 AM M-F: 7am - 4pm

## 2023-09-10 NOTE — Plan of Care (Signed)
  Problem: Education: Goal: Knowledge of General Education information will improve Description: Including pain rating scale, medication(s)/side effects and non-pharmacologic comfort measures Outcome: Progressing   Problem: Health Behavior/Discharge Planning: Goal: Ability to manage health-related needs will improve Outcome: Progressing   Problem: Health Behavior/Discharge Planning: Goal: Ability to manage health-related needs will improve Outcome: Progressing   Problem: Clinical Measurements: Goal: Ability to maintain clinical measurements within normal limits will improve Outcome: Progressing Goal: Will remain free from infection Outcome: Progressing Goal: Diagnostic test results will improve Outcome: Progressing Goal: Respiratory complications will improve Outcome: Progressing Goal: Cardiovascular complication will be avoided Outcome: Progressing   

## 2023-09-10 NOTE — Progress Notes (Addendum)
 While giving patient bed bath, noticed IV infiltration on right arm. Patient IV had been running at KVO of 10ml/hr since Vancomycin  (Vancoready) infusion completed at 1052. Infiltration site was edematous, erythema and tender to the touch. IV was removed, pharmacy notified Florentino Percy) recommendation to apply warm pack to affected area. Son present at bedside and updated about the infiltration.

## 2023-09-11 DIAGNOSIS — L089 Local infection of the skin and subcutaneous tissue, unspecified: Secondary | ICD-10-CM

## 2023-09-11 DIAGNOSIS — N179 Acute kidney failure, unspecified: Secondary | ICD-10-CM | POA: Diagnosis not present

## 2023-09-11 DIAGNOSIS — R532 Functional quadriplegia: Secondary | ICD-10-CM | POA: Diagnosis not present

## 2023-09-11 DIAGNOSIS — Z7401 Bed confinement status: Secondary | ICD-10-CM | POA: Diagnosis not present

## 2023-09-11 DIAGNOSIS — Z7189 Other specified counseling: Secondary | ICD-10-CM

## 2023-09-11 DIAGNOSIS — G9341 Metabolic encephalopathy: Secondary | ICD-10-CM | POA: Diagnosis not present

## 2023-09-11 DIAGNOSIS — E039 Hypothyroidism, unspecified: Secondary | ICD-10-CM

## 2023-09-11 DIAGNOSIS — E43 Unspecified severe protein-calorie malnutrition: Secondary | ICD-10-CM

## 2023-09-11 DIAGNOSIS — L8995 Pressure ulcer of unspecified site, unstageable: Secondary | ICD-10-CM

## 2023-09-11 LAB — MAGNESIUM: Magnesium: 1.9 mg/dL (ref 1.7–2.4)

## 2023-09-11 LAB — CBC WITH DIFFERENTIAL/PLATELET
Abs Immature Granulocytes: 0.07 10*3/uL (ref 0.00–0.07)
Basophils Absolute: 0 10*3/uL (ref 0.0–0.1)
Basophils Relative: 0 %
Eosinophils Absolute: 0 10*3/uL (ref 0.0–0.5)
Eosinophils Relative: 0 %
HCT: 21.3 % — ABNORMAL LOW (ref 36.0–46.0)
Hemoglobin: 7.1 g/dL — ABNORMAL LOW (ref 12.0–15.0)
Immature Granulocytes: 1 %
Lymphocytes Relative: 10 %
Lymphs Abs: 0.7 10*3/uL (ref 0.7–4.0)
MCH: 30.7 pg (ref 26.0–34.0)
MCHC: 33.3 g/dL (ref 30.0–36.0)
MCV: 92.2 fL (ref 80.0–100.0)
Monocytes Absolute: 0.5 10*3/uL (ref 0.1–1.0)
Monocytes Relative: 7 %
Neutro Abs: 5.6 10*3/uL (ref 1.7–7.7)
Neutrophils Relative %: 82 %
Platelets: 158 10*3/uL (ref 150–400)
RBC: 2.31 MIL/uL — ABNORMAL LOW (ref 3.87–5.11)
RDW: 16.4 % — ABNORMAL HIGH (ref 11.5–15.5)
WBC: 6.9 10*3/uL (ref 4.0–10.5)
nRBC: 0 % (ref 0.0–0.2)

## 2023-09-11 LAB — BASIC METABOLIC PANEL
Anion gap: 10 (ref 5–15)
BUN: 21 mg/dL (ref 8–23)
CO2: 21 mmol/L — ABNORMAL LOW (ref 22–32)
Calcium: 8.4 mg/dL — ABNORMAL LOW (ref 8.9–10.3)
Chloride: 105 mmol/L (ref 98–111)
Creatinine, Ser: 0.85 mg/dL (ref 0.44–1.00)
GFR, Estimated: >60 mL/min (ref >60)
Glucose, Bld: 91 mg/dL (ref 70–99)
Potassium: 3.3 mmol/L — ABNORMAL LOW (ref 3.5–5.1)
Sodium: 136 mmol/L (ref 135–145)

## 2023-09-11 LAB — PHOSPHORUS: Phosphorus: 3 mg/dL (ref 2.5–4.6)

## 2023-09-11 MED ORDER — DICLOFENAC SODIUM 1 % EX GEL
2.0000 g | Freq: Four times a day (QID) | CUTANEOUS | Status: DC
  Administered 2023-09-11 – 2023-09-30 (×66): 2 g via TOPICAL
  Filled 2023-09-11 (×4): qty 100

## 2023-09-11 MED ORDER — CEFEPIME HCL 2 G IV SOLR
2.0000 g | Freq: Two times a day (BID) | INTRAVENOUS | Status: DC
  Administered 2023-09-11 – 2023-09-12 (×2): 2 g via INTRAVENOUS
  Filled 2023-09-11 (×2): qty 12.5

## 2023-09-11 MED ORDER — POTASSIUM CHLORIDE CRYS ER 20 MEQ PO TBCR
40.0000 meq | EXTENDED_RELEASE_TABLET | Freq: Once | ORAL | Status: AC
  Administered 2023-09-11: 40 meq via ORAL
  Filled 2023-09-11: qty 2

## 2023-09-11 NOTE — Plan of Care (Signed)
  Problem: Education: Goal: Knowledge of General Education information will improve Description: Including pain rating scale, medication(s)/side effects and non-pharmacologic comfort measures Outcome: Progressing   Problem: Health Behavior/Discharge Planning: Goal: Ability to manage health-related needs will improve Outcome: Progressing   Problem: Clinical Measurements: Goal: Ability to maintain clinical measurements within normal limits will improve Outcome: Progressing Goal: Will remain free from infection Outcome: Progressing Goal: Diagnostic test results will improve Outcome: Progressing Goal: Respiratory complications will improve Outcome: Progressing Goal: Cardiovascular complication will be avoided Outcome: Progressing   Problem: Activity: Goal: Risk for activity intolerance will decrease Outcome: Progressing   Problem: Nutrition: Goal: Adequate nutrition will be maintained Outcome: Progressing   Problem: Coping: Goal: Level of anxiety will decrease Outcome: Progressing   Problem: Elimination: Goal: Will not experience complications related to bowel motility Outcome: Progressing Goal: Will not experience complications related to urinary retention Outcome: Progressing   Problem: Pain Management: Goal: General experience of comfort will improve Outcome: Progressing   Problem: Safety: Goal: Ability to remain free from injury will improve Outcome: Progressing   Problem: Skin Integrity: Goal: Risk for impaired skin integrity will decrease Outcome: Progressing   Problem: Clinical Measurements: Goal: Ability to avoid or minimize complications of infection will improve Outcome: Progressing   Problem: Skin Integrity: Goal: Skin integrity will improve Outcome: Progressing

## 2023-09-11 NOTE — Care Management Important Message (Signed)
 Important Message  Patient Details  Name: Danielle [name] MRN: [id number] Date of Birth: 1929/03/28   Important Message Given:  Yes - Medicare IM     Tishie Altmann W, CMA 09/11/2023, 4:51 PM

## 2023-09-11 NOTE — Consult Note (Signed)
 Consultation Note Date: 09/11/2023   Patient Name: Danielle [name]  DOB: 1929/01/12  MRN: [id number]  Age / Sex: 89 y.o., female  PCP: Tobie Domino, MD Referring Physician: Darci Pore, MD  Reason for Consultation: Establishing goals of care  HPI/Patient Profile: Danielle [name] is a 89 y.o. female with medical history significant for HTN, CAD, hypothyroidism, depression, COPD, OSA on CPAP, osteoarthritis, nonambulant at baseline and total care dependent with unknown left ischial sacral decubitus, managed by home health nurse who was brought to the ED with a 1 week history of weakness and in the past 2 days, altered mental status described as talking out of her head.  History is taken from her daughter at bedside.  Daughter states that she also noticed that over the past 2 days who also has had a foul-smelling drainage as well as bleeding.  Patient has no history of dementia though she has been getting forgetful of late but is awake and alert at baseline.  She lives alone but has an engineer, production and a son that lives behind her that checks in on her.   Clinical Assessment and Goals of Care: Notes and labs reviewed.  Into see patient.  She is currently resting in bed with son at bedside.  Son states patient has 2 children and is widowed.  He states an aide comes daily to get her out of the bed, put her in a wheelchair, roll her to a chair in the living room, place her in the chair, and then take her back again.   We discussed her diagnoses and scenarios moving forward.  Patient had a visitor who son wanted to visit with.  PMT advised I would follow-up tomorrow morning.    SUMMARY OF RECOMMENDATIONS   PMT will follow-up tomorrow  Prognosis:  Poor       Primary Diagnoses: Present on Admission:  Infected decubitus ulcer  Hypothyroidism, unspecified  Hypertension  Chronic obstructive pulmonary disease  (HCC)  Osteoarthritis of knees, bilateral  Coronary arteriosclerosis in native artery   I have reviewed the medical record, interviewed the patient and family, and examined the patient. The following aspects are pertinent.  Past Medical History:  Diagnosis Date   Anxiety    Arthritis    Depression    Dyspnea    with exertion   Headache    migraines   History of myocarditis    Hyperlipemia    Hypertension    Hypocholesterolemia    Hypothyroidism    Myocardial infarction (HCC) 2010   Sleep apnea    uses a cpap   Social History   Socioeconomic History   Marital status: Widowed    Spouse name: Not on file   Number of children: Not on file   Years of education: Not on file   Highest education level: Not on file  Occupational History   Not on file  Tobacco Use   Smoking status: Never   Smokeless tobacco: Never  Vaping Use   Vaping status: Never Used  Substance  and Sexual Activity   Alcohol use: No   Drug use: No   Sexual activity: Not Currently  Other Topics Concern   Not on file  Social History Narrative   Not on file   Social Drivers of Health   Financial Resource Strain: Not on file  Food Insecurity: No Food Insecurity (09/09/2023)   Hunger Vital Sign    Worried About Running Out of Food in the Last Year: Never true    Ran Out of Food in the Last Year: Never true  Transportation Needs: No Transportation Needs (09/09/2023)   PRAPARE - Administrator, Civil Service (Medical): No    Lack of Transportation (Non-Medical): No  Physical Activity: Not on file  Stress: Not on file  Social Connections: Socially Isolated (09/09/2023)   Social Connection and Isolation Panel [NHANES]    Frequency of Communication with Friends and Family: More than three times a week    Frequency of Social Gatherings with Friends and Family: More than three times a week    Attends Religious Services: Never    Database Administrator or Organizations: No    Attends Tax Inspector Meetings: Never    Marital Status: Widowed   Family History  Problem Relation Age of Onset   Bladder Cancer Neg Hx    Kidney cancer Neg Hx    Breast cancer Neg Hx    Scheduled Meds:  vitamin C   500 mg Oral BID   donepezil   5 mg Oral QHS   enoxaparin  (LOVENOX ) injection  40 mg Subcutaneous Q24H   feeding supplement  237 mL Oral TID BM   levothyroxine   150 mcg Oral Q0600   multivitamin with minerals  1 tablet Oral Daily   sertraline   50 mg Oral Daily   Continuous Infusions:  ceFEPime  (MAXIPIME ) IV     vancomycin  750 mg (09/11/23 0843)   PRN Meds:.acetaminophen  **OR** acetaminophen , cyclobenzaprine , HYDROcodone -acetaminophen , iohexol , ondansetron  **OR** ondansetron  (ZOFRAN ) IV Medications Prior to Admission:  Prior to Admission medications   Medication Sig Start Date End Date Taking? Authorizing Provider  acetaminophen  (TYLENOL ) 500 MG tablet 1,000 mg every 8 (eight) hours as needed for moderate pain.    Yes [provider]  alum & mag hydroxide-simeth (MAALOX PLUS) 400-400-40 MG/5ML suspension Take 15 mLs by mouth as needed for indigestion.    Yes [provider]  aspirin  EC 81 MG tablet Take 1 tablet by mouth daily. 11/28/14  Yes [provider]  celecoxib (CELEBREX) 200 MG capsule Take 200 mg by mouth daily. 04/23/23  Yes [provider]  diclofenac  Sodium (VOLTAREN ) 1 % GEL Apply 2 g topically 4 (four) times daily.   Yes [provider]  diphenhydrAMINE  (BENADRYL ) 25 mg capsule Take 25 mg by mouth at bedtime as needed for sleep.   Yes [provider]  isosorbide  mononitrate (IMDUR ) 60 MG 24 hr tablet Take 60 mg by mouth daily.   Yes [provider]  levothyroxine  (SYNTHROID ) 150 MCG tablet Take 150 mcg by mouth daily. 09/02/23  Yes [provider]  MYRBETRIQ  25 MG TB24 tablet Take 25 mg by mouth daily. 07/31/22  Yes [provider]  naproxen sodium (ALEVE) 220 MG tablet Take 220 mg by mouth  2 (two) times daily as needed (pain).   Yes [provider]  nitroGLYCERIN  (NITROSTAT ) 0.4 MG SL tablet Place 0.4 mg under the tongue every 5 (five) minutes as needed for chest pain.  01/22/17  Yes [provider]  potassium chloride  (K-DUR,KLOR-CON ) 10 MEQ tablet Take 10 mEq by mouth 2 (two) times daily.  03/02/18  Yes [provider]  sertraline  (ZOLOFT ) 50 MG tablet Take 50 mg by mouth daily. 08/18/23  Yes [provider]  traMADol  (ULTRAM ) 50 MG tablet Take 50 mg by mouth every 8 (eight) hours as needed for moderate pain (pain score 4-6). 09/02/23  Yes [provider]  senna (SENOKOT) 8.6 MG TABS tablet Take 1 tablet (8.6 mg total) by mouth daily. Patient not taking: Reported on 09/08/2023 02/27/21   Kandis Devaughn Sayres, MD   Allergies  Allergen Reactions   Ace Inhibitors Other (See Comments)    Angiodema to Ace-inhibitor 2013   Penicillins Rash    SEVERE RASH. Has patient had a PCN reaction causing immediate rash, facial/tongue/throat swelling, SOB or lightheadedness with hypotension: Unknown Has patient had a PCN reaction causing severe rash involving mucus membranes or skin necrosis: Unknown Has patient had a PCN reaction that required hospitalization: Unknown Has patient had a PCN reaction occurring within the last 10 years: Unknown If all of the above answers are NO, then may proceed with Cephalosporin use. Unknown    Penicillin V Potassium Nausea And Vomiting and Rash   Review of Systems  Unable to perform ROS   Physical Exam Constitutional:      Comments: Eyes closed  Pulmonary:     Effort: Pulmonary effort is normal.     Vital Signs: BP (!) 141/49 (BP Location: Left Arm)   Pulse 62   Temp 98 F (36.7 C)   Resp 18   Ht 5' 3 (1.6 m)   Wt 70.7 kg   SpO2 100%   BMI 27.61 kg/m  Pain Scale: PAINAD   Pain Score: Asleep   SpO2: SpO2: 100 % O2 Device:SpO2: 100 % O2 Flow Rate: .   IO: Intake/output summary:   Intake/Output Summary (Last 24 hours) at 09/11/2023 1644 Last data filed at 09/11/2023 1137 Gross per 24 hour  Intake 600 ml  Output --  Net 600 ml    LBM:   Baseline Weight: Weight: 66.7 kg Most recent weight: Weight: 70.7 kg        Signed by: Camelia Lewis, NP   Please contact Palliative Medicine Team phone at [phone] for questions and concerns.  For individual provider: See Tracey

## 2023-09-11 NOTE — Progress Notes (Signed)
 [facility] SURGICAL PROGRESS NOTE  Hospital Day(s): 3.   Post op day(s): 3  Interval History:  Patient seen and examined No acute events or new complaints overnight.  Patient very somnolent this AM; briefly arouses but quickly falls asleep  She is much more alert and oriented this morning  No fever She is without leukocytosis; WBC 6.9K Hgb to 7.1; ? Unclear source - question dilutional as there is a drop in all cell lines  sCr normalized; 0.85; UO - 200 ccs + unmeasured Hypokalemia to 3.3 Cx with GNR; Morganella Morganii, Enterococcus faecalis, streptococcus agalactiae  She is on Rocephin /Vancomycin   Vital signs in last 24 hours: [min-max] current  Temp:  [97.7 F (36.5 C)-98 F (36.7 C)] 98 F (36.7 C) (01/09 0759) Pulse Rate:  [67-72] 67 (01/09 0759) Resp:  [18-20] 18 (01/09 0759) BP: (122-126)/(38-56) 126/56 (01/09 0759) SpO2:  [100 %] 100 % (01/09 0759)     Height: 5' 3 (160 cm) Weight: 70.1 kg BMI (Calculated): 27.38   Intake/Output last 2 shifts:  01/08 0701 - 01/09 0700 In: 500 [IV Piggyback:500] Out: 200 [Urine:200]   Physical Exam:  Constitutional: alert, cooperative and no distress  Respiratory: breathing non-labored at rest  Integumentary: I&D dressing changed within last hour, no saturation. We will reassess formally later this afternoon.   Labs:     Latest Ref Rng & Units 09/11/2023    5:18 AM 09/10/2023    5:59 AM 09/09/2023    4:35 AM  CBC  WBC 4.0 - 10.5 K/uL 6.9  8.1  5.9   Hemoglobin 12.0 - 15.0 g/dL 7.1  9.2  8.1   Hematocrit 36.0 - 46.0 % 21.3  27.3  25.0   Platelets 150 - 400 K/uL 158  202  186       Latest Ref Rng & Units 09/11/2023    5:18 AM 09/10/2023    5:59 AM 09/09/2023    4:35 AM  CMP  Glucose 70 - 99 mg/dL 91  85  897   BUN 8 - 23 mg/dL 21  20  22    Creatinine 0.44 - 1.00 mg/dL 9.14  9.14  9.06   Sodium 135 - 145 mmol/L 136  140  134   Potassium 3.5 - 5.1 mmol/L 3.3  3.8  4.7   Chloride 98 - 111 mmol/L 105  107  105    CO2 22 - 32 mmol/L 21  24  19    Calcium 8.9 - 10.3 mg/dL 8.4  9.4  8.8     Imaging studies: No new pertinent imaging studies   Assessment/Plan:  89 y.o. female with left gluteal abscess s/p I&D   - Continue wound care: Utilizing saline moistened rolled gauze, pack wound connecting two current I&D wounds to allow for better drainage. Cover with dry gauze/ABD pads and secure. This should continue to be done BID and as needed  - Continue IV Abx (rocephin /Vancomycin ); narrow as Cx results   - Pain control prn   - Pressure offload - Further management per primary service; we will follow    All of the above findings and recommendations were discussed with the patient, and the medical team, and all of patient's questions were answered to her expressed satisfaction.  -- Arthea Platt, PA-C [facility] 09/11/2023, 8:03 AM M-F: 7am - 4pm

## 2023-09-11 NOTE — Progress Notes (Signed)
 Progress Note   Patient: Danielle [name] FMW:[id number] DOB: 05/23/1929 DOA: 09/07/2023     3 DOS: the patient was seen and examined on 09/11/2023   Brief hospital course: Danielle [name] is a 89 y.o. female with medical history significant for HTN, CAD, hypothyroidism, depression, COPD, OSA on CPAP, osteoarthritis, nonambulant at baseline and total care dependent with unknown left ischial sacral decubitus, managed by home health nurse who was brought to the ED with a 1 week history of weakness and in the past 2 days, altered mental status described as talking out of her head   Patient treated with Rocephin  and vancomycin  for infected decubitus ulcer and given a fluid bolus Hospitalist consulted for admission.   Assessment and Plan:  Infected decubitus ulcer, buttocks abscess Patient is not septic at this time. Culture results reviewed. Multiple organisms. Rocephin  changed to Cefepime  and vancomycin  per pharmacy protocol. General Surgery on board we appreciate recommendations. She may need to go OR for I&D.   Chronic anemia Suspect chronic blood loss from wound Anemia panel Monitor H&H   AKI (acute kidney injury) (HCC) Dehydration Suspect ATN from dehydration related to poor oral intake Continue hydration Monitor renal function and avoid nephrotoxins   Acute metabolic encephalopathy-improved In the setting of acute infection and dehydration Recent forgetfulness but no history of dementia, per daughter Continue delirium precaution   Osteoarthritis of knees, bilateral Bedbound status secondary to osteoarthritis Advanced age and frailty/functional quadriplegia Patient is total care dependent Patient receives knee injections due to chronic pain Continue naproxen, acetaminophen , cyclobenzaprine  Continue PT OT, she may need SNF placement.   OSA on CPAP Continue CPAP nightly   Depression Continue sertraline    Hypothyroidism, unspecified Continue levothyroxine    Chronic  obstructive pulmonary disease (HCC) Not acutely exacerbated Continue albuterol  as needed Patient follows with pulmonology, last seen October 2024   Hypertension Continue to hold HCTZ due to dehydration   Coronary arteriosclerosis in native artery No complaints of chest pain Continue Imdur  and aspirin      Out of bed to chair. Incentive spirometry. Nursing supportive care. Fall, aspiration precautions. DVT prophylaxis Lovenox    Code Status: Limited: Do not attempt resuscitation (DNR) -DNR-LIMITED -Do Not Intubate/DNI  Palliative on board for goals of care discussion.  Subjective: Patient is seen and examined today morning. She talks slowly, seems pleasant. Son at bedside, per him she gets confused which is not usual for her, eating poor.  Physical Exam: Vitals:   09/10/23 1551 09/11/23 0715 09/11/23 0759 09/11/23 1540  BP: (!) 122/38  (!) 126/56 (!) 141/49  Pulse: 72  67 62  Resp: 20  18 18   Temp: 97.7 F (36.5 C)  98 F (36.7 C) 98 F (36.7 C)  TempSrc: Oral     SpO2: 100%  100% 100%  Weight:  70.7 kg    Height:        General - Elderly thin built Caucasian female, no apparent distress HEENT - PERRLA, EOMI, atraumatic head, non tender sinuses. Lung - Clear, basal rales, rhonchi, no wheezes. Heart - S1, S2 heard, no murmurs, rubs, trace pedal edema. Abdomen - Soft, non tender, bowel sounds good Neuro - Alert, awake, follows simple commands, non focal exam. Skin - Warm and dry.  Data Reviewed:      Latest Ref Rng & Units 09/11/2023    5:18 AM 09/10/2023    5:59 AM 09/09/2023    4:35 AM  CBC  WBC 4.0 - 10.5 K/uL 6.9  8.1  5.9  Hemoglobin 12.0 - 15.0 g/dL 7.1  9.2  8.1   Hematocrit 36.0 - 46.0 % 21.3  27.3  25.0   Platelets 150 - 400 K/uL 158  202  186       Latest Ref Rng & Units 09/11/2023    5:18 AM 09/10/2023    5:59 AM 09/09/2023    4:35 AM  BMP  Glucose 70 - 99 mg/dL 91  85  897   BUN 8 - 23 mg/dL 21  20  22    Creatinine 0.44 - 1.00 mg/dL 9.14  9.14  9.06    Sodium 135 - 145 mmol/L 136  140  134   Potassium 3.5 - 5.1 mmol/L 3.3  3.8  4.7   Chloride 98 - 111 mmol/L 105  107  105   CO2 22 - 32 mmol/L 21  24  19    Calcium 8.9 - 10.3 mg/dL 8.4  9.4  8.8    No results found.   Family Communication: Discussed with son at bedside, he understand and agree. All questions answereed.    Disposition: Status is: Inpatient Remains inpatient appropriate because: IV antibioitcs, surgery eval  Planned Discharge Destination: Home with Home Health and Skilled nursing facility     Time spent: 40 minutes  Author: Concepcion Riser, MD 09/11/2023 8:36 PM Secure chat 7am to 7pm For on call review [email or web address]

## 2023-09-12 DIAGNOSIS — N179 Acute kidney failure, unspecified: Secondary | ICD-10-CM | POA: Diagnosis not present

## 2023-09-12 DIAGNOSIS — Z7189 Other specified counseling: Secondary | ICD-10-CM | POA: Diagnosis not present

## 2023-09-12 DIAGNOSIS — G9341 Metabolic encephalopathy: Secondary | ICD-10-CM | POA: Diagnosis not present

## 2023-09-12 DIAGNOSIS — Z7401 Bed confinement status: Secondary | ICD-10-CM | POA: Diagnosis not present

## 2023-09-12 DIAGNOSIS — R532 Functional quadriplegia: Secondary | ICD-10-CM | POA: Diagnosis not present

## 2023-09-12 LAB — BASIC METABOLIC PANEL
Anion gap: 6 (ref 5–15)
BUN: 24 mg/dL — ABNORMAL HIGH (ref 8–23)
CO2: 21 mmol/L — ABNORMAL LOW (ref 22–32)
Calcium: 8.5 mg/dL — ABNORMAL LOW (ref 8.9–10.3)
Chloride: 108 mmol/L (ref 98–111)
Creatinine, Ser: 0.9 mg/dL (ref 0.44–1.00)
GFR, Estimated: 60 mL/min — ABNORMAL LOW (ref >60)
Glucose, Bld: 90 mg/dL (ref 70–99)
Potassium: 3.8 mmol/L (ref 3.5–5.1)
Sodium: 135 mmol/L (ref 135–145)

## 2023-09-12 LAB — AEROBIC CULTURE W GRAM STAIN (SUPERFICIAL SPECIMEN)

## 2023-09-12 LAB — CULTURE, BLOOD (ROUTINE X 2)
Culture: NO GROWTH
Culture: NO GROWTH

## 2023-09-12 LAB — CBC
HCT: 22.1 % — ABNORMAL LOW (ref 36.0–46.0)
Hemoglobin: 7.5 g/dL — ABNORMAL LOW (ref 12.0–15.0)
MCH: 30.7 pg (ref 26.0–34.0)
MCHC: 33.9 g/dL (ref 30.0–36.0)
MCV: 90.6 fL (ref 80.0–100.0)
Platelets: 171 10*3/uL (ref 150–400)
RBC: 2.44 MIL/uL — ABNORMAL LOW (ref 3.87–5.11)
RDW: 16.4 % — ABNORMAL HIGH (ref 11.5–15.5)
WBC: 5.9 10*3/uL (ref 4.0–10.5)
nRBC: 0 % (ref 0.0–0.2)

## 2023-09-12 LAB — PHOSPHORUS: Phosphorus: 2.7 mg/dL (ref 2.5–4.6)

## 2023-09-12 LAB — MAGNESIUM: Magnesium: 1.8 mg/dL (ref 1.7–2.4)

## 2023-09-12 MED ORDER — PIPERACILLIN-TAZOBACTAM 3.375 G IVPB
3.3750 g | Freq: Three times a day (TID) | INTRAVENOUS | Status: DC
  Administered 2023-09-12 – 2023-09-18 (×17): 3.375 g via INTRAVENOUS
  Filled 2023-09-12 (×17): qty 50

## 2023-09-12 MED ORDER — EPINEPHRINE 0.3 MG/0.3ML IJ SOAJ
0.3000 mg | Freq: Once | INTRAMUSCULAR | Status: DC | PRN
  Filled 2023-09-12: qty 0.3

## 2023-09-12 MED ORDER — AMOXICILLIN 500 MG PO CAPS
500.0000 mg | ORAL_CAPSULE | Freq: Once | ORAL | Status: AC
  Administered 2023-09-12: 500 mg via ORAL
  Filled 2023-09-12: qty 1

## 2023-09-12 MED ORDER — DIPHENHYDRAMINE HCL 50 MG/ML IJ SOLN
25.0000 mg | Freq: Once | INTRAMUSCULAR | Status: DC | PRN
  Filled 2023-09-12: qty 1

## 2023-09-12 NOTE — Progress Notes (Signed)
 Progress Note   Patient: Danielle [name] FMW:[id number] DOB: September 11, 1928 DOA: 09/07/2023     4 DOS: the patient was seen and examined on 09/12/2023   Brief hospital course: Danielle [name] is a 89 y.o. female with medical history significant for HTN, CAD, hypothyroidism, depression, COPD, OSA on CPAP, osteoarthritis, nonambulant at baseline and total care dependent with unknown left ischial sacral decubitus, managed by home health nurse who was brought to the ED with a 1 week history of weakness and in the past 2 days, altered mental status described as talking out of her head   Patient treated with Rocephin  and vancomycin  for infected decubitus ulcer and given a fluid bolus. She is admitted to hospitalist service for infected decubitus ulcer for IV antibiotics. She refused surgical debridement, palliative consulted for goals of care discussion.   Assessment and Plan:  Infected decubitus ulcer, buttocks abscess No sepsis. Culture results reviewed. Multiple organisms E. coli, Morganella, Enterococcus.  Sensitivities reviewed. Discussed with pharmacist, she does not remember allergy to penicillin. Agreed to get amox challenge for antibiotics to be changed to Zosyn . General Surgery on board we appreciate recommendations. Family did not wish to have I&D. Palliative care discussed goals of care with patient and family.   Chronic anemia Suspect chronic blood loss from wound Iron 24, ferritin high, folate normal, B12 high H&H stable around 7.5. Continue iron supplementation.   AKI (acute kidney injury) (HCC) Dehydration Suspect ATN from dehydration related to poor oral intake Renal function improved. Monitor renal function and avoid nephrotoxins   Acute metabolic encephalopathy-improved In the setting of acute infection and dehydration Recent forgetfulness but no history of dementia, per daughter. Mental status much improved today.  Able to answer me appropriately. Continue delirium  precaution. Continue Aricept .   Osteoarthritis of knees, bilateral Bedbound status secondary to osteoarthritis Advanced age and frailty/functional quadriplegia Patient is total care dependent Patient receives knee injections due to chronic pain Continue naproxen, acetaminophen , cyclobenzaprine . Continue pain control. Continue PT OT, she will need long-term care.   OSA on CPAP Continue CPAP nightly   Depression Continue sertraline    Hypothyroidism, unspecified Continue levothyroxine    Chronic obstructive pulmonary disease (HCC) Not acutely exacerbated Continue albuterol  as needed Patient follows with pulmonology, last seen October 2024   Hypertension Hold HCTZ due to dehydration      Out of bed to chair. Incentive spirometry. Nursing supportive care. Fall, aspiration precautions. DVT prophylaxis Lovenox    Code Status: Limited: Do not attempt resuscitation (DNR) -DNR-LIMITED -Do Not Intubate/DNI  Palliative on board for goals of care discussion.  Subjective: Patient is seen and examined today morning. She is able to answer me appropriately.  Eating better today.  Denies any complaints.  Son at bedside.  Physical Exam: Vitals:   09/12/23 0810 09/12/23 1438 09/12/23 1449 09/12/23 1506  BP: (!) 136/49 (!) 127/56 (!) 131/41 (!) 108/45  Pulse: (!) 59 61 64 63  Resp: 18 18 16    Temp: 98 F (36.7 C) 98 F (36.7 C) 98.2 F (36.8 C) 98.2 F (36.8 C)  TempSrc:    Oral  SpO2: 96% 100% 100% 98%  Weight:      Height:        General - Elderly thin built Caucasian female, no apparent distress HEENT - PERRLA, EOMI, atraumatic head, non tender sinuses. Lung - Clear, basal rales, rhonchi, no wheezes. Heart - S1, S2 heard, no murmurs, rubs, trace pedal edema. Abdomen - Soft, non tender, bowel sounds good Neuro - Alert, awake,  oriented, non focal exam. Skin - Warm and dry.  Data Reviewed:      Latest Ref Rng & Units 09/12/2023    4:54 AM 09/11/2023    5:18 AM 09/10/2023     5:59 AM  CBC  WBC 4.0 - 10.5 K/uL 5.9  6.9  8.1   Hemoglobin 12.0 - 15.0 g/dL 7.5  7.1  9.2   Hematocrit 36.0 - 46.0 % 22.1  21.3  27.3   Platelets 150 - 400 K/uL 171  158  202       Latest Ref Rng & Units 09/12/2023    4:54 AM 09/11/2023    5:18 AM 09/10/2023    5:59 AM  BMP  Glucose 70 - 99 mg/dL 90  91  85   BUN 8 - 23 mg/dL 24  21  20    Creatinine 0.44 - 1.00 mg/dL 9.09  9.14  9.14   Sodium 135 - 145 mmol/L 135  136  140   Potassium 3.5 - 5.1 mmol/L 3.8  3.3  3.8   Chloride 98 - 111 mmol/L 108  105  107   CO2 22 - 32 mmol/L 21  21  24    Calcium 8.9 - 10.3 mg/dL 8.5  8.4  9.4    No results found.  Family Communication: Discussed with son at bedside, he understand and agree. All questions answereed.  Disposition: Status is: Inpatient Remains inpatient appropriate because: IV antibioitcs, surgery eval  Planned Discharge Destination: Home with Home Health and Skilled nursing facility     Time spent: 37 minutes  Author: Concepcion Riser, MD 09/12/2023 3:41 PM Secure chat 7am to 7pm For on call review [email or web address].

## 2023-09-12 NOTE — Plan of Care (Signed)
  Problem: Education: Goal: Knowledge of General Education information will improve Description: Including pain rating scale, medication(s)/side effects and non-pharmacologic comfort measures Outcome: Progressing   Problem: Health Behavior/Discharge Planning: Goal: Ability to manage health-related needs will improve Outcome: Progressing   Problem: Clinical Measurements: Goal: Ability to maintain clinical measurements within normal limits will improve Outcome: Progressing Goal: Will remain free from infection Outcome: Progressing Goal: Diagnostic test results will improve Outcome: Progressing Goal: Respiratory complications will improve Outcome: Progressing Goal: Cardiovascular complication will be avoided Outcome: Progressing   Problem: Activity: Goal: Risk for activity intolerance will decrease Outcome: Progressing   Problem: Nutrition: Goal: Adequate nutrition will be maintained Outcome: Progressing   Problem: Coping: Goal: Level of anxiety will decrease Outcome: Progressing   Problem: Elimination: Goal: Will not experience complications related to bowel motility Outcome: Progressing Goal: Will not experience complications related to urinary retention Outcome: Progressing   Problem: Pain Management: Goal: General experience of comfort will improve Outcome: Progressing   Problem: Safety: Goal: Ability to remain free from injury will improve Outcome: Progressing   Problem: Skin Integrity: Goal: Risk for impaired skin integrity will decrease Outcome: Progressing   Problem: Clinical Measurements: Goal: Ability to avoid or minimize complications of infection will improve Outcome: Progressing   Problem: Skin Integrity: Goal: Skin integrity will improve Outcome: Progressing

## 2023-09-12 NOTE — Progress Notes (Signed)
 Pharmacy - Antimicrobial Stewardship - Penicillin allergy clarification  39 yof with buttock abscess growing multiple organisms, including E faecalis but penicillin allergy documented (rash documented in [facility] but other facilities have reaction as unknown).  Spoke to patient and family member today and patient stated that reaction occurred as a child and she did not know what reaction was.  She stated she was not take to hospital for treatment.  She did not recall taking amoxicillin  or augmentin  in past.   PENFAST score 0-2 (so low risk of positive PCN allergy test)  Discussed with Dr Darci that can do amoxicillin  oral challenge to narrow antibiotics (stop vancomycin ) and will give better oral options at discharge.  Amoxicillin  500mg  po x 1 ordered and given today at 1436.  I went to room 1hr later and patient doing ok.  Discussed with patient's RN and no issues noted from RN perspective.  Plan Change antibiotics to piperacillin /tazobactam 3.375gm IV q8h over 4h infusion Oral options at discharge are amoxicillin  500mg  TID (or augmentin  875mg  BID) plus cipro  500mg  BID  Celestine Slovak, PharmD, BCPS, BCIDP Work Cell: [phone] 09/12/2023 3:54 PM

## 2023-09-12 NOTE — Progress Notes (Signed)
 Daily Progress Note   Patient Name: Danielle [name]       Date: 09/12/2023 DOB: 11-29-1928  Age: 89 y.o. MRN#: [id number] Attending Physician: Darci Pore, MD Primary Care Physician: Tobie Domino, MD Admit Date: 09/07/2023  Reason for Consultation/Follow-up: Establishing goals of care  Subjective: Notes and labs reviewed.  In to see patient.  She is currently sitting in bed eating breakfast.  She tells me that she is widowed.  She says she has a daughter. Patient states she has a son as well, Helayne, who is currently sitting at bedside.  Patient states she lives alone with private aides that come in to assist her.  She is bedbound and chair bound at baseline.  They state they have spoken with surgery service will not be going to the OR.  They will not be going to the OR.   We discussed her diagnosis, prognosis, GOC, EOL wishes disposition and options.  Created space and opportunity for patient  to explore thoughts and feelings regarding current medical information.   A detailed discussion was had today regarding advanced directives.  Concepts specific to code status, artifical feeding and hydration, IV antibiotics and rehospitalization were discussed.  The difference between an aggressive medical intervention path and a comfort care path was discussed.  Values and goals of care important to patient and family were attempted to be elicited.  Discussed limitations of medical interventions to prolong quality of life in some situations and discussed the concept of human mortality.  She states she is a woman of faith and prays as decisions need to be made.  She states she she will need to pray about care moving forward.  She is aware she can reach out to a hospice company if she would like  to look into hospice care at home.  They state they have already been advised of a recommendation about considering [facility].   She confirms DNR/DNI.     Length of Stay: 4  Current Medications: Scheduled Meds:  . vitamin C   500 mg Oral BID  . diclofenac  Sodium  2 g Topical QID  . donepezil   5 mg Oral QHS  . enoxaparin  (LOVENOX ) injection  40 mg Subcutaneous Q24H  . feeding supplement  237 mL Oral TID BM  . levothyroxine   150 mcg Oral Q0600  .  multivitamin with minerals  1 tablet Oral Daily  . sertraline   50 mg Oral Daily    Continuous Infusions: . ceFEPime  (MAXIPIME ) IV 2 g (09/12/23 0834)  . vancomycin  750 mg (09/11/23 0843)    PRN Meds: acetaminophen  **OR** acetaminophen , cyclobenzaprine , HYDROcodone -acetaminophen , iohexol , ondansetron  **OR** ondansetron  (ZOFRAN ) IV  Physical Exam Pulmonary:     Effort: Pulmonary effort is normal.  Neurological:     Mental Status: She is alert.             Vital Signs: BP (!) 136/49 (BP Location: Left Arm)   Pulse (!) 59   Temp 98 F (36.7 C)   Resp 18   Ht 5' 3 (1.6 m)   Wt 51.1 kg   SpO2 96%   BMI 19.96 kg/m  SpO2: SpO2: 96 % O2 Device: O2 Device: Room Air O2 Flow Rate:    Intake/output summary:  Intake/Output Summary (Last 24 hours) at 09/12/2023 9072 Last data filed at 09/12/2023 0600 Gross per 24 hour  Intake 350 ml  Output 500 ml  Net -150 ml   LBM:   Baseline Weight: Weight: 66.7 kg Most recent weight: Weight: 51.1 kg   Patient Active Problem List   Diagnosis Date Noted  . Protein-calorie malnutrition, severe 09/09/2023  . Infected decubitus ulcer 09/08/2023  . Acute metabolic encephalopathy 09/08/2023  . AKI (acute kidney injury) (HCC) 09/08/2023  . Bedbound 09/08/2023  . Advanced age 29/02/2024  . OSA on CPAP 09/08/2023  . Functional quadriplegia (HCC) 09/08/2023  . Chronic anemia 09/08/2023  . Sacral wound, initial encounter 09/08/2023  . Depression   . Osteoarthritis of knees, bilateral  12/30/2022  . Constipation 02/26/2021  . Hypothyroidism, unspecified 12/13/2020  . Chronic obstructive pulmonary disease (HCC) 09/02/2020  . Pain due to onychomycosis of toenails of both feet 02/15/2019  . Urge incontinence of urine 07/03/2017  . Coronary arteriosclerosis in native artery 05/29/2014  . CAD (coronary artery disease), native coronary artery 05/29/2014  . Hyperlipidemia 05/29/2014  . Hypertension 05/29/2014  . Hyperlipidemia, unspecified 05/29/2014  . Pain in extremity 06/21/2013  . Onychomycosis due to dermatophyte 06/21/2013    Palliative Care Assessment & Plan    Recommendations/Plan: DNR/DNI Patient is aware she can request hospice to evaluate her if she is interested in home with hospice. They state they have already been advised of a recommendation about considering [facility].  PMT will shadow for needs.  Code Status:    Code Status Orders  (From admission, onward)           Start     Ordered   09/08/23 0229  Do not attempt resuscitation (DNR)- Limited -Do Not Intubate (DNI)  Continuous       Question Answer Comment  If pulseless and not breathing No CPR or chest compressions.   In Pre-Arrest Conditions (Patient Is Breathing and Has A Pulse) Do not intubate. Provide all appropriate non-invasive medical interventions. Avoid ICU transfer unless indicated or required.   Consent: Discussion documented in EHR or advanced directives reviewed      09/08/23 0231           Code Status History     Date Active Date Inactive Code Status Order ID Comments User Context   02/26/2021 2025 02/27/2021 1944 DNR [id number]  Lawence Madison LABOR, MD ED   02/26/2021 1928 02/26/2021 2025 Full Code [id number]  Mansy, Madison LABOR, MD ED   08/09/2012 2232 08/11/2012 1538 Full Code [id number]  Colon Shove, MD Inpatient  Care plan was discussed with surgery service  Thank you for allowing the Palliative Medicine Team to assist in the care of this patient.    Camelia Lewis, NP  Please contact Palliative Medicine Team phone at [phone] for questions and concerns.

## 2023-09-12 NOTE — TOC Initial Note (Signed)
 Transition of Care [facility]) - Initial/Assessment Note    Patient Details  Name: Danielle [name] MRN: [id number] Date of Birth: 09/03/1928  Transition of Care [facility]) CM/SW Contact:    Corean ONEIDA Haddock, RN Phone Number: 09/12/2023, 3:46 PM  Clinical Narrative:                  Per chart patient bed bound Per PT note patient lives alone Personal care attendant (son reports he lives behind her and checks on her every house. Has aides who come 4 hour blocks each day, however per RN, aides have not been coming recently.   Therapy recommending long term care placement  VM left for daughter [name] to discuss disposition  Nursing states that son was at bedside.  When toc arrived to room patient was alone and A&O x2.   TOC to follow up with family on recs       Patient Goals and CMS Choice            Expected Discharge Plan and Services                                              Prior Living Arrangements/Services                       Activities of Daily Living   ADL Screening (condition at time of admission) Independently performs ADLs?: No Does the patient have a NEW difficulty with bathing/dressing/toileting/self-feeding that is expected to last >3 days?: No Does the patient have a NEW difficulty with getting in/out of bed, walking, or climbing stairs that is expected to last >3 days?: No Does the patient have a NEW difficulty with communication that is expected to last >3 days?: No Is the patient deaf or have difficulty hearing?: No Does the patient have difficulty seeing, even when wearing glasses/contacts?: No Does the patient have difficulty concentrating, remembering, or making decisions?: Yes (new confusion is acute change)  Permission Sought/Granted                  Emotional Assessment              Admission diagnosis:  Metabolic encephalopathy [G93.41] Infected decubitus ulcer [L89.90, L08.9] AKI (acute kidney injury)  (HCC) [N17.9] Sacral wound, initial encounter [S31.000A] Patient Active Problem List   Diagnosis Date Noted   Protein-calorie malnutrition, severe 09/09/2023   Infected decubitus ulcer 09/08/2023   Acute metabolic encephalopathy 09/08/2023   AKI (acute kidney injury) (HCC) 09/08/2023   Bedbound 09/08/2023   Advanced age 89/02/2024   OSA on CPAP 09/08/2023   Functional quadriplegia (HCC) 09/08/2023   Chronic anemia 09/08/2023   Sacral wound, initial encounter 09/08/2023   Depression    Osteoarthritis of knees, bilateral 12/30/2022   Constipation 02/26/2021   Hypothyroidism, unspecified 12/13/2020   Chronic obstructive pulmonary disease (HCC) 09/02/2020   Pain due to onychomycosis of toenails of both feet 02/15/2019   Urge incontinence of urine 07/03/2017   Coronary arteriosclerosis in native artery 05/29/2014   CAD (coronary artery disease), native coronary artery 05/29/2014   Hyperlipidemia 05/29/2014   Hypertension 05/29/2014   Hyperlipidemia, unspecified 05/29/2014   Pain in extremity 06/21/2013   Onychomycosis due to dermatophyte 06/21/2013   PCP:  Tobie Domino, MD Pharmacy:   [id number] - [place], [place] - [address]  AT [facility] [address] [place] [address] Phone: [phone] Fax: [phone]     Social Drivers of Health (SDOH) Social History: SDOH Screenings   Food Insecurity: No Food Insecurity (09/09/2023)  Housing: Low Risk  (09/09/2023)  Transportation Needs: No Transportation Needs (09/09/2023)  Utilities: Not At Risk (09/09/2023)  Alcohol Screen: Low Risk  (10/06/2018)  Depression (PHQ2-9): Low Risk  (10/06/2018)  Social Connections: Socially Isolated (09/09/2023)  Tobacco Use: Low Risk  (09/09/2023)   SDOH Interventions:     Readmission Risk Interventions     No data to display

## 2023-09-12 NOTE — Progress Notes (Signed)
 Surgery  I had a discussion with the patient and her daughter and POA yesterday afternoon. I discussed with them that her wound was infected and continued to drain purulent fluid. She is hemodynamically normal and her white count has always been normal. My one concern is that given the ongoing drainage is there more necrotic tissue underneath the wound and its counter incision. We talked about the options for her including doing a more extensive debridement of the underlying tissue. This, however, would require a trip to the operating room. The other option is to continue with local wound care in hopes that she remains hemodynamically normal and that any underlying necrotic tissue will liquefy and drain. The patient is adamant about not wanting their mother to undergo operative debridement. While the patient is somewhat confused last night she was oriented and also expressed the same desire. Given her good clinical state I think this is reasonable. Recommend continuing packing wounds daily and as needed to allow for drainage of any infection. Once the purulence stops can allow the wounds to heal on their own or place wound vac in the tunneled incision. The surgery team will sign off for now but please call us  with any questions or concerns.    Jayson Endow, M.D. [facility]

## 2023-09-13 DIAGNOSIS — R54 Age-related physical debility: Secondary | ICD-10-CM

## 2023-09-13 DIAGNOSIS — D649 Anemia, unspecified: Secondary | ICD-10-CM

## 2023-09-13 DIAGNOSIS — E43 Unspecified severe protein-calorie malnutrition: Secondary | ICD-10-CM | POA: Diagnosis not present

## 2023-09-13 DIAGNOSIS — Z7401 Bed confinement status: Secondary | ICD-10-CM | POA: Diagnosis not present

## 2023-09-13 DIAGNOSIS — N179 Acute kidney failure, unspecified: Secondary | ICD-10-CM | POA: Diagnosis not present

## 2023-09-13 DIAGNOSIS — G9341 Metabolic encephalopathy: Secondary | ICD-10-CM | POA: Diagnosis not present

## 2023-09-13 LAB — HEMOGLOBIN AND HEMATOCRIT, BLOOD
HCT: 23.7 % — ABNORMAL LOW (ref 36.0–46.0)
Hemoglobin: 7.9 g/dL — ABNORMAL LOW (ref 12.0–15.0)

## 2023-09-13 LAB — ABO/RH: ABO/RH(D): O POS *Deleted

## 2023-09-13 MED ORDER — SENNA 8.6 MG PO TABS
1.0000 | ORAL_TABLET | Freq: Every day | ORAL | Status: DC
  Administered 2023-09-13 – 2023-09-30 (×16): 8.6 mg via ORAL
  Filled 2023-09-13 (×18): qty 1

## 2023-09-13 MED ORDER — MIRABEGRON ER 25 MG PO TB24
25.0000 mg | ORAL_TABLET | Freq: Every day | ORAL | Status: DC
  Administered 2023-09-13 – 2023-09-30 (×18): 25 mg via ORAL
  Filled 2023-09-13 (×19): qty 1

## 2023-09-13 MED ORDER — ISOSORBIDE MONONITRATE ER 30 MG PO TB24
60.0000 mg | ORAL_TABLET | Freq: Every day | ORAL | Status: DC
  Administered 2023-09-13: 60 mg via ORAL
  Filled 2023-09-13: qty 2

## 2023-09-13 MED ORDER — AMLODIPINE BESYLATE 5 MG PO TABS
5.0000 mg | ORAL_TABLET | Freq: Every day | ORAL | Status: DC
  Administered 2023-09-14 – 2023-09-30 (×17): 5 mg via ORAL
  Filled 2023-09-13 (×17): qty 1

## 2023-09-13 MED ORDER — FERROUS SULFATE 325 (65 FE) MG PO TABS
325.0000 mg | ORAL_TABLET | Freq: Every day | ORAL | Status: DC
  Administered 2023-09-14 – 2023-09-18 (×5): 325 mg via ORAL
  Filled 2023-09-13 (×5): qty 1

## 2023-09-13 MED ORDER — ALUM & MAG HYDROXIDE-SIMETH 200-200-20 MG/5ML PO SUSP: 30.0000 mL | ORAL | Status: DC | PRN

## 2023-09-13 NOTE — Progress Notes (Signed)
 Progress Note   Patient: Danielle [name] FMW:[id number] DOB: [birth year] DOA: 09/07/2023     5 DOS: the patient was seen and examined on 09/13/2023   Brief hospital course: Danielle [name] is a 89 y.o. female with medical history significant for HTN, CAD, hypothyroidism, depression, COPD, OSA on CPAP, osteoarthritis, nonambulant at baseline and total care dependent with unknown left ischial sacral decubitus, managed by home health nurse who was brought to the ED with a 1 week history of weakness and in the past 2 days, altered mental status described as talking out of her head   Patient treated with Rocephin  and vancomycin  for infected decubitus ulcer and given a fluid bolus. She is admitted to hospitalist service for infected decubitus ulcer for IV antibiotics. She refused surgical debridement, palliative consulted for goals of care discussion with her and family.  Assessment and Plan: Infected decubitus ulcer, buttock abscess No sepsis. Culture results reviewed. Multiple organisms E. coli, Morganella, Enterococcus.  Sensitivities reviewed. Discussed with pharmacist, she tolerated Amox challenge. Antibiotics changed to Zosyn  therapy. General Surgery on board, appreciate recommendations. Family did not wish to have I&D.  Palliative care discussed goals of care with patient and family.   Chronic anemia Suspect chronic blood loss from wound Iron 24, ferritin high, folate normal, B12 high H&H stable around 7.5. Continue iron supplementation. Transfuse if Hb less than 7.   AKI (acute kidney injury) (HCC) Dehydration Suspect ATN from dehydration related to poor oral intake Renal function improved. Monitor renal function and avoid nephrotoxins.  Acute metabolic encephalopathy-improved In the setting of acute infection and dehydration Recent forgetfulness but no history of dementia, per daughter. Mental status much improved.  Able to answer me appropriately. Continue delirium  precaution. Continue Aricept .   Hypertension: BP stable. Resumed home imdur  therapy.   Osteoarthritis of knees, bilateral Bedbound status secondary to osteoarthritis Advanced age and frailty/functional quadriplegia Patient is total care dependent Patient receives knee injections due to chronic pain Continue acetaminophen , cyclobenzaprine . Continue pain control. PT OT advised she will need long-term care.   OSA on CPAP Continue CPAP nightly   Depression Continue sertraline    Hypothyroidism, unspecified Continue home dose levothyroxine  150mcg.   Chronic obstructive pulmonary disease (HCC) Not acutely exacerbated Continue albuterol  as needed Follow up with pulmonology as scheduled.       Out of bed to chair. Incentive spirometry. Nursing supportive care. Fall, aspiration precautions. DVT prophylaxis Lovenox    Code Status: Limited: Do not attempt resuscitation (DNR) -DNR-LIMITED -Do Not Intubate/DNI  Palliative on board for goals of care discussion.  Subjective: Patient is seen and examined today morning. She is able to answer me appropriately. Denies any complaints. Eating fair. No family at bedside.  Physical Exam: Vitals:   09/12/23 2120 09/13/23 0329 09/13/23 0351 09/13/23 0756  BP: (!) 148/52 (!) 159/53  (!) 135/46  Pulse: 62 61  (!) 53  Resp: 18 18  19   Temp: 98.7 F (37.1 C) 98.1 F (36.7 C)  98 F (36.7 C)  TempSrc: Oral Oral    SpO2: 100% 100%  100%  Weight:   51.2 kg   Height:        General - Elderly thin built Caucasian female, no apparent distress HEENT - PERRLA, EOMI, atraumatic head, non tender sinuses. Lung - Clear, basal rales, rhonchi, no wheezes. Heart - S1, S2 heard, no murmurs, rubs, trace pedal edema. Abdomen - Soft, non tender, bowel sounds good Neuro - Alert, awake, oriented, non focal exam. Skin - Warm and  dry.  Data Reviewed:      Latest Ref Rng & Units 09/12/2023    4:54 AM 09/11/2023    5:18 AM 09/10/2023    5:59 AM  CBC   WBC 4.0 - 10.5 K/uL 5.9  6.9  8.1   Hemoglobin 12.0 - 15.0 g/dL 7.5  7.1  9.2   Hematocrit 36.0 - 46.0 % 22.1  21.3  27.3   Platelets 150 - 400 K/uL 171  158  202       Latest Ref Rng & Units 09/12/2023    4:54 AM 09/11/2023    5:18 AM 09/10/2023    5:59 AM  BMP  Glucose 70 - 99 mg/dL 90  91  85   BUN 8 - 23 mg/dL 24  21  20    Creatinine 0.44 - 1.00 mg/dL 9.09  9.14  9.14   Sodium 135 - 145 mmol/L 135  136  140   Potassium 3.5 - 5.1 mmol/L 3.8  3.3  3.8   Chloride 98 - 111 mmol/L 108  105  107   CO2 22 - 32 mmol/L 21  21  24    Calcium 8.9 - 10.3 mg/dL 8.5  8.4  9.4    No results found.  Family Communication: Discussed with daughter, grandson over phone bedside, they understand and agree. All questions answereed.  Disposition: Status is: Inpatient Remains inpatient appropriate because: IV antibioitcs, monitor h/h  Planned Discharge Destination: Home with Home Health and Skilled nursing facility     Time spent: 37 minutes  Author: Concepcion Riser, MD 09/13/2023 12:43 PM Secure chat 7am to 7pm For on call review [email or web address]

## 2023-09-13 NOTE — Plan of Care (Signed)
  Problem: Education: Goal: Knowledge of General Education information will improve Description: Including pain rating scale, medication(s)/side effects and non-pharmacologic comfort measures Outcome: Progressing   Problem: Health Behavior/Discharge Planning: Goal: Ability to manage health-related needs will improve Outcome: Progressing   Problem: Clinical Measurements: Goal: Ability to maintain clinical measurements within normal limits will improve Outcome: Progressing Goal: Will remain free from infection Outcome: Progressing Goal: Diagnostic test results will improve Outcome: Progressing Goal: Respiratory complications will improve Outcome: Progressing Goal: Cardiovascular complication will be avoided Outcome: Progressing   Problem: Activity: Goal: Risk for activity intolerance will decrease Outcome: Progressing   Problem: Nutrition: Goal: Adequate nutrition will be maintained Outcome: Progressing   Problem: Coping: Goal: Level of anxiety will decrease Outcome: Progressing   Problem: Elimination: Goal: Will not experience complications related to bowel motility Outcome: Progressing Goal: Will not experience complications related to urinary retention Outcome: Progressing   Problem: Pain Management: Goal: General experience of comfort will improve Outcome: Progressing   Problem: Safety: Goal: Ability to remain free from injury will improve Outcome: Progressing   Problem: Skin Integrity: Goal: Risk for impaired skin integrity will decrease Outcome: Progressing   Problem: Clinical Measurements: Goal: Ability to avoid or minimize complications of infection will improve Outcome: Progressing   Problem: Skin Integrity: Goal: Skin integrity will improve Outcome: Progressing

## 2023-09-14 DIAGNOSIS — E43 Unspecified severe protein-calorie malnutrition: Secondary | ICD-10-CM | POA: Diagnosis not present

## 2023-09-14 DIAGNOSIS — Z7401 Bed confinement status: Secondary | ICD-10-CM | POA: Diagnosis not present

## 2023-09-14 DIAGNOSIS — G9341 Metabolic encephalopathy: Secondary | ICD-10-CM | POA: Diagnosis not present

## 2023-09-14 DIAGNOSIS — N179 Acute kidney failure, unspecified: Secondary | ICD-10-CM | POA: Diagnosis not present

## 2023-09-14 LAB — HEMOGLOBIN AND HEMATOCRIT, BLOOD
HCT: 19.7 % — ABNORMAL LOW (ref 36.0–46.0)
Hemoglobin: 6.7 g/dL — ABNORMAL LOW (ref 12.0–15.0)

## 2023-09-14 LAB — ABO/RH: ABO/RH(D): O POS

## 2023-09-14 MED ORDER — SODIUM CHLORIDE 0.9% IV SOLUTION: Freq: Once | INTRAVENOUS | Status: AC

## 2023-09-14 NOTE — Progress Notes (Signed)
 Progress Note   Patient: Danielle [name] FMW:[id number] DOB: 23-Sep-1928 DOA: 09/07/2023     6 DOS: the patient was seen and examined on 09/14/2023   Brief hospital course: Danielle [name] is a 89 y.o. female with medical history significant for HTN, CAD, hypothyroidism, depression, COPD, OSA on CPAP, osteoarthritis, nonambulant at baseline and total care dependent with unknown left ischial sacral decubitus, managed by home health nurse who was brought to the ED with a 1 week history of weakness and in the past 2 days, altered mental status described as talking out of her head   Patient treated with Rocephin  and vancomycin  for infected decubitus ulcer and given a fluid bolus. She is admitted to hospitalist service for infected decubitus ulcer for IV antibiotics. She refused surgical debridement, palliative consulted for goals of care discussion with her and family. Antibiotic changed to Zosyn  after Amoxicillin  challenge.   Assessment and Plan: Infected decubitus ulcer, buttock abscess No sepsis. Culture results reviewed. Multiple organisms E. coli, Morganella, Enterococcus.  Sensitivities reviewed. Discussed with pharmacist, she tolerated Amox challenge. Antibiotics changed to Zosyn  therapy. General Surgery on board, appreciate recommendations. Family did not wish to have I&D. Palliative care discussed goals of care with patient and family. Family wishes her to go SNF with palliative care.   Acute on Chronic anemia Suspect chronic blood loss from wound Iron 24, ferritin high, folate normal, B12 high H&H 6.7 today. 1 unit of blood transfusion ordered. Continue iron supplementation. Monitor H/H  daily.   AKI (acute kidney injury) (HCC) Dehydration Suspect ATN from dehydration related to poor oral intake Renal function improved. Monitor renal function and avoid nephrotoxins.  Acute metabolic encephalopathy-improved In the setting of acute infection and dehydration Recent forgetfulness  but no history of dementia, per daughter. Mental status much improved.  Able to answer me appropriately. Continue delirium precaution. Continue Aricept .   Hypertension: BP stable. Started on Norvasc  as imdur  can't be crushed.   Osteoarthritis of knees, bilateral Bedbound status secondary to osteoarthritis Advanced age and frailty/functional quadriplegia Patient is total care dependent Patient receives knee injections due to chronic pain Continue acetaminophen , cyclobenzaprine . Continue pain control. PT OT advised she will need long-term care.   OSA on CPAP Continue CPAP nightly   Depression Continue sertraline    Hypothyroidism, unspecified Continue home dose levothyroxine  150mcg.   Chronic obstructive pulmonary disease (HCC) Not acutely exacerbated Continue albuterol  as needed Follow up with pulmonology as scheduled.       Out of bed to chair. Incentive spirometry. Nursing supportive care. Fall, aspiration precautions. DVT prophylaxis Lovenox    Code Status: Limited: Do not attempt resuscitation (DNR) -DNR-LIMITED -Do Not Intubate/DNI  Palliative on board for goals of care discussion.  Subjective: Patient is seen and examined today morning. She is able to answer me, eating better. Family at bedside. Discussed about need for blood, she understands.   Physical Exam: Vitals:   09/13/23 1950 09/14/23 0441 09/14/23 0442 09/14/23 0905  BP: (!) 119/46  (!) 115/46 (!) 118/46  Pulse: 61  61 67  Resp: 18  18 20   Temp: 98.8 F (37.1 C)  98.4 F (36.9 C) 98.3 F (36.8 C)  TempSrc: Oral  Oral Oral  SpO2: 100%  100% 100%  Weight:  72.8 kg    Height:        General - Elderly thin built Caucasian female, no apparent distress HEENT - PERRLA, EOMI, atraumatic head, non tender sinuses. Lung - Clear, basal rales, rhonchi, no wheezes. Heart - S1, S2  heard, no murmurs, rubs, trace pedal edema. Abdomen - Soft, non tender, bowel sounds good Neuro - Alert, awake, oriented, non  focal exam. Skin - Warm and dry.  Data Reviewed:      Latest Ref Rng & Units 09/14/2023   12:48 PM 09/13/2023    2:16 PM 09/12/2023    4:54 AM  CBC  WBC 4.0 - 10.5 K/uL   5.9   Hemoglobin 12.0 - 15.0 g/dL 6.7  7.9  7.5   Hematocrit 36.0 - 46.0 % 19.7  23.7  22.1   Platelets 150 - 400 K/uL   171       Latest Ref Rng & Units 09/12/2023    4:54 AM 09/11/2023    5:18 AM 09/10/2023    5:59 AM  BMP  Glucose 70 - 99 mg/dL 90  91  85   BUN 8 - 23 mg/dL 24  21  20    Creatinine 0.44 - 1.00 mg/dL 9.09  9.14  9.14   Sodium 135 - 145 mmol/L 135  136  140   Potassium 3.5 - 5.1 mmol/L 3.8  3.3  3.8   Chloride 98 - 111 mmol/L 108  105  107   CO2 22 - 32 mmol/L 21  21  24    Calcium 8.9 - 10.3 mg/dL 8.5  8.4  9.4    No results found.  Family Communication: Discussed with family at bedside, they understand and agree. All questions answereed.  Disposition: Status is: Inpatient Remains inpatient appropriate because: IV antibioitcs, monitor h/h, PRBC transfusion.  Planned Discharge Destination: Home with Home Health and Skilled nursing facility     Time spent: 38 minutes  Author: Concepcion Riser, MD 09/14/2023 1:28 PM Secure chat 7am to 7pm For on call review [email or web address]

## 2023-09-14 NOTE — Plan of Care (Signed)
  Problem: Clinical Measurements: Goal: Diagnostic test results will improve Outcome: Progressing   Problem: Clinical Measurements: Goal: Respiratory complications will improve Outcome: Progressing   Problem: Clinical Measurements: Goal: Cardiovascular complication will be avoided Outcome: Progressing   Problem: Pain Management: Goal: General experience of comfort will improve Outcome: Progressing   Problem: Skin Integrity: Goal: Risk for impaired skin integrity will decrease Outcome: Progressing

## 2023-09-14 NOTE — Plan of Care (Signed)
  Problem: Education: Goal: Knowledge of General Education information will improve Description: Including pain rating scale, medication(s)/side effects and non-pharmacologic comfort measures Outcome: Progressing   Problem: Health Behavior/Discharge Planning: Goal: Ability to manage health-related needs will improve Outcome: Progressing   Problem: Clinical Measurements: Goal: Ability to maintain clinical measurements within normal limits will improve Outcome: Progressing Goal: Will remain free from infection Outcome: Progressing Goal: Diagnostic test results will improve Outcome: Progressing Goal: Respiratory complications will improve Outcome: Progressing Goal: Cardiovascular complication will be avoided Outcome: Progressing   Problem: Activity: Goal: Risk for activity intolerance will decrease Outcome: Progressing   Problem: Nutrition: Goal: Adequate nutrition will be maintained Outcome: Progressing   Problem: Coping: Goal: Level of anxiety will decrease Outcome: Progressing   Problem: Elimination: Goal: Will not experience complications related to bowel motility Outcome: Progressing Goal: Will not experience complications related to urinary retention Outcome: Progressing   Problem: Pain Management: Goal: General experience of comfort will improve Outcome: Progressing   Problem: Safety: Goal: Ability to remain free from injury will improve Outcome: Progressing   Problem: Skin Integrity: Goal: Risk for impaired skin integrity will decrease Outcome: Progressing   Problem: Clinical Measurements: Goal: Ability to avoid or minimize complications of infection will improve Outcome: Progressing   Problem: Skin Integrity: Goal: Skin integrity will improve Outcome: Progressing

## 2023-09-15 DIAGNOSIS — N179 Acute kidney failure, unspecified: Secondary | ICD-10-CM | POA: Diagnosis not present

## 2023-09-15 DIAGNOSIS — L8993 Pressure ulcer of unspecified site, stage 3: Secondary | ICD-10-CM

## 2023-09-15 DIAGNOSIS — Z7401 Bed confinement status: Secondary | ICD-10-CM | POA: Diagnosis not present

## 2023-09-15 DIAGNOSIS — G9341 Metabolic encephalopathy: Secondary | ICD-10-CM | POA: Diagnosis not present

## 2023-09-15 DIAGNOSIS — E43 Unspecified severe protein-calorie malnutrition: Secondary | ICD-10-CM | POA: Diagnosis not present

## 2023-09-15 LAB — CBC
HCT: 28.3 % — ABNORMAL LOW (ref 36.0–46.0)
Hemoglobin: 9.4 g/dL — ABNORMAL LOW (ref 12.0–15.0)
MCH: 30.5 pg (ref 26.0–34.0)
MCHC: 33.2 g/dL (ref 30.0–36.0)
MCV: 91.9 fL (ref 80.0–100.0)
Platelets: 164 10*3/uL (ref 150–400)
RBC: 3.08 MIL/uL — ABNORMAL LOW (ref 3.87–5.11)
RDW: 17.2 % — ABNORMAL HIGH (ref 11.5–15.5)
WBC: 5.3 10*3/uL (ref 4.0–10.5)
nRBC: 0 % (ref 0.0–0.2)

## 2023-09-15 NOTE — [place] FL2 (Signed)
 [place]  MEDICAID FL2 LEVEL OF CARE FORM     IDENTIFICATION  Patient Name: Danielle [name] Birthdate: 12/04/1928 Sex: female Admission Date (Current Location): 09/07/2023  [facility] and [place] Number:  Chiropodist and Address:         Provider Number: [id number]  Attending Physician Name and Address:  Darci Pore, MD  Relative Name and Phone Number:       Current Level of Care: Hospital Recommended Level of Care: Nursing Facility Prior Approval Number:    Date Approved/Denied:   PASRR Number: Pending  Discharge Plan: SNF    Current Diagnoses: Patient Active Problem List   Diagnosis Date Noted   Protein-calorie malnutrition, severe 09/09/2023   Infected decubitus ulcer 09/08/2023   Acute metabolic encephalopathy 09/08/2023   AKI (acute kidney injury) (HCC) 09/08/2023   Bedbound 09/08/2023   Advanced age 74/02/2024   OSA on CPAP 09/08/2023   Functional quadriplegia (HCC) 09/08/2023   Chronic anemia 09/08/2023   Sacral wound, initial encounter 09/08/2023   Depression    Osteoarthritis of knees, bilateral 12/30/2022   Constipation 02/26/2021   Hypothyroidism, unspecified 12/13/2020   Chronic obstructive pulmonary disease (HCC) 09/02/2020   Pain due to onychomycosis of toenails of both feet 02/15/2019   Urge incontinence of urine 07/03/2017   Coronary arteriosclerosis in native artery 05/29/2014   CAD (coronary artery disease), native coronary artery 05/29/2014   Hyperlipidemia 05/29/2014   Hypertension 05/29/2014   Hyperlipidemia, unspecified 05/29/2014   Pain in extremity 06/21/2013   Onychomycosis due to dermatophyte 06/21/2013    Orientation RESPIRATION BLADDER Height & Weight     Self, Time, Situation, Place  Normal Incontinent Weight: 69.6 kg Height:  5' 3 (160 cm)  BEHAVIORAL SYMPTOMS/MOOD NEUROLOGICAL BOWEL NUTRITION STATUS      Incontinent Diet (dys 3)  AMBULATORY STATUS COMMUNICATION OF NEEDS Skin   Total Care Verbally PU  Stage and Appropriate Care, Other (Comment) (Skin tear)                       Personal Care Assistance Level of Assistance              Functional Limitations Info             SPECIAL CARE FACTORS FREQUENCY                       Contractures Contractures Info: Not present    Additional Factors Info  Code Status, Allergies Code Status Info: DNR Allergies Info: Ace inhibitors, penicillin           Current Medications (09/15/2023):  This is the current hospital active medication list Current Facility-Administered Medications  Medication Dose Route Frequency Provider Last Rate Last Admin   acetaminophen  (TYLENOL ) tablet 650 mg  650 mg Oral Q6H PRN Duncan, Hazel V, MD   650 mg at 09/14/23 0113   Or   acetaminophen  (TYLENOL ) suppository 650 mg  650 mg Rectal Q6H PRN Duncan, Hazel V, MD       alum & mag hydroxide-simeth (MAALOX/MYLANTA) 200-200-20 MG/5ML suspension 30 mL  30 mL Oral PRN Sreeram, Narendranath, MD       amLODipine  (NORVASC ) tablet 5 mg  5 mg Oral Daily Sreeram, Narendranath, MD   5 mg at 09/15/23 1212   ascorbic acid  (VITAMIN C ) tablet 500 mg  500 mg Oral BID Dorinda Homans T, MD   500 mg at 09/15/23 1212   cyclobenzaprine  (FLEXERIL ) tablet 5 mg  5 mg Oral TID PRN Duncan, Hazel V, MD   5 mg at 09/13/23 1243   diclofenac  Sodium (VOLTAREN ) 1 % topical gel 2 g  2 g Topical QID Sreeram, Narendranath, MD   2 g at 09/15/23 1432   diphenhydrAMINE  (BENADRYL ) injection 25 mg  25 mg Intravenous Once PRN Darci Pore, MD       donepezil  (ARICEPT ) tablet 5 mg  5 mg Oral QHS Duncan, Hazel V, MD   5 mg at 09/14/23 2105   enoxaparin  (LOVENOX ) injection 40 mg  40 mg Subcutaneous Q24H Dobbs, Andrea K, RPH   40 mg at 09/15/23 1213   EPINEPHrine  (EPI-PEN) injection 0.3 mg  0.3 mg Intramuscular Once PRN Darci Pore, MD       feeding supplement (ENSURE ENLIVE / ENSURE PLUS) liquid 237 mL  237 mL Oral TID BM Dorinda Homans T, MD   237 mL at 09/15/23 1432    ferrous sulfate  tablet 325 mg  325 mg Oral Q breakfast Darci Pore, MD   325 mg at 09/15/23 1213   HYDROcodone -acetaminophen  (NORCO/VICODIN) 5-325 MG per tablet 1-2 tablet  1-2 tablet Oral Q4H PRN Duncan, Hazel V, MD   1 tablet at 09/15/23 1212   iohexol  (OMNIPAQUE ) 300 MG/ML solution 80 mL  80 mL Intravenous Once PRN Dorinda Homans T, MD       levothyroxine  (SYNTHROID ) tablet 150 mcg  150 mcg Oral Q0600 Djan, Prince T, MD   150 mcg at 09/15/23 0535   mirabegron  ER (MYRBETRIQ ) tablet 25 mg  25 mg Oral Daily Sreeram, Narendranath, MD   25 mg at 09/14/23 0858   multivitamin with minerals tablet 1 tablet  1 tablet Oral Daily Dorinda Homans T, MD   1 tablet at 09/15/23 1212   ondansetron  (ZOFRAN ) tablet 4 mg  4 mg Oral Q6H PRN Duncan, Hazel V, MD       Or   ondansetron  (ZOFRAN ) injection 4 mg  4 mg Intravenous Q6H PRN Duncan, Hazel V, MD       piperacillin -tazobactam (ZOSYN ) IVPB 3.375 g  3.375 g Intravenous Q8H Sreeram, Narendranath, MD 12.5 mL/hr at 09/15/23 1428 3.375 g at 09/15/23 1428   senna (SENOKOT) tablet 8.6 mg  1 tablet Oral Daily Sreeram, Narendranath, MD   8.6 mg at 09/15/23 1212   sertraline  (ZOLOFT ) tablet 50 mg  50 mg Oral Daily Djan, Prince T, MD   50 mg at 09/15/23 1213     Discharge Medications: Please see discharge summary for a list of discharge medications.  Relevant Imaging Results:  Relevant Lab Results:   Additional Information ss [id number]  Corean ONEIDA Haddock, RN

## 2023-09-15 NOTE — Plan of Care (Signed)
  Problem: Education: Goal: Knowledge of General Education information will improve Description: Including pain rating scale, medication(s)/side effects and non-pharmacologic comfort measures 09/15/2023 0753 by Steve Lonell PARAS, RN Outcome: Progressing 09/15/2023 0753 by Steve Lonell PARAS, RN Outcome: Progressing   Problem: Health Behavior/Discharge Planning: Goal: Ability to manage health-related needs will improve 09/15/2023 0753 by Steve Lonell PARAS, RN Outcome: Progressing 09/15/2023 0753 by Steve Lonell PARAS, RN Outcome: Progressing   Problem: Clinical Measurements: Goal: Ability to maintain clinical measurements within normal limits will improve 09/15/2023 0753 by Steve Lonell PARAS, RN Outcome: Progressing 09/15/2023 0753 by Steve Lonell PARAS, RN Outcome: Progressing Goal: Will remain free from infection 09/15/2023 0753 by Steve Lonell PARAS, RN Outcome: Progressing 09/15/2023 0753 by Steve Lonell PARAS, RN Outcome: Progressing Goal: Diagnostic test results will improve 09/15/2023 0753 by Steve Lonell PARAS, RN Outcome: Progressing 09/15/2023 0753 by Steve Lonell PARAS, RN Outcome: Progressing Goal: Respiratory complications will improve 09/15/2023 0753 by Steve Lonell PARAS, RN Outcome: Progressing 09/15/2023 0753 by Steve Lonell PARAS, RN Outcome: Progressing Goal: Cardiovascular complication will be avoided 09/15/2023 0753 by Steve Lonell PARAS, RN Outcome: Progressing 09/15/2023 0753 by Steve Lonell PARAS, RN Outcome: Progressing   Problem: Activity: Goal: Risk for activity intolerance will decrease 09/15/2023 0753 by Steve Lonell PARAS, RN Outcome: Progressing 09/15/2023 0753 by Steve Lonell PARAS, RN Outcome: Progressing   Problem: Coping: Goal: Level of anxiety will decrease 09/15/2023 0753 by Steve Lonell PARAS, RN Outcome:  Progressing 09/15/2023 0753 by Steve Lonell PARAS, RN Outcome: Progressing   Problem: Elimination: Goal: Will not experience complications related to bowel motility 09/15/2023 0753 by Steve Lonell PARAS, RN Outcome: Progressing 09/15/2023 0753 by Steve Lonell PARAS, RN Outcome: Progressing Goal: Will not experience complications related to urinary retention 09/15/2023 0753 by Steve Lonell PARAS, RN Outcome: Progressing 09/15/2023 0753 by Steve Lonell PARAS, RN Outcome: Progressing   Problem: Pain Management: Goal: General experience of comfort will improve 09/15/2023 0753 by Steve Lonell PARAS, RN Outcome: Progressing 09/15/2023 0753 by Steve Lonell PARAS, RN Outcome: Progressing   Problem: Safety: Goal: Ability to remain free from injury will improve 09/15/2023 0753 by Steve Lonell PARAS, RN Outcome: Progressing 09/15/2023 0753 by Steve Lonell PARAS, RN Outcome: Progressing   Problem: Skin Integrity: Goal: Risk for impaired skin integrity will decrease 09/15/2023 0753 by Steve Lonell PARAS, RN Outcome: Progressing 09/15/2023 0753 by Steve Lonell PARAS, RN Outcome: Progressing   Problem: Clinical Measurements: Goal: Ability to avoid or minimize complications of infection will improve 09/15/2023 0753 by Steve Lonell PARAS, RN Outcome: Progressing 09/15/2023 0753 by Steve Lonell PARAS, RN Outcome: Progressing   Problem: Skin Integrity: Goal: Skin integrity will improve 09/15/2023 0753 by Steve Lonell PARAS, RN Outcome: Progressing 09/15/2023 0753 by Steve Lonell PARAS, RN Outcome: Progressing

## 2023-09-15 NOTE — Plan of Care (Signed)
  Problem: Education: Goal: Knowledge of General Education information will improve Description: Including pain rating scale, medication(s)/side effects and non-pharmacologic comfort measures Outcome: Progressing   Problem: Health Behavior/Discharge Planning: Goal: Ability to manage health-related needs will improve Outcome: Progressing   Problem: Clinical Measurements: Goal: Ability to maintain clinical measurements within normal limits will improve Outcome: Progressing Goal: Will remain free from infection Outcome: Progressing Goal: Diagnostic test results will improve Outcome: Progressing Goal: Respiratory complications will improve Outcome: Progressing Goal: Cardiovascular complication will be avoided Outcome: Progressing   Problem: Activity: Goal: Risk for activity intolerance will decrease Outcome: Progressing   Problem: Nutrition: Goal: Adequate nutrition will be maintained Outcome: Progressing   Problem: Coping: Goal: Level of anxiety will decrease Outcome: Progressing   Problem: Elimination: Goal: Will not experience complications related to bowel motility Outcome: Progressing Goal: Will not experience complications related to urinary retention Outcome: Progressing   Problem: Pain Management: Goal: General experience of comfort will improve Outcome: Progressing   Problem: Safety: Goal: Ability to remain free from injury will improve Outcome: Progressing   Problem: Skin Integrity: Goal: Risk for impaired skin integrity will decrease Outcome: Progressing   Problem: Clinical Measurements: Goal: Ability to avoid or minimize complications of infection will improve Outcome: Progressing   Problem: Skin Integrity: Goal: Skin integrity will improve Outcome: Progressing

## 2023-09-15 NOTE — TOC Progression Note (Signed)
 Transition of Care [facility]) - Progression Note    Patient Details  Name: Danielle [name] MRN: [id number] Date of Birth: 30-Mar-1929  Transition [facility] CM/SW Contact  Corean ONEIDA Haddock, RN Phone Number: 09/15/2023, 4:20 PM  Clinical Narrative:     Call and spoke with daughter Danielle [name] She confirms the would like LTC placement, but are only willing to have bed search in [facility].  She is aware that we may not get any bed offers in [place].  She states if there is not a bed in [place] she will plan to take patient home.    PASRR pending. FL2 sent to MD to sign to be uploaded to [facility] Bed search started        Expected Discharge Plan and Services                                               Social Determinants of Health (SDOH) Interventions SDOH Screenings   Food Insecurity: No Food Insecurity (09/09/2023)  Housing: Low Risk  (09/09/2023)  Transportation Needs: No Transportation Needs (09/09/2023)  Utilities: Not At Risk (09/09/2023)  Alcohol Screen: Low Risk  (10/06/2018)  Depression (PHQ2-9): Low Risk  (10/06/2018)  Social Connections: Socially Isolated (09/09/2023)  Tobacco Use: Low Risk  (09/09/2023)    Readmission Risk Interventions     No data to display

## 2023-09-15 NOTE — Progress Notes (Signed)
 Pt receiving antibiotics every 8 hrs. 1 unit of blood ordered. Primary RN to infuse blood between scheduled antibiotics.

## 2023-09-15 NOTE — Progress Notes (Signed)
 Progress Note   Patient: Danielle [name] FMW:[id number] DOB: 02/15/1929 DOA: 09/07/2023     7 DOS: the patient was seen and examined on 09/15/2023   Brief hospital course: Danielle [name] is a 89 y.o. female with medical history significant for HTN, CAD, hypothyroidism, depression, COPD, OSA on CPAP, osteoarthritis, nonambulant at baseline and total care dependent with unknown left ischial sacral decubitus, managed by home health nurse who was brought to the ED with a 1 week history of weakness and in the past 2 days, altered mental status described as talking out of her head   Patient treated with Rocephin  and vancomycin  for infected decubitus ulcer and given a fluid bolus. She is admitted to hospitalist service for infected decubitus ulcer for IV antibiotics. She refused surgical debridement, palliative consulted for goals of care discussion with her and family. Antibiotic changed to Zosyn  after Amoxicillin  challenge.   Assessment and Plan: Infected decubitus ulcer, buttock abscess No sepsis. Culture results reviewed. Multiple organisms E. coli, Morganella, Enterococcus.  Sensitivities reviewed. Discussed with pharmacist, she tolerated Amox challenge. Antibiotics changed to Zosyn  therapy. She can go on Augmentin  oral for 10 days at discharge. General Surgery advised I&D. Family did not wish to have I&D. Palliative care discussed goals of care with patient and family. Family wishes her to go SNF with palliative care.   Acute on Chronic anemia Suspect chronic blood loss from wound Iron 24, ferritin high, folate normal, B12 high H&H 6.7 09/14/23 got 1 unit of blood transfusion. Repeat Hb 9.4, Continue iron supplementation. Monitor H/H and transfuse as needed.   AKI (acute kidney injury) (HCC) Dehydration Suspect ATN from dehydration related to poor oral intake Renal function improved. Monitor renal function and avoid nephrotoxins.  Acute metabolic encephalopathy-improved In the setting  of acute infection and dehydration Recent forgetfulness but no history of dementia, per daughter. Mental status much improved.  Able to answer me appropriately. Continue delirium precaution. Continue Aricept .   Hypertension: BP stable. Continue Norvasc  as imdur  can't be crushed.   Osteoarthritis of knees, bilateral Bedbound status secondary to osteoarthritis Advanced age and frailty/functional quadriplegia Patient is total care dependent Patient receives knee injections due to chronic pain Continue acetaminophen , cyclobenzaprine . Continue pain control. PT OT advised long-term care.   OSA on CPAP Continue CPAP nightly   Depression Continue sertraline    Hypothyroidism, unspecified Continue home dose levothyroxine  150mcg.   Chronic obstructive pulmonary disease (HCC) Not acutely exacerbated Continue albuterol  as needed Follow up with pulmonology as outpatient.   Severe malnutrition: In the setting of chronic illness. Encourage oral diet, supplements. Nutrition Documentation    Flowsheet Row ED to Hosp-Admission (Current) from [facility]/07/2023 in [facility]  Nutrition Problem Severe Malnutrition  Etiology chronic illness  Nutrition Goal Patient will meet greater than or equal to 90% of their needs     ,  Active Pressure Injury/Wound(s)     Pressure Ulcer  Duration          Pressure Injury 09/07/23 Buttocks Left Stage 3 -  Full thickness tissue loss. Subcutaneous fat may be visible but bone, tendon or muscle are NOT exposed. 7 days          Continue wound care.  Out of bed to chair. Incentive spirometry. Nursing supportive care. Fall, aspiration precautions. DVT prophylaxis Lovenox    Code Status: Limited: Do not attempt resuscitation (DNR) -DNR-LIMITED -Do Not Intubate/DNI  Palliative on board for goals of care discussion.  Subjective: Patient is seen and examined today  morning. She is able to answer me, eating better. Family  at bedside. Discussed about need for blood, she understands.   Physical Exam: Vitals:   09/15/23 0059 09/15/23 0420 09/15/23 0600 09/15/23 0952  BP: (!) 115/43 (!) 134/50  (!) 123/49  Pulse: (!) 57 60  82  Resp: 18 17    Temp: 98.3 F (36.8 C) 98.1 F (36.7 C)  98.1 F (36.7 C)  TempSrc: Oral Axillary  Oral  SpO2: 100% 100%  94%  Weight:   69.6 kg   Height:        General - Elderly thin built Caucasian female, no apparent distress HEENT - PERRLA, EOMI, atraumatic head, non tender sinuses. Lung - Clear, basal rales, rhonchi, no wheezes. Heart - S1, S2 heard, no murmurs, rubs, trace pedal edema. Abdomen - Soft, non tender, bowel sounds good Neuro - Alert, awake, oriented, non focal exam. Skin - Warm and dry.  Data Reviewed:      Latest Ref Rng & Units 09/15/2023    5:16 AM 09/14/2023   12:48 PM 09/13/2023    2:16 PM  CBC  WBC 4.0 - 10.5 K/uL 5.3     Hemoglobin 12.0 - 15.0 g/dL 9.4  6.7  7.9   Hematocrit 36.0 - 46.0 % 28.3  19.7  23.7   Platelets 150 - 400 K/uL 164         Latest Ref Rng & Units 09/12/2023    4:54 AM 09/11/2023    5:18 AM 09/10/2023    5:59 AM  BMP  Glucose 70 - 99 mg/dL 90  91  85   BUN 8 - 23 mg/dL 24  21  20    Creatinine 0.44 - 1.00 mg/dL 9.09  9.14  9.14   Sodium 135 - 145 mmol/L 135  136  140   Potassium 3.5 - 5.1 mmol/L 3.8  3.3  3.8   Chloride 98 - 111 mmol/L 108  105  107   CO2 22 - 32 mmol/L 21  21  24    Calcium 8.9 - 10.3 mg/dL 8.5  8.4  9.4    No results found.  Family Communication: Discussed with family at bedside, they understand and agree. All questions answereed.  Disposition: Status is: Inpatient Remains inpatient appropriate because: IV antibioitcs, monitor h/h, placement.  Planned Discharge Destination: Home with Home Health and Skilled nursing facility     Time spent: 37 minutes  Author: Concepcion Riser, MD 09/15/2023 3:23 PM Secure chat 7am to 7pm For on call review [email or web address].

## 2023-09-16 DIAGNOSIS — R532 Functional quadriplegia: Secondary | ICD-10-CM | POA: Diagnosis not present

## 2023-09-16 DIAGNOSIS — Z7189 Other specified counseling: Secondary | ICD-10-CM | POA: Diagnosis not present

## 2023-09-16 DIAGNOSIS — N179 Acute kidney failure, unspecified: Secondary | ICD-10-CM | POA: Diagnosis not present

## 2023-09-16 DIAGNOSIS — G9341 Metabolic encephalopathy: Secondary | ICD-10-CM | POA: Diagnosis not present

## 2023-09-16 DIAGNOSIS — Z7401 Bed confinement status: Secondary | ICD-10-CM | POA: Diagnosis not present

## 2023-09-16 NOTE — Progress Notes (Signed)
 Nutrition Follow Up Note   DOCUMENTATION CODES:   Severe malnutrition in context of chronic illness  INTERVENTION:   Ensure Enlive po TID, each supplement provides 350 kcal and 20 grams of protein.  Magic cup TID with meals, each supplement provides 290 kcal and 9 grams of protein  MVI po daily   Vitamin 500mg  po BID  Dysphagia 3 diet   Daily weights   NUTRITION DIAGNOSIS:   Severe Malnutrition related to chronic illness as evidenced by severe fat depletion, severe muscle depletion. -ongoing   GOAL:   Patient will meet greater than or equal to 90% of their needs -progressing   MONITOR:   PO intake, Supplement acceptance, Labs, Weight trends, I & O's, Skin  ASSESSMENT:   89 y/o female with h/o HTN, COPD, hypothyroidism, depression, anxiety, OSA, bedbound with functional quadriplegia, CAD, HLD, MI and chronic left buttock wound who is admitted with left gluteal abscess, AKI, AMS and anemia now s/p I & D 1/6.  Pt is more alert. Pt with improved appetite and oral intake. Pt documented to be eating 10-75% of meals; pt ate 75% of her breakfast this morning. Pt is drinking the Ensure supplements. Refeed labs stable. Per chart, appears fairly weight stable since admission. Plan is for rehab at discharge.     Medications reviewed and include: vitamin C , lovenox , synthroid , ferrous sulfate , synthroid , MVI, senokot, zosyn    Labs reviewed: K 3.8 wnl Hgb 9.4(L), Hct 28.3(L)  Diet Order:   Diet Order             DIET DYS 3 Room service appropriate? Yes with Assist; Fluid consistency: Thin  Diet effective now                  EDUCATION NEEDS:   Education needs have been addressed  Skin:  Skin Assessment: Reviewed RN Assessment (Stage III L buttocks 2X5X1cm, skin tears)  Last BM:  1/14- TYPE 7  Height:   Ht Readings from Last 1 Encounters:  09/07/23 5' 3 (1.6 m)    Weight:   Wt Readings from Last 1 Encounters:  09/16/23 108.9 kg    Ideal Body Weight:  52  kg  BMI:  Body mass index is 42.51 kg/m.  Estimated Nutritional Needs:   Kcal:  1600-1800kcal/day  Protein:  80-90g/day  Fluid:  1.4-1.6L/day  Augustin Shams MS, RD, LDN If unable to be reached, please send secure chat to RD inpatient available from 8:00a-4:00p daily

## 2023-09-16 NOTE — TOC Progression Note (Signed)
 Transition of Care [facility]) - Progression Note    Patient Details  Name: Danielle [name] MRN: [id number] Date of Birth: 06-22-1929  Transition of Care [facility]) CM/SW Contact  Corean ONEIDA Haddock, RN Phone Number: 09/16/2023, 11:02 AM  Clinical Narrative:     Bed offer received from [facility].  [facility] to call daughter to discuss to confirm, before the officially offer bed   Clinical uploaded to [facility].  PASRR pending       Expected Discharge Plan and Services                                               Social Determinants of Health (SDOH) Interventions SDOH Screenings   Food Insecurity: No Food Insecurity (09/09/2023)  Housing: Low Risk  (09/09/2023)  Transportation Needs: No Transportation Needs (09/09/2023)  Utilities: Not At Risk (09/09/2023)  Alcohol Screen: Low Risk  (10/06/2018)  Depression (PHQ2-9): Low Risk  (10/06/2018)  Social Connections: Socially Isolated (09/09/2023)  Tobacco Use: Low Risk  (09/09/2023)    Readmission Risk Interventions     No data to display

## 2023-09-16 NOTE — Progress Notes (Signed)
 Progress Note   Patient: Danielle [name] FMW:[id number] DOB: [birth year] DOA: 09/07/2023     8 DOS: the patient was seen and examined on 09/16/2023   Brief hospital course: Danielle [name] is a 89 y.o. female with medical history significant for HTN, CAD, hypothyroidism, depression, COPD, OSA on CPAP, osteoarthritis, nonambulant at baseline and total care dependent with unknown left ischial sacral decubitus, managed by home health nurse who was brought to the ED with a 1 week history of weakness and in the past 2 days, altered mental status described as talking out of her head   Patient treated with Rocephin  and vancomycin  for infected decubitus ulcer and given a fluid bolus. She is admitted to hospitalist service for infected decubitus ulcer for IV antibiotics. She refused surgical debridement, palliative consulted for goals of care discussion with her and family. Antibiotic changed to Zosyn  after Amoxicillin  challenge.   Assessment and Plan: Infected decubitus ulcer, buttock abscess No sepsis. Culture results reviewed. Multiple organisms E. coli, Morganella, Enterococcus.  Sensitivities reviewed. Discussed with pharmacist, she tolerated Amox challenge. Antibiotics changed to Zosyn  therapy1/10/25. She can go on Augmentin , cipro  oral for 10 days at discharge. General Surgery advised I&D. Family did not wish to have I&D. Palliative care discussed goals of care with patient and family. Family wishes her to go SNF with palliative care.   Acute on Chronic anemia Suspect chronic blood loss from wound Iron 24, ferritin high, folate normal, B12 high H&H 6.7 09/14/23 got 1 unit of blood transfusion. Repeat Hb 9.4, Continue iron supplementation. Repeat H/H for tomorrow.   AKI (acute kidney injury) (HCC) Dehydration Suspect ATN from dehydration related to poor oral intake Renal function improved. Avoid nephrotoxins.  She is eating better. Encourage oral fluids, supplements.  Acute metabolic  encephalopathy-improved In the setting of acute infection and dehydration Recent forgetfulness but no history of dementia, per daughter. Mental status much improved.  Able to answer me appropriately. Continue delirium precaution. Continue Aricept .   Hypertension: BP stable. Continue Norvasc  as imdur  can't be crushed.   Osteoarthritis of knees, bilateral Bedbound status secondary to osteoarthritis Advanced age and frailty/functional quadriplegia Patient is total care dependent Patient receives knee injections due to chronic pain Continue acetaminophen , cyclobenzaprine . Continue pain control. PT OT advised long-term care. Family wishes her to be in local facility, [facility] working on placement.   OSA on CPAP Continue CPAP nightly   Depression Continue sertraline    Hypothyroidism, unspecified Continue home dose levothyroxine  150mcg.   Chronic obstructive pulmonary disease (HCC) Not acutely exacerbated Continue albuterol  as needed Follow up with pulmonology as outpatient.   Severe malnutrition: In the setting of chronic illness. Encourage oral diet, supplements. Nutrition Documentation    Flowsheet Row ED to Hosp-Admission (Current) from 09/07/2023 in [facility] GENER[facility]  Nutrition Problem Severe Malnutrition  Etiology chronic illness  Nutrition Goal Patient will meet greater than or equal to 90% of their needs     ,  Active Pressure Injury/Wound(s)     Pressure Ulcer  Duration          Pressure Injury 09/07/23 Buttocks Left Stage 3 -  Full thickness tissue loss. Subcutaneous fat may be visible but bone, tendon or muscle are NOT exposed. 7 days          Continue wound care.  Out of bed to chair. Incentive spirometry. Nursing supportive care. Fall, aspiration precautions. DVT prophylaxis Lovenox    Code Status: Limited: Do not attempt resuscitation (DNR) -DNR-LIMITED -Do Not Intubate/DNI  Palliative on board for goals of care  discussion.  Subjective: Patient is seen and examined today morning. She is able to answer me, RN at bedside giving her medications.  She is eating better as per family at bedside.  She is waiting placement.  Physical Exam: Vitals:   09/15/23 2015 09/16/23 0411 09/16/23 0500 09/16/23 0732  BP: (!) 125/56 (!) 131/56  (!) 159/54  Pulse: 64 (!) 59  (!) 55  Resp: 20 20  16   Temp: 97.8 F (36.6 C) 98.1 F (36.7 C)  97.7 F (36.5 C)  TempSrc: Oral Oral  Oral  SpO2: 100% 100%  95%  Weight:   108.9 kg   Height:        General - Elderly thin built Caucasian female, no apparent distress HEENT - PERRLA, EOMI, atraumatic head, non tender sinuses. Lung - Clear, basal rales, rhonchi, no wheezes. Heart - S1, S2 heard, no murmurs, rubs, trace pedal edema. Abdomen - Soft, non tender, bowel sounds good Neuro - Alert, awake, oriented, non focal exam. Skin - Warm and dry.  Decubitus ulcers dressing noted  Data Reviewed:      Latest Ref Rng & Units 09/15/2023    5:16 AM 09/14/2023   12:48 PM 09/13/2023    2:16 PM  CBC  WBC 4.0 - 10.5 K/uL 5.3     Hemoglobin 12.0 - 15.0 g/dL 9.4  6.7  7.9   Hematocrit 36.0 - 46.0 % 28.3  19.7  23.7   Platelets 150 - 400 K/uL 164         Latest Ref Rng & Units 09/12/2023    4:54 AM 09/11/2023    5:18 AM 09/10/2023    5:59 AM  BMP  Glucose 70 - 99 mg/dL 90  91  85   BUN 8 - 23 mg/dL 24  21  20    Creatinine 0.44 - 1.00 mg/dL 9.09  9.14  9.14   Sodium 135 - 145 mmol/L 135  136  140   Potassium 3.5 - 5.1 mmol/L 3.8  3.3  3.8   Chloride 98 - 111 mmol/L 108  105  107   CO2 22 - 32 mmol/L 21  21  24    Calcium 8.9 - 10.3 mg/dL 8.5  8.4  9.4    No results found.  Family Communication: Discussed with family at bedside, they understand and agree. All questions answereed.  Disposition: Status is: Inpatient Remains inpatient appropriate because: TOC working on placement.  Planned Discharge Destination: Home with Home Health and Skilled nursing  facility     Time spent: 38 minutes  Author: Concepcion Riser, MD 09/16/2023 5:04 PM Secure chat 7am to 7pm For on call review [email or web address].

## 2023-09-16 NOTE — TOC Progression Note (Signed)
 Transition of Care [facility]) - Progression Note    Patient Details  Name: Danielle [name] MRN: [id number] Date of Birth: 12-08-1928  [facility]) CM/SW Contact  Corean ONEIDA Haddock, RN Phone Number: 09/16/2023, 3:21 PM  Clinical Narrative:      PASRR obtained [phone] [id number]  [facility] and Daughter still working together to determine if [facility] is able to offer a bed . They are reviewing her Medicaid benefits       Expected Discharge Plan and Services                                               Social Determinants of Health (SDOH) Interventions SDOH Screenings   Food Insecurity: No Food Insecurity (09/09/2023)  Housing: Low Risk  (09/09/2023)  Transportation Needs: No Transportation Needs (09/09/2023)  Utilities: Not At Risk (09/09/2023)  Alcohol Screen: Low Risk  (10/06/2018)  Depression (PHQ2-9): Low Risk  (10/06/2018)  Social Connections: Socially Isolated (09/09/2023)  Tobacco Use: Low Risk  (09/09/2023)    Readmission Risk Interventions     No data to display

## 2023-09-16 NOTE — Progress Notes (Addendum)
 Daily Progress Note   Patient Name: Danielle [name]       Date: 09/16/2023 DOB: 1929-04-04  Age: 89 y.o. MRN#: [id number] Attending Physician: Darci Pore, MD Primary Care Physician: Tobie Domino, MD Admit Date: 09/07/2023  Reason for Consultation/Follow-up: Establishing goals of care  Subjective: Notes and labs reviewed.  In to see patient.  She is currently resting in bed with her son at bedside, who is currently talking on the phone.  Patient states she is feeling better.  She states she understands the plan is for skilled nursing and is amenable to this plan.  Son had his sister (patient's daughter) on speaker phone.  We discussed her baseline physical status and current status.  We discussed the importance of solid sustainable nutrition and hydration for wound healing.  We discussed her diagnoses and different scenarios moving forward.  We discussed an aggressive path and a comfort path.  Family is amenable to rehab placement as this is what the patient wants.  They understand that she is currently a candidate for hospice at home if she were to choose this route.  Length of Stay: 8  Current Medications: Scheduled Meds:   amLODipine   5 mg Oral Daily   vitamin C   500 mg Oral BID   diclofenac  Sodium  2 g Topical QID   donepezil   5 mg Oral QHS   enoxaparin  (LOVENOX ) injection  40 mg Subcutaneous Q24H   feeding supplement  237 mL Oral TID BM   ferrous sulfate   325 mg Oral Q breakfast   levothyroxine   150 mcg Oral Q0600   mirabegron  ER  25 mg Oral Daily   multivitamin with minerals  1 tablet Oral Daily   senna  1 tablet Oral Daily   sertraline   50 mg Oral Daily    Continuous Infusions:  piperacillin -tazobactam (ZOSYN )  IV 3.375 g (09/16/23 0616)    PRN Meds: acetaminophen   **OR** acetaminophen , alum & mag hydroxide-simeth, cyclobenzaprine , diphenhydrAMINE , EPINEPHrine , HYDROcodone -acetaminophen , iohexol , ondansetron  **OR** ondansetron  (ZOFRAN ) IV  Physical Exam Pulmonary:     Effort: Pulmonary effort is normal.  Neurological:     Mental Status: She is alert.             Vital Signs: BP (!) 159/54 (BP Location: Left Arm)   Pulse (!) 55   Temp 97.7 F (  36.5 C) (Oral)   Resp 16   Ht 5' 3 (1.6 m)   Wt 108.9 kg   SpO2 95%   BMI 42.51 kg/m  SpO2: SpO2: 95 % O2 Device: O2 Device: Room Air O2 Flow Rate:    Intake/output summary:  Intake/Output Summary (Last 24 hours) at 09/16/2023 1217 Last data filed at 09/16/2023 0900 Gross per 24 hour  Intake 360 ml  Output --  Net 360 ml   LBM: Last BM Date : 09/15/23 Baseline Weight: Weight: 66.7 kg Most recent weight: Weight: 108.9 kg    Patient Active Problem List   Diagnosis Date Noted   Protein-calorie malnutrition, severe 09/09/2023   Infected decubitus ulcer 09/08/2023   Acute metabolic encephalopathy 09/08/2023   AKI (acute kidney injury) (HCC) 09/08/2023   Bedbound 09/08/2023   Advanced age 24/02/2024   OSA on CPAP 09/08/2023   Functional quadriplegia (HCC) 09/08/2023   Chronic anemia 09/08/2023   Sacral wound, initial encounter 09/08/2023   Depression    Osteoarthritis of knees, bilateral 12/30/2022   Constipation 02/26/2021   Hypothyroidism, unspecified 12/13/2020   Chronic obstructive pulmonary disease (HCC) 09/02/2020   Pain due to onychomycosis of toenails of both feet 02/15/2019   Urge incontinence of urine 07/03/2017   Coronary arteriosclerosis in native artery 05/29/2014   CAD (coronary artery disease), native coronary artery 05/29/2014   Hyperlipidemia 05/29/2014   Hypertension 05/29/2014   Hyperlipidemia, unspecified 05/29/2014   Pain in extremity 06/21/2013   Onychomycosis due to dermatophyte 06/21/2013    Palliative Care Assessment & Plan      Recommendations/Plan: Continue current care.  Patient is amenable to rehab placement.  Family is aware patient is currently a candidate for hospice at home should she choose this route.   Code Status:    Code Status Orders  (From admission, onward)           Start     Ordered   09/08/23 0229  Do not attempt resuscitation (DNR)- Limited -Do Not Intubate (DNI)  Continuous       Question Answer Comment  If pulseless and not breathing No CPR or chest compressions.   In Pre-Arrest Conditions (Patient Is Breathing and Has A Pulse) Do not intubate. Provide all appropriate non-invasive medical interventions. Avoid ICU transfer unless indicated or required.   Consent: Discussion documented in EHR or advanced directives reviewed      09/08/23 0231           Code Status History     Date Active Date Inactive Code Status Order ID Comments User Context   02/26/2021 2025 02/27/2021 1944 DNR [id number]  Lawence Madison LABOR, MD ED   02/26/2021 1928 02/26/2021 2025 Full Code [id number]  Mansy, Madison LABOR, MD ED   08/09/2012 2232 08/11/2012 1538 Full Code [id number]  Colon Shove, MD Inpatient       Prognosis: Poor overall    Thank you for allowing the Palliative Medicine Team to assist in the care of this patient.    Camelia Lewis, NP  Please contact Palliative Medicine Team phone at [phone] for questions and concerns.

## 2023-09-16 NOTE — Plan of Care (Signed)
   Problem: Education: Goal: Knowledge of General Education information will improve Description: Including pain rating scale, medication(s)/side effects and non-pharmacologic comfort measures Outcome: Progressing   Problem: Health Behavior/Discharge Planning: Goal: Ability to manage health-related needs will improve Outcome: Not Progressing   Problem: Clinical Measurements: Goal: Ability to maintain clinical measurements within normal limits will improve Outcome: Progressing

## 2023-09-16 NOTE — Plan of Care (Signed)
  Problem: Education: Goal: Knowledge of General Education information will improve Description: Including pain rating scale, medication(s)/side effects and non-pharmacologic comfort measures Outcome: Progressing   Problem: Health Behavior/Discharge Planning: Goal: Ability to manage health-related needs will improve Outcome: Progressing   Problem: Clinical Measurements: Goal: Ability to maintain clinical measurements within normal limits will improve Outcome: Progressing Goal: Will remain free from infection Outcome: Progressing Goal: Diagnostic test results will improve Outcome: Progressing Goal: Respiratory complications will improve Outcome: Progressing Goal: Cardiovascular complication will be avoided Outcome: Progressing   Problem: Activity: Goal: Risk for activity intolerance will decrease Outcome: Progressing   Problem: Nutrition: Goal: Adequate nutrition will be maintained Outcome: Progressing   Problem: Coping: Goal: Level of anxiety will decrease Outcome: Progressing   Problem: Elimination: Goal: Will not experience complications related to bowel motility Outcome: Progressing Goal: Will not experience complications related to urinary retention Outcome: Progressing   Problem: Pain Management: Goal: General experience of comfort will improve Outcome: Progressing   Problem: Safety: Goal: Ability to remain free from injury will improve Outcome: Progressing   Problem: Skin Integrity: Goal: Risk for impaired skin integrity will decrease Outcome: Progressing   Problem: Clinical Measurements: Goal: Ability to avoid or minimize complications of infection will improve Outcome: Progressing   Problem: Skin Integrity: Goal: Skin integrity will improve Outcome: Progressing

## 2023-09-17 DIAGNOSIS — L8993 Pressure ulcer of unspecified site, stage 3: Secondary | ICD-10-CM | POA: Diagnosis not present

## 2023-09-17 DIAGNOSIS — L089 Local infection of the skin and subcutaneous tissue, unspecified: Secondary | ICD-10-CM | POA: Diagnosis not present

## 2023-09-17 LAB — HEMOGLOBIN AND HEMATOCRIT, BLOOD
HCT: 27.9 % — ABNORMAL LOW (ref 36.0–46.0)
Hemoglobin: 9 g/dL — ABNORMAL LOW (ref 12.0–15.0)

## 2023-09-17 LAB — PREPARE RBC (CROSSMATCH)

## 2023-09-17 NOTE — TOC Progression Note (Signed)
 Transition of Care [facility]) - Progression Note    Patient Details  Name: Danielle [name] MRN: [id number] Date of Birth: August 05, 1929  [facility]) CM/SW Contact  Loman Risk, RN Phone Number: 09/17/2023, 11:32 AM  Clinical Narrative:    Message sent to Abran Abrahams at [facility] to determine if they have everything thing they needs from daughter to move forward with official offer        Expected Discharge Plan and Services                                               Social Determinants of Health (SDOH) Interventions SDOH Screenings   Food Insecurity: No Food Insecurity (09/09/2023)  Housing: Low Risk  (09/09/2023)  Transportation Needs: No Transportation Needs (09/09/2023)  Utilities: Not At Risk (09/09/2023)  Alcohol Screen: Low Risk  (10/06/2018)  Depression (PHQ2-9): Low Risk  (10/06/2018)  Social Connections: Socially Isolated (09/09/2023)  Tobacco Use: Low Risk  (09/09/2023)    Readmission Risk Interventions     No data to display

## 2023-09-17 NOTE — Progress Notes (Signed)
 Progress Note    Danielle [name]  ZOX:[id number] DOB: [birth year]  DOA: 09/07/2023 PCP: Comer Decamp, MD      Brief Narrative:    Medical records reviewed and are as summarized below:  Danielle [name] is a 89 y.o. female with medical history significant for HTN, CAD, hypothyroidism, depression, COPD, OSA on CPAP, osteoarthritis, nonambulant at baseline and total care dependent with known left ischial and sacral decubitus ulcers, managed by home health nurse.  She was brought to the hospital because of general weakness of bowel 1 week duration and change in mental status ("talking out of her head") of about 2 days duration      Assessment/Plan:   Principal Problem:   Infected decubitus ulcer Active Problems:   Acute metabolic encephalopathy   AKI (acute kidney injury) (HCC)   Chronic anemia   Osteoarthritis of knees, bilateral   Bedbound   Advanced age   Coronary arteriosclerosis in native artery   Hypertension   Chronic obstructive pulmonary disease (HCC)   Hypothyroidism, unspecified   Depression   OSA on CPAP   Functional quadriplegia (HCC)   Sacral wound, initial encounter   Protein-calorie malnutrition, severe   Nutrition Problem: Severe Malnutrition Etiology: chronic illness  Signs/Symptoms: severe fat depletion, severe muscle depletion   Body mass index is 41.27 kg/m.  (Morbid obesity)   Infected decubitus ulcer, buttock abscess Wound culture showed multiple organisms E. coli, Morganella, Enterococcus faecalis and Streptococcus agalactiae.   Continue IV Zosyn  and switch to Augmentin  and ciprofloxacin  on 07/18/2024 for 10 more days.  She tolerated amoxicillin  challenge. S/p I&D of left buttock abscess on 09/12/2023. General surgery recommended more extensive debridement of infected decubitus ulcers with family declined.  Family wants her to go to SNF on palliative care.    Acute on Chronic anemia Suspect chronic blood loss from wound Iron 24,  ferritin high, folate normal, B12 high H&H 6.7 09/14/23 got 1 unit of blood transfusion. H&H is stable.  Continue iron supplement.   AKI (acute kidney injury) (HCC) Dehydration Improved Avoid nephrotoxins.   Encourage oral fluids, supplements.    Acute metabolic encephalopathy-improved Continue delirium precaution. Continue Aricept .    Hypertension: BP stable. Continue amlodipine    Hypokalemia Improved   Osteoarthritis of knees, bilateral Bedbound status secondary to osteoarthritis Advanced age and frailty/functional quadriplegia Patient is total care dependent Patient had been receiving knee injections due to chronic pain Analgesics as needed. PT OT advised long-term care. Family wishes her to be in local facility, [facility] working on placement.    Comorbidities include COPD, OSA on CPAP, depression, hypothyroidism       Diet Order             DIET DYS 3 Room service appropriate? Yes with Assist; Fluid consistency: Thin  Diet effective now                            Consultants: General surgeon Palliative care  Procedures: I&D of left posterior thigh abscess    Medications:    amLODipine   5 mg Oral Daily   vitamin C   500 mg Oral BID   diclofenac  Sodium  2 g Topical QID   donepezil   5 mg Oral QHS   enoxaparin  (LOVENOX ) injection  40 mg Subcutaneous Q24H   feeding supplement  237 mL Oral TID BM   ferrous sulfate   325 mg Oral Q breakfast   levothyroxine   150 mcg Oral Q0600  mirabegron  ER  25 mg Oral Daily   multivitamin with minerals  1 tablet Oral Daily   senna  1 tablet Oral Daily   sertraline   50 mg Oral Daily   Continuous Infusions:  piperacillin -tazobactam (ZOSYN )  IV 3.375 g (09/17/23 0552)     Anti-infectives (From admission, onward)    Start     Dose/Rate Route Frequency Ordered Stop   09/12/23 2200  piperacillin -tazobactam (ZOSYN ) IVPB 3.375 g        3.375 g 12.5 mL/hr over 240 Minutes Intravenous Every 8 hours  09/12/23 1544     09/12/23 1400  amoxicillin  (AMOXIL ) capsule 500 mg        500 mg Oral  Once 09/12/23 1256 09/12/23 1436   09/11/23 1800  ceFEPIme  (MAXIPIME ) 2 g in sodium chloride  0.9 % 100 mL IVPB  Status:  Discontinued        2 g 200 mL/hr over 30 Minutes Intravenous Every 12 hours 09/11/23 1553 09/12/23 1544   09/09/23 0900  vancomycin  (VANCOREADY) IVPB 750 mg/150 mL  Status:  Discontinued        750 mg 150 mL/hr over 60 Minutes Intravenous Every 24 hours 09/09/23 0800 09/12/23 1544   09/08/23 2300  cefTRIAXone  (ROCEPHIN ) 1 g in sodium chloride  0.9 % 100 mL IVPB  Status:  Discontinued        1 g 200 mL/hr over 30 Minutes Intravenous Every 24 hours 09/08/23 0231 09/11/23 1553   09/08/23 0300  vancomycin  variable dose per unstable renal function (pharmacist dosing)  Status:  Discontinued         Does not apply See admin instructions 09/08/23 0300 09/09/23 0800   09/08/23 0300  vancomycin  (VANCOREADY) IVPB 500 mg/100 mL        500 mg 100 mL/hr over 60 Minutes Intravenous  Once 09/08/23 0257 09/08/23 0448   09/07/23 2245  cefTRIAXone  (ROCEPHIN ) 2 g in sodium chloride  0.9 % 100 mL IVPB        2 g 200 mL/hr over 30 Minutes Intravenous Once 09/07/23 2240 09/07/23 2331   09/07/23 2245  vancomycin  (VANCOCIN ) IVPB 1000 mg/200 mL premix        1,000 mg 200 mL/hr over 60 Minutes Intravenous  Once 09/07/23 2240 09/08/23 0044              Family Communication/Anticipated D/C date and plan/Code Status   DVT prophylaxis: enoxaparin  (LOVENOX ) injection 40 mg Start: 09/10/23 0800     Code Status: Limited: Do not attempt resuscitation (DNR) -DNR-LIMITED -Do Not Intubate/DNI   Family Communication: Plan discussed with Adaline Holly, son, at the bedside Disposition Plan: Plan to discharge to long-term care facility   Status is: Inpatient Remains inpatient appropriate because: Awaiting placement       Subjective:   Interval events noted.  She complains of pain in the left buttock.   Adaline Holly, son, was at the bedside  Objective:    Vitals:   09/16/23 1855 09/17/23 0225 09/17/23 0500 09/17/23 0728  BP: (!) 120/55 (!) 167/52  (!) 138/39  Pulse: 60 74  (!) 101  Resp: 16 16  18   Temp: 98.8 F (37.1 C) 98.5 F (36.9 C)  97.8 F (36.6 C)  TempSrc:  Oral  Oral  SpO2: 100% 99%  99%  Weight:   105.7 kg   Height:       No data found.   Intake/Output Summary (Last 24 hours) at 09/17/2023 1106 Last data filed at 09/16/2023 2155 Gross per 24 hour  Intake 682.38 ml  Output --  Net 682.38 ml   Filed Weights   09/15/23 0600 09/16/23 0500 09/17/23 0500  Weight: 69.6 kg 108.9 kg 105.7 kg    Exam:  GEN: NAD SKIN: Warm and dry.  There was some purulent drainage from the left posterior buttock ulcer EYES: No pallor or icterus ENT: MMM CV: RRR PULM: CTA B ABD: soft, ND, NT, +BS CNS: AAO x 3, non focal EXT: No edema or tenderness            Data Reviewed:   I have personally reviewed following labs and imaging studies:  Labs: Labs show the following:   Basic Metabolic Panel: Recent Labs  Lab 09/11/23 0518 09/12/23 0454  NA 136 135  K 3.3* 3.8  CL 105 108  CO2 21* 21*  GLUCOSE 91 90  BUN 21 24*  CREATININE 0.85 0.90  CALCIUM 8.4* 8.5*  MG 1.9 1.8  PHOS 3.0 2.7   GFR Estimated Creatinine Clearance: 46.4 mL/min (by C-G formula based on SCr of 0.9 mg/dL). Liver Function Tests: No results for input(s): "AST", "ALT", "ALKPHOS", "BILITOT", "PROT", "ALBUMIN" in the last 168 hours. No results for input(s): "LIPASE", "AMYLASE" in the last 168 hours. No results for input(s): "AMMONIA" in the last 168 hours. Coagulation profile No results for input(s): "INR", "PROTIME" in the last 168 hours.  CBC: Recent Labs  Lab 09/11/23 0518 09/12/23 0454 09/13/23 1416 09/14/23 1248 09/15/23 0516 09/17/23 0529  WBC 6.9 5.9  --   --  5.3  --   NEUTROABS 5.6  --   --   --   --   --   HGB 7.1* 7.5* 7.9* 6.7* 9.4* 9.0*  HCT 21.3* 22.1* 23.7* 19.7* 28.3*  27.9*  MCV 92.2 90.6  --   --  91.9  --   PLT 158 171  --   --  164  --    Cardiac Enzymes: No results for input(s): "CKTOTAL", "CKMB", "CKMBINDEX", "TROPONINI" in the last 168 hours. BNP (last 3 results) No results for input(s): "PROBNP" in the last 8760 hours. CBG: No results for input(s): "GLUCAP" in the last 168 hours. D-Dimer: No results for input(s): "DDIMER" in the last 72 hours. Hgb A1c: No results for input(s): "HGBA1C" in the last 72 hours. Lipid Profile: No results for input(s): "CHOL", "HDL", "LDLCALC", "TRIG", "CHOLHDL", "LDLDIRECT" in the last 72 hours. Thyroid  function studies: No results for input(s): "TSH", "T4TOTAL", "T3FREE", "THYROIDAB" in the last 72 hours.  Invalid input(s): "FREET3" Anemia work up: No results for input(s): "VITAMINB12", "FOLATE", "FERRITIN", "TIBC", "IRON", "RETICCTPCT" in the last 72 hours. Sepsis Labs: Recent Labs  Lab 09/11/23 0518 09/12/23 0454 09/15/23 0516  WBC 6.9 5.9 5.3    Microbiology Recent Results (from the past 240 hours)  Resp panel by RT-PCR (RSV, Flu A&B, Covid) Anterior Nasal Swab     Status: None   Collection Time: 09/07/23  7:15 PM   Specimen: Anterior Nasal Swab  Result Value Ref Range Status   SARS Coronavirus 2 by RT PCR NEGATIVE NEGATIVE Final    Comment: (NOTE) SARS-CoV-2 target nucleic acids are NOT DETECTED.  The SARS-CoV-2 RNA is generally detectable in upper respiratory specimens during the acute phase of infection. The lowest concentration of SARS-CoV-2 viral copies this assay can detect is 138 copies/mL. A negative result does not preclude SARS-Cov-2 infection and should not be used as the sole basis for treatment or other patient management decisions. A negative result may occur with  improper  specimen collection/handling, submission of specimen other than nasopharyngeal swab, presence of viral mutation(s) within the areas targeted by this assay, and inadequate number of viral copies(<138  copies/mL). A negative result must be combined with clinical observations, patient history, and epidemiological information. The expected result is Negative.  Fact Sheet for Patients:  BloggerCourse.com  Fact Sheet for Healthcare Providers:  SeriousBroker.it  This test is no t yet approved or cleared by the [place]  FDA and  has been authorized for detection and/or diagnosis of SARS-CoV-2 by FDA under an Emergency Use Authorization (EUA). This EUA will remain  in effect (meaning this test can be used) for the duration of the COVID-19 declaration under Section 564(b)(1) of the Act, 21 U.S.C.section 360bbb-3(b)(1), unless the authorization is terminated  or revoked sooner.       Influenza A by PCR NEGATIVE NEGATIVE Final   Influenza B by PCR NEGATIVE NEGATIVE Final    Comment: (NOTE) The Xpert Xpress SARS-CoV-2/FLU/RSV plus assay is intended as an aid in the diagnosis of influenza from Nasopharyngeal swab specimens and should not be used as a sole basis for treatment. Nasal washings and aspirates are unacceptable for Xpert Xpress SARS-CoV-2/FLU/RSV testing.  Fact Sheet for Patients: BloggerCourse.com  Fact Sheet for Healthcare Providers: SeriousBroker.it  This test is not yet approved or cleared by the [place]  FDA and has been authorized for detection and/or diagnosis of SARS-CoV-2 by FDA under an Emergency Use Authorization (EUA). This EUA will remain in effect (meaning this test can be used) for the duration of the COVID-19 declaration under Section 564(b)(1) of the Act, 21 U.S.C. section 360bbb-3(b)(1), unless the authorization is terminated or revoked.     Resp Syncytial Virus by PCR NEGATIVE NEGATIVE Final    Comment: (NOTE) Fact Sheet for Patients: BloggerCourse.com  Fact Sheet for Healthcare  Providers: SeriousBroker.it  This test is not yet approved or cleared by the [place]  FDA and has been authorized for detection and/or diagnosis of SARS-CoV-2 by FDA under an Emergency Use Authorization (EUA). This EUA will remain in effect (meaning this test can be used) for the duration of the COVID-19 declaration under Section 564(b)(1) of the Act, 21 U.S.C. section 360bbb-3(b)(1), unless the authorization is terminated or revoked.  Performed at [facility], [address] [place], [place] [address]   Blood Culture (routine x 2)     Status: None   Collection Time: 09/07/23 10:29 PM   Specimen: BLOOD  Result Value Ref Range Status   Specimen Description BLOOD LEFT FA  Final   Special Requests   Final    BOTTLES DRAWN AEROBIC AND ANAEROBIC Blood Culture results may not be optimal due to an inadequate volume of blood received in culture bottles   Culture   Final    NO GROWTH 5 DAYS Performed at [facility], [address] [place], [place] [address]    Report Status 09/12/2023 FINAL  Final  Blood Culture (routine x 2)     Status: None   Collection Time: 09/07/23 10:29 PM   Specimen: BLOOD  Result Value Ref Range Status   Specimen Description BLOOD LEFT HAND  Final   Special Requests   Final    BOTTLES DRAWN AEROBIC AND ANAEROBIC Blood Culture results may not be optimal due to an inadequate volume of blood received in culture bottles   Culture   Final    NO GROWTH 5 DAYS Performed at [facility], [address] [place], [place] [address]  Report Status 09/12/2023 FINAL  Final  Aerobic Culture w Gram Stain (superficial specimen)     Status: None   Collection Time: 09/08/23 10:25 AM   Specimen: Abscess  Result Value Ref Range Status   Specimen Description   Final    ABSCESS Performed at [facility], [address] [place], [place] [address]    Special Requests   Final    NONE Performed at  [facility], [address] [place], [place] [address]    Gram Stain   Final    FEW WBC PRESENT,BOTH PMN AND MONONUCLEAR FEW GRAM POSITIVE COCCI IN CHAINS FEW GRAM POSITIVE COCCI IN PAIRS FEW GRAM NEGATIVE RODS    Culture   Final    FEW ESCHERICHIA COLI FEW MORGANELLA MORGANII RARE ENTEROCOCCUS FAECALIS FEW STREPTOCOCCUS AGALACTIAE TESTING AGAINST S. AGALACTIAE NOT ROUTINELY PERFORMED DUE TO PREDICTABILITY OF AMP/PEN/VAN SUSCEPTIBILITY. Performed at [facility], [address], [place], [place] [address]    Report Status 09/12/2023 FINAL  Final   Organism ID, Bacteria ESCHERICHIA COLI  Final   Organism ID, Bacteria MORGANELLA MORGANII  Final   Organism ID, Bacteria ENTEROCOCCUS FAECALIS  Final      Susceptibility   Escherichia coli - MIC*    AMPICILLIN >=32 RESISTANT Resistant     CEFEPIME  <=0.12 SENSITIVE Sensitive     CEFTAZIDIME <=1 SENSITIVE Sensitive     CEFTRIAXONE  <=0.25 SENSITIVE Sensitive     CIPROFLOXACIN  <=0.25 SENSITIVE Sensitive     GENTAMICIN <=1 SENSITIVE Sensitive     IMIPENEM <=0.25 SENSITIVE Sensitive     TRIMETH/SULFA <=20 SENSITIVE Sensitive     AMPICILLIN/SULBACTAM >=32 RESISTANT Resistant     PIP/TAZO <=4 SENSITIVE Sensitive ug/mL    * FEW ESCHERICHIA COLI   Enterococcus faecalis - MIC*    AMPICILLIN <=2 SENSITIVE Sensitive     VANCOMYCIN  2 SENSITIVE Sensitive     GENTAMICIN SYNERGY SENSITIVE Sensitive     * RARE ENTEROCOCCUS FAECALIS   Morganella morganii - MIC*    AMPICILLIN >=32 RESISTANT Resistant     CEFTAZIDIME <=1 SENSITIVE Sensitive     CIPROFLOXACIN  <=0.25 SENSITIVE Sensitive     GENTAMICIN <=1 SENSITIVE Sensitive     IMIPENEM 2 SENSITIVE Sensitive     TRIMETH/SULFA <=20 SENSITIVE Sensitive     AMPICILLIN/SULBACTAM 16 INTERMEDIATE Intermediate     PIP/TAZO <=4 SENSITIVE Sensitive ug/mL    * FEW MORGANELLA MORGANII    Procedures and diagnostic studies:  No results found.             LOS: 9 days    Jezabelle Chisolm  [employer] loss adjuster on [email or web address]. If 7PM-7AM, please contact night-coverage at [email or web address]     09/17/2023, 11:06 AM

## 2023-09-17 NOTE — Plan of Care (Signed)
  Problem: Clinical Measurements: Goal: Ability to maintain clinical measurements within normal limits will improve Outcome: Progressing   Problem: Clinical Measurements: Goal: Diagnostic test results will improve Outcome: Progressing   Problem: Clinical Measurements: Goal: Respiratory complications will improve Outcome: Progressing   Problem: Clinical Measurements: Goal: Cardiovascular complication will be avoided Outcome: Progressing   Problem: Nutrition: Goal: Adequate nutrition will be maintained Outcome: Progressing

## 2023-09-18 DIAGNOSIS — L8993 Pressure ulcer of unspecified site, stage 3: Secondary | ICD-10-CM | POA: Diagnosis not present

## 2023-09-18 DIAGNOSIS — L089 Local infection of the skin and subcutaneous tissue, unspecified: Secondary | ICD-10-CM | POA: Diagnosis not present

## 2023-09-18 MED ORDER — SACCHAROMYCES BOULARDII 250 MG PO CAPS
250.0000 mg | ORAL_CAPSULE | Freq: Two times a day (BID) | ORAL | Status: DC
  Administered 2023-09-18 – 2023-09-30 (×25): 250 mg via ORAL
  Filled 2023-09-18 (×26): qty 1

## 2023-09-18 MED ORDER — AMOXICILLIN-POT CLAVULANATE 500-125 MG PO TABS
1.0000 | ORAL_TABLET | Freq: Two times a day (BID) | ORAL | Status: AC
  Administered 2023-09-18 – 2023-09-27 (×20): 1 via ORAL
  Filled 2023-09-18 (×20): qty 1

## 2023-09-18 MED ORDER — CIPROFLOXACIN HCL 500 MG PO TABS
500.0000 mg | ORAL_TABLET | Freq: Two times a day (BID) | ORAL | Status: AC
  Administered 2023-09-18 – 2023-09-27 (×20): 500 mg via ORAL
  Filled 2023-09-18 (×20): qty 1

## 2023-09-18 NOTE — Progress Notes (Signed)
Progress Note    Danielle [name]  YQI:[id number] DOB: 07-11-1929  DOA: 09/07/2023 PCP: Hillery Aldo, MD      Brief Narrative:    Medical records reviewed and are as summarized below:  Danielle [name] is a 89 y.o. female with medical history significant for HTN, CAD, hypothyroidism, depression, COPD, OSA on CPAP, osteoarthritis, nonambulant at baseline and total care dependent with known left ischial and sacral decubitus ulcers, managed by home health nurse.  She was brought to the hospital because of general weakness of bowel 1 week duration and change in mental status ("talking out of her head") of about 2 days duration      Assessment/Plan:   Principal Problem:   Infected decubitus ulcer Active Problems:   Acute metabolic encephalopathy   AKI (acute kidney injury) (HCC)   Chronic anemia   Osteoarthritis of knees, bilateral   Bedbound   Advanced age   Coronary arteriosclerosis in native artery   Hypertension   Chronic obstructive pulmonary disease (HCC)   Hypothyroidism, unspecified   Depression   OSA on CPAP   Functional quadriplegia (HCC)   Sacral wound, initial encounter   Protein-calorie malnutrition, severe   Nutrition Problem: Severe Malnutrition Etiology: chronic illness  Signs/Symptoms: severe fat depletion, severe muscle depletion   Body mass index is 41.63 kg/m.  (Morbid obesity)   Infected decubitus ulcer, buttock abscess Wound culture showed multiple organisms E. coli, Morganella, Enterococcus faecalis and Streptococcus agalactiae.   Discontinue IV Zosyn and start Augmentin and ciprofloxacin and treat for 10 additional days.  She tolerated amoxicillin challenge. S/p I&D of left buttock abscess on 09/12/2023. General surgery recommended more extensive debridement of infected decubitus ulcers but family declined.  Family wants her to go to SNF on palliative care.    Acute on Chronic anemia Anemia of chronic disease, suspect chronic blood  loss from wound Iron 24, ferritin high, folate normal, B12 high (2,049) H&H 6.7 09/14/23 got 1 unit of blood transfusion. H&H is stable.  Ferritin was 732.  Discontinue iron supplement.   AKI (acute kidney injury) (HCC) Dehydration Improved Avoid nephrotoxins.   Encourage oral fluids, supplements.    Acute metabolic encephalopathy-improved Continue delirium precaution. Continue Aricept.    Hypertension: BP stable. Continue amlodipine   Hypokalemia Improved   Osteoarthritis of knees, bilateral Bedbound status secondary to osteoarthritis Advanced age and frailty/functional quadriplegia Patient is total care dependent Patient had been receiving knee injections due to chronic pain Analgesics as needed. PT OT advised long-term care. Family wishes her to be in local facility, [facility] working on placement.    Comorbidities include COPD, OSA on CPAP, depression, hypothyroidism   Medically stable for discharge.  Awaiting placement to long-term care facility.    Diet Order             DIET DYS 3 Room service appropriate? Yes with Assist; Fluid consistency: Thin  Diet effective now                            Consultants: General surgeon Palliative care  Procedures: I&D of left posterior thigh abscess    Medications:    amLODipine  5 mg Oral Daily   amoxicillin-clavulanate  1 tablet Oral BID   vitamin C  500 mg Oral BID   ciprofloxacin  500 mg Oral BID   diclofenac Sodium  2 g Topical QID   donepezil  5 mg Oral QHS   enoxaparin (LOVENOX)  injection  40 mg Subcutaneous Q24H   feeding supplement  237 mL Oral TID BM   ferrous sulfate  325 mg Oral Q breakfast   levothyroxine  150 mcg Oral Q0600   mirabegron ER  25 mg Oral Daily   multivitamin with minerals  1 tablet Oral Daily   saccharomyces boulardii  250 mg Oral BID   senna  1 tablet Oral Daily   sertraline  50 mg Oral Daily   Continuous Infusions:     Anti-infectives (From admission,  onward)    Start     Dose/Rate Route Frequency Ordered Stop   09/18/23 1200  ciprofloxacin (CIPRO) tablet 500 mg        500 mg Oral 2 times daily 09/18/23 0738 09/28/23 0959   09/18/23 1200  amoxicillin-clavulanate (AUGMENTIN) 500-125 MG per tablet 1 tablet        1 tablet Oral 2 times daily 09/18/23 0738 09/28/23 0959   09/12/23 2200  piperacillin-tazobactam (ZOSYN) IVPB 3.375 g  Status:  Discontinued        3.375 g 12.5 mL/hr over 240 Minutes Intravenous Every 8 hours 09/12/23 1544 09/18/23 0738   09/12/23 1400  amoxicillin (AMOXIL) capsule 500 mg        500 mg Oral  Once 09/12/23 1256 09/12/23 1436   09/11/23 1800  ceFEPIme (MAXIPIME) 2 g in sodium chloride 0.9 % 100 mL IVPB  Status:  Discontinued        2 g 200 mL/hr over 30 Minutes Intravenous Every 12 hours 09/11/23 1553 09/12/23 1544   09/09/23 0900  vancomycin (VANCOREADY) IVPB 750 mg/150 mL  Status:  Discontinued        750 mg 150 mL/hr over 60 Minutes Intravenous Every 24 hours 09/09/23 0800 09/12/23 1544   09/08/23 2300  cefTRIAXone (ROCEPHIN) 1 g in sodium chloride 0.9 % 100 mL IVPB  Status:  Discontinued        1 g 200 mL/hr over 30 Minutes Intravenous Every 24 hours 09/08/23 0231 09/11/23 1553   09/08/23 0300  vancomycin variable dose per unstable renal function (pharmacist dosing)  Status:  Discontinued         Does not apply See admin instructions 09/08/23 0300 09/09/23 0800   09/08/23 0300  vancomycin (VANCOREADY) IVPB 500 mg/100 mL        500 mg 100 mL/hr over 60 Minutes Intravenous  Once 09/08/23 0257 09/08/23 0448   09/07/23 2245  cefTRIAXone (ROCEPHIN) 2 g in sodium chloride 0.9 % 100 mL IVPB        2 g 200 mL/hr over 30 Minutes Intravenous Once 09/07/23 2240 09/07/23 2331   09/07/23 2245  vancomycin (VANCOCIN) IVPB 1000 mg/200 mL premix        1,000 mg 200 mL/hr over 60 Minutes Intravenous  Once 09/07/23 2240 09/08/23 0044              Family Communication/Anticipated D/C date and plan/Code Status    DVT prophylaxis: enoxaparin (LOVENOX) injection 40 mg Start: 09/10/23 0800     Code Status: Limited: Do not attempt resuscitation (DNR) -DNR-LIMITED -Do Not Intubate/DNI   Family Communication: None Disposition Plan: Plan to discharge to long-term care facility   Status is: Inpatient Remains inpatient appropriate because: Awaiting placement       Subjective:   Interval events noted.  She has no complaints.  She feels better.  Objective:    Vitals:   09/17/23 2032 09/18/23 0425 09/18/23 0427 09/18/23 0815  BP: (!) 125/46  Marland Kitchen)  143/58 (!) 116/43  Pulse: (!) 52  (!) 55 (!) 53  Resp: 18  18 18   Temp: 98.4 F (36.9 C)  98 F (36.7 C) 98 F (36.7 C)  TempSrc: Oral  Oral   SpO2: 99%  100% 95%  Weight:  106.6 kg    Height:       No data found.   Intake/Output Summary (Last 24 hours) at 09/18/2023 1254 Last data filed at 09/17/2023 2050 Gross per 24 hour  Intake --  Output 650 ml  Net -650 ml   Filed Weights   09/16/23 0500 09/17/23 0500 09/18/23 0425  Weight: 108.9 kg 105.7 kg 106.6 kg    Exam:  GEN: NAD, sitting up in the chair SKIN: Warm and dry EYES: No pallor or icterus ENT: MMM CV: RRR PULM: CTA B ABD: soft, ND, NT, +BS CNS: AAO x 3, non focal EXT: No edema or tenderness          Data Reviewed:   I have personally reviewed following labs and imaging studies:  Labs: Labs show the following:   Basic Metabolic Panel: Recent Labs  Lab 09/12/23 0454  NA 135  K 3.8  CL 108  CO2 21*  GLUCOSE 90  BUN 24*  CREATININE 0.90  CALCIUM 8.5*  MG 1.8  PHOS 2.7   GFR Estimated Creatinine Clearance: 46.7 mL/min (by C-G formula based on SCr of 0.9 mg/dL). Liver Function Tests: No results for input(s): "AST", "ALT", "ALKPHOS", "BILITOT", "PROT", "ALBUMIN" in the last 168 hours. No results for input(s): "LIPASE", "AMYLASE" in the last 168 hours. No results for input(s): "AMMONIA" in the last 168 hours. Coagulation profile No results for  input(s): "INR", "PROTIME" in the last 168 hours.  CBC: Recent Labs  Lab 09/12/23 0454 09/13/23 1416 09/14/23 1248 09/15/23 0516 09/17/23 0529  WBC 5.9  --   --  5.3  --   HGB 7.5* 7.9* 6.7* 9.4* 9.0*  HCT 22.1* 23.7* 19.7* 28.3* 27.9*  MCV 90.6  --   --  91.9  --   PLT 171  --   --  164  --    Cardiac Enzymes: No results for input(s): "CKTOTAL", "CKMB", "CKMBINDEX", "TROPONINI" in the last 168 hours. BNP (last 3 results) No results for input(s): "PROBNP" in the last 8760 hours. CBG: No results for input(s): "GLUCAP" in the last 168 hours. D-Dimer: No results for input(s): "DDIMER" in the last 72 hours. Hgb A1c: No results for input(s): "HGBA1C" in the last 72 hours. Lipid Profile: No results for input(s): "CHOL", "HDL", "LDLCALC", "TRIG", "CHOLHDL", "LDLDIRECT" in the last 72 hours. Thyroid function studies: No results for input(s): "TSH", "T4TOTAL", "T3FREE", "THYROIDAB" in the last 72 hours.  Invalid input(s): "FREET3" Anemia work up: No results for input(s): "VITAMINB12", "FOLATE", "FERRITIN", "TIBC", "IRON", "RETICCTPCT" in the last 72 hours. Sepsis Labs: Recent Labs  Lab 09/12/23 0454 09/15/23 0516  WBC 5.9 5.3    Microbiology No results found for this or any previous visit (from the past 240 hours).   Procedures and diagnostic studies:  No results found.             LOS: 10 days   Damarkus Balis  [facility]   Pager on [email or web address]. If 7PM-7AM, please contact night-coverage at [email or web address]     09/18/2023, 12:54 PM

## 2023-09-18 NOTE — Progress Notes (Signed)
Mobility Specialist - Progress Note   09/18/23 1134  Mobility  Activity Transferred from chair to bed  Level of Assistance +2 (takes two people)  Assistive Device  Secondary school teacher)  Activity Response Tolerated well  Mobility visit 1 Mobility  Mobility Specialist Start Time (ACUTE ONLY) 1103  Mobility Specialist Stop Time (ACUTE ONLY) 1118  Mobility Specialist Time Calculation (min) (ACUTE ONLY) 15 min   Pt supine upon entry, utilizing RA. Pt agreeable to transfer to the recliner this date, reporting 7/10 pain in stomach. Pt required MaxA to roll L and R while MS and NT place sling. Pt transferred to the chair via [facility] +2. Pt left seated in the recliner with alarm set and needs within reach. RN notified.   Zetta Bills Mobility Specialist 09/18/23 11:53 AM

## 2023-09-19 DIAGNOSIS — L089 Local infection of the skin and subcutaneous tissue, unspecified: Secondary | ICD-10-CM | POA: Diagnosis not present

## 2023-09-19 DIAGNOSIS — L8993 Pressure ulcer of unspecified site, stage 3: Secondary | ICD-10-CM | POA: Diagnosis not present

## 2023-09-19 LAB — BPAM RBC
Blood Product Expiration Date: 202501312359
Blood Product Expiration Date: 202502132359
Blood Product Expiration Date: 202502182359
Blood Product Expiration Date: 202502202359
Blood Product Expiration Date: 202502202359
Blood Product Expiration Date: 202502202359
ISSUE DATE / TIME: 202501130028
ISSUE DATE / TIME: 202501151402
ISSUE DATE / TIME: 202501151651
Unit Type and Rh: 5100
Unit Type and Rh: 5100
Unit Type and Rh: 5100
Unit Type and Rh: 5100
Unit Type and Rh: 5100
Unit Type and Rh: 5100

## 2023-09-19 LAB — TYPE AND SCREEN
ABO/RH(D): O POS
Antibody Screen: POSITIVE
DAT, IgG: POSITIVE
DAT, complement: POSITIVE
Unit division: 0
Unit division: 0
Unit division: 0
Unit division: 0
Unit division: 0
Unit division: 0

## 2023-09-19 NOTE — Plan of Care (Signed)

## 2023-09-19 NOTE — TOC Progression Note (Signed)
Transition of Care [facility]) - Progression Note    Patient Details  Name: Kinslea R [place] MRN: [id number] Date of Birth: 1929/07/11  Transition of Care [facility] CM/SW Contact  Chapman Fitch, RN Phone Number: 09/19/2023, 4:46 PM  Clinical Narrative:      Daughter and Cheryln Manly are still working on arrangements for her medicaid/payment for LTC      Expected Discharge Plan and Services                                               Social Determinants of Health (SDOH) Interventions SDOH Screenings   Food Insecurity: No Food Insecurity (09/09/2023)  Housing: Low Risk  (09/09/2023)  Transportation Needs: No Transportation Needs (09/09/2023)  Utilities: Not At Risk (09/09/2023)  Alcohol Screen: Low Risk  (10/06/2018)  Depression (PHQ2-9): Low Risk  (10/06/2018)  Social Connections: Socially Isolated (09/09/2023)  Tobacco Use: Low Risk  (09/09/2023)    Readmission Risk Interventions     No data to display

## 2023-09-19 NOTE — Progress Notes (Signed)
Progress Note    Danielle [name]  WUJ:[id number] DOB: 1929-03-15  DOA: 09/07/2023 PCP: Hillery Aldo, MD      Brief Narrative:    Medical records reviewed and are as summarized below:  Danielle [name] is a 89 y.o. female with medical history significant for HTN, CAD, hypothyroidism, depression, COPD, OSA on CPAP, osteoarthritis, nonambulant at baseline and total care dependent with known left ischial and sacral decubitus ulcers, managed by home health nurse.  She was brought to the hospital because of general weakness of bowel 1 week duration and change in mental status ("talking out of her head") of about 2 days duration      Assessment/Plan:   Principal Problem:   Infected decubitus ulcer Active Problems:   Acute metabolic encephalopathy   AKI (acute kidney injury) (HCC)   Chronic anemia   Osteoarthritis of knees, bilateral   Bedbound   Advanced age   Coronary arteriosclerosis in native artery   Hypertension   Chronic obstructive pulmonary disease (HCC)   Hypothyroidism, unspecified   Depression   OSA on CPAP   Functional quadriplegia (HCC)   Sacral wound, initial encounter   Protein-calorie malnutrition, severe   Nutrition Problem: Severe Malnutrition Etiology: chronic illness  Signs/Symptoms: severe fat depletion, severe muscle depletion   Body mass index is 41.63 kg/m.  (Morbid obesity)   Infected decubitus ulcer, buttock abscess Wound culture showed multiple organisms E. coli, Morganella, Enterococcus faecalis and Streptococcus agalactiae.  Previously treated with IV antibiotics including ceftriaxone, vancomycin and Zosyn. Started on Augmentin and ciprofloxacin on 09/18/2023.  Plan to continue oral antibiotics through 09/27/2023.   S/p I&D of left buttock abscess on 09/12/2023. General surgery recommended more extensive debridement of infected decubitus ulcers but family declined.  Family wants her to go to SNF on palliative care.    Acute on  Chronic anemia Anemia of chronic disease, suspect chronic blood loss from wound Iron 24, ferritin 732, folate normal, B12 high (2,049) H&H 6.7 09/14/23 got 1 unit of blood transfusion. H&H is stable.    AKI (acute kidney injury) (HCC) Dehydration Improved Avoid nephrotoxins.   Encourage oral fluids, supplements.    Acute metabolic encephalopathy-improved Continue delirium precaution. Continue Aricept.    Hypertension: BP stable. Continue amlodipine   Hypokalemia Improved   Osteoarthritis of knees, bilateral Bedbound status secondary to osteoarthritis Advanced age and frailty/functional quadriplegia Patient is total care dependent Patient had been receiving knee injections due to chronic pain Analgesics as needed. PT OT advised long-term care. Family wishes her to be in local facility, [facility] working on placement.    Comorbidities include COPD, OSA on CPAP, depression, hypothyroidism   Medically stable for discharge.  Awaiting placement to long-term care facility.  Plan of care was discussed with Bebe Liter, grandson, at the bedside.  He had a lot of questions which were answered to the best of my ability.  He understands that without more extensive debridement of the gluteal abscess, antibiotics alone may fail to adequately treat the infection.  However, he said that because of advanced age and risk of complications from anesthesia, they do not want patient to have any extensive debridement at this time.    Diet Order             DIET DYS 3 Room service appropriate? Yes with Assist; Fluid consistency: Thin  Diet effective now  Consultants: General surgeon Palliative care  Procedures: I&D of left posterior thigh abscess    Medications:    amLODipine  5 mg Oral Daily   amoxicillin-clavulanate  1 tablet Oral BID   vitamin C  500 mg Oral BID   ciprofloxacin  500 mg Oral BID   diclofenac Sodium  2 g Topical QID    donepezil  5 mg Oral QHS   enoxaparin (LOVENOX) injection  40 mg Subcutaneous Q24H   feeding supplement  237 mL Oral TID BM   levothyroxine  150 mcg Oral Q0600   mirabegron ER  25 mg Oral Daily   multivitamin with minerals  1 tablet Oral Daily   saccharomyces boulardii  250 mg Oral BID   senna  1 tablet Oral Daily   sertraline  50 mg Oral Daily   Continuous Infusions:     Anti-infectives (From admission, onward)    Start     Dose/Rate Route Frequency Ordered Stop   09/18/23 1200  ciprofloxacin (CIPRO) tablet 500 mg        500 mg Oral 2 times daily 09/18/23 0738 09/28/23 0959   09/18/23 1200  amoxicillin-clavulanate (AUGMENTIN) 500-125 MG per tablet 1 tablet        1 tablet Oral 2 times daily 09/18/23 0738 09/28/23 0959   09/12/23 2200  piperacillin-tazobactam (ZOSYN) IVPB 3.375 g  Status:  Discontinued        3.375 g 12.5 mL/hr over 240 Minutes Intravenous Every 8 hours 09/12/23 1544 09/18/23 0738   09/12/23 1400  amoxicillin (AMOXIL) capsule 500 mg        500 mg Oral  Once 09/12/23 1256 09/12/23 1436   09/11/23 1800  ceFEPIme (MAXIPIME) 2 g in sodium chloride 0.9 % 100 mL IVPB  Status:  Discontinued        2 g 200 mL/hr over 30 Minutes Intravenous Every 12 hours 09/11/23 1553 09/12/23 1544   09/09/23 0900  vancomycin (VANCOREADY) IVPB 750 mg/150 mL  Status:  Discontinued        750 mg 150 mL/hr over 60 Minutes Intravenous Every 24 hours 09/09/23 0800 09/12/23 1544   09/08/23 2300  cefTRIAXone (ROCEPHIN) 1 g in sodium chloride 0.9 % 100 mL IVPB  Status:  Discontinued        1 g 200 mL/hr over 30 Minutes Intravenous Every 24 hours 09/08/23 0231 09/11/23 1553   09/08/23 0300  vancomycin variable dose per unstable renal function (pharmacist dosing)  Status:  Discontinued         Does not apply See admin instructions 09/08/23 0300 09/09/23 0800   09/08/23 0300  vancomycin (VANCOREADY) IVPB 500 mg/100 mL        500 mg 100 mL/hr over 60 Minutes Intravenous  Once 09/08/23 0257  09/08/23 0448   09/07/23 2245  cefTRIAXone (ROCEPHIN) 2 g in sodium chloride 0.9 % 100 mL IVPB        2 g 200 mL/hr over 30 Minutes Intravenous Once 09/07/23 2240 09/07/23 2331   09/07/23 2245  vancomycin (VANCOCIN) IVPB 1000 mg/200 mL premix        1,000 mg 200 mL/hr over 60 Minutes Intravenous  Once 09/07/23 2240 09/08/23 0044              Family Communication/Anticipated D/C date and plan/Code Status   DVT prophylaxis: enoxaparin (LOVENOX) injection 40 mg Start: 09/10/23 0800     Code Status: Limited: Do not attempt resuscitation (DNR) -DNR-LIMITED -Do Not Intubate/DNI   Family Communication: Plan  discussed with Bebe Liter, grandson, at the bedside Disposition Plan: Plan to discharge to long-term care facility   Status is: Inpatient Remains inpatient appropriate because: Awaiting placement       Subjective:   No acute events overnight.  She has no complaints.  Jake Shark, son and Luther, grandson, were at the bedside.  Objective:    Vitals:   09/19/23 0340 09/19/23 0800 09/19/23 1000 09/19/23 1215  BP: (!) 153/48 (!) 121/47 (!) 119/50 (!) 125/44  Pulse: (!) 56 (!) 56 (!) 58 (!) 57  Resp: 16 18    Temp: 97.9 F (36.6 C) 98.6 F (37 C)  98.2 F (36.8 C)  TempSrc: Oral Oral    SpO2: 100% 100%  100%  Weight:      Height:       No data found.   Intake/Output Summary (Last 24 hours) at 09/19/2023 1239 Last data filed at 09/19/2023 1100 Gross per 24 hour  Intake 360 ml  Output 500 ml  Net -140 ml   Filed Weights   09/16/23 0500 09/17/23 0500 09/18/23 0425  Weight: 108.9 kg 105.7 kg 106.6 kg    Exam:   GEN: NAD SKIN: Warm and dry EYES: No pallor or icterus ENT: MMM CV: RRR PULM: CTA B ABD: soft, ND, NT, +BS CNS: AAO x 3, non focal EXT: No edema or tenderness        Data Reviewed:   I have personally reviewed following labs and imaging studies:  Labs: Labs show the following:   Basic Metabolic Panel: No results for input(s):  "NA", "K", "CL", "CO2", "GLUCOSE", "BUN", "CREATININE", "CALCIUM", "MG", "PHOS" in the last 168 hours.  GFR Estimated Creatinine Clearance: 46.7 mL/min (by C-G formula based on SCr of 0.9 mg/dL). Liver Function Tests: No results for input(s): "AST", "ALT", "ALKPHOS", "BILITOT", "PROT", "ALBUMIN" in the last 168 hours. No results for input(s): "LIPASE", "AMYLASE" in the last 168 hours. No results for input(s): "AMMONIA" in the last 168 hours. Coagulation profile No results for input(s): "INR", "PROTIME" in the last 168 hours.  CBC: Recent Labs  Lab 09/13/23 1416 09/14/23 1248 09/15/23 0516 09/17/23 0529  WBC  --   --  5.3  --   HGB 7.9* 6.7* 9.4* 9.0*  HCT 23.7* 19.7* 28.3* 27.9*  MCV  --   --  91.9  --   PLT  --   --  164  --    Cardiac Enzymes: No results for input(s): "CKTOTAL", "CKMB", "CKMBINDEX", "TROPONINI" in the last 168 hours. BNP (last 3 results) No results for input(s): "PROBNP" in the last 8760 hours. CBG: No results for input(s): "GLUCAP" in the last 168 hours. D-Dimer: No results for input(s): "DDIMER" in the last 72 hours. Hgb A1c: No results for input(s): "HGBA1C" in the last 72 hours. Lipid Profile: No results for input(s): "CHOL", "HDL", "LDLCALC", "TRIG", "CHOLHDL", "LDLDIRECT" in the last 72 hours. Thyroid function studies: No results for input(s): "TSH", "T4TOTAL", "T3FREE", "THYROIDAB" in the last 72 hours.  Invalid input(s): "FREET3" Anemia work up: No results for input(s): "VITAMINB12", "FOLATE", "FERRITIN", "TIBC", "IRON", "RETICCTPCT" in the last 72 hours. Sepsis Labs: Recent Labs  Lab 09/15/23 0516  WBC 5.3    Microbiology No results found for this or any previous visit (from the past 240 hours).   Procedures and diagnostic studies:  No results found.             LOS: 11 days   Jazleen Robeck  [facility] [employer] loss adjuster on [email or web address].  If 7PM-7AM, please contact night-coverage at [email or web address]     09/19/2023,  12:39 PM

## 2023-09-19 NOTE — Progress Notes (Signed)
Mobility Specialist - Progress Note   09/19/23 1118  Mobility  Activity Turned to right side;Turned to left side;Turned to back - supine;Transferred from bed to chair  Level of Assistance +2 (takes two people)  Education administrator  Water quality scientist)  Activity Response Tolerated well  Mobility visit 1 Mobility  Mobility Specialist Start Time (ACUTE ONLY) 1056  Mobility Specialist Stop Time (ACUTE ONLY) 1115  Mobility Specialist Time Calculation (min) (ACUTE ONLY) 19 min   Pt supine upon entry, utilizing RA. Pt agreeable to transfer to the chair this date. Pt rolled L and R while MS placed sling. Pt transferred to the recliner via hoyer lift +2, left seated in the recliner with alarm set and needs within reach. Pillow placed on the L side to address lateral lean while seated.  Zetta Bills Mobility Specialist 09/19/23 11:28 AM

## 2023-09-19 NOTE — Plan of Care (Signed)
PMT is currently shadowing, as currently goals are set for DNR/DNI and D/C to SNF.  Patient continues to work with mobility specialist, and TOC is working on options for placement.  PMT will continue to shadow for needs.  Please reach out for immediate needs.

## 2023-09-19 NOTE — Plan of Care (Signed)
  Problem: Activity: Goal: Risk for activity intolerance will decrease Outcome: Progressing   Problem: Nutrition: Goal: Adequate nutrition will be maintained Outcome: Progressing   Problem: Safety: Goal: Ability to remain free from injury will improve Outcome: Progressing   Problem: Skin Integrity: Goal: Risk for impaired skin integrity will decrease Outcome: Progressing   

## 2023-09-20 DIAGNOSIS — L089 Local infection of the skin and subcutaneous tissue, unspecified: Secondary | ICD-10-CM | POA: Diagnosis not present

## 2023-09-20 DIAGNOSIS — L8993 Pressure ulcer of unspecified site, stage 3: Secondary | ICD-10-CM | POA: Diagnosis not present

## 2023-09-20 NOTE — Progress Notes (Addendum)
Progress Note    Danielle [name]  WUX:[id number] DOB: Oct 28, 1928  DOA: 09/07/2023 PCP: Hillery Aldo, MD      Brief Narrative:    Medical records reviewed and are as summarized below:  Danielle [name] is a 89 y.o. female with medical history significant for HTN, CAD, hypothyroidism, depression, COPD, OSA on CPAP, osteoarthritis, nonambulant at baseline and total care dependent with known left ischial and sacral decubitus ulcers, managed by home health nurse.  She was brought to the hospital because of general weakness of bowel 1 week duration and change in mental status ("talking out of her head") of about 2 days duration      Assessment/Plan:   Principal Problem:   Infected decubitus ulcer Active Problems:   Acute metabolic encephalopathy   AKI (acute kidney injury) (HCC)   Chronic anemia   Osteoarthritis of knees, bilateral   Bedbound   Advanced age   Coronary arteriosclerosis in native artery   Hypertension   Chronic obstructive pulmonary disease (HCC)   Hypothyroidism, unspecified   Depression   OSA on CPAP   Functional quadriplegia (HCC)   Sacral wound, initial encounter   Protein-calorie malnutrition, severe   Nutrition Problem: Severe Malnutrition Etiology: chronic illness  Signs/Symptoms: severe fat depletion, severe muscle depletion   Body mass index is 43.35 kg/m.  (Morbid obesity)   Infected decubitus ulcer, buttock abscess Wound culture showed multiple organisms E. coli, Morganella, Enterococcus faecalis and Streptococcus agalactiae.  Previously treated with IV antibiotics including ceftriaxone, vancomycin and Zosyn. Started on Augmentin and ciprofloxacin on 09/18/2023.  Continue Augmentin and Cipro through 09/27/2023.   S/p I&D of left buttock abscess on 09/12/2023. General surgery recommended more extensive debridement of infected decubitus ulcers but family declined.  Family wants her to go to SNF on palliative care.    Acute on Chronic  anemia Anemia of chronic disease, suspect chronic blood loss from wound Iron 24, ferritin 732, folate normal, B12 high (2,049) H&H 6.7 09/14/23 got 1 unit of blood transfusion. H&H is stable.    AKI (acute kidney injury) (HCC) Dehydration Improved Avoid nephrotoxins.      Acute metabolic encephalopathy-improved She is back to her baseline mental status Continue Aricept.    Hypertension: Continue amlodipine   Hypokalemia Improved   Osteoarthritis of knees, bilateral Bedbound status secondary to osteoarthritis Advanced age and frailty/functional quadriplegia Patient is total care dependent Patient had been receiving knee injections due to chronic pain Analgesics as needed. PT OT advised long-term care. Family wishes her to be in local facility, [facility] working on placement.    Comorbidities include COPD, OSA on CPAP, depression, hypothyroidism   Medically stable for discharge.  Awaiting placement to long-term care facility.     Diet Order             DIET DYS 3 Room service appropriate? Yes with Assist; Fluid consistency: Thin  Diet effective now                            Consultants: General surgeon Palliative care  Procedures: I&D of left posterior thigh abscess    Medications:    amLODipine  5 mg Oral Daily   amoxicillin-clavulanate  1 tablet Oral BID   vitamin C  500 mg Oral BID   ciprofloxacin  500 mg Oral BID   diclofenac Sodium  2 g Topical QID   donepezil  5 mg Oral QHS   enoxaparin (LOVENOX) injection  40 mg Subcutaneous Q24H   feeding supplement  237 mL Oral TID BM   levothyroxine  150 mcg Oral Q0600   mirabegron ER  25 mg Oral Daily   multivitamin with minerals  1 tablet Oral Daily   saccharomyces boulardii  250 mg Oral BID   senna  1 tablet Oral Daily   sertraline  50 mg Oral Daily   Continuous Infusions:     Anti-infectives (From admission, onward)    Start     Dose/Rate Route Frequency Ordered Stop   09/18/23  1200  ciprofloxacin (CIPRO) tablet 500 mg        500 mg Oral 2 times daily 09/18/23 0738 09/28/23 0959   09/18/23 1200  amoxicillin-clavulanate (AUGMENTIN) 500-125 MG per tablet 1 tablet        1 tablet Oral 2 times daily 09/18/23 0738 09/28/23 0959   09/12/23 2200  piperacillin-tazobactam (ZOSYN) IVPB 3.375 g  Status:  Discontinued        3.375 g 12.5 mL/hr over 240 Minutes Intravenous Every 8 hours 09/12/23 1544 09/18/23 0738   09/12/23 1400  amoxicillin (AMOXIL) capsule 500 mg        500 mg Oral  Once 09/12/23 1256 09/12/23 1436   09/11/23 1800  ceFEPIme (MAXIPIME) 2 g in sodium chloride 0.9 % 100 mL IVPB  Status:  Discontinued        2 g 200 mL/hr over 30 Minutes Intravenous Every 12 hours 09/11/23 1553 09/12/23 1544   09/09/23 0900  vancomycin (VANCOREADY) IVPB 750 mg/150 mL  Status:  Discontinued        750 mg 150 mL/hr over 60 Minutes Intravenous Every 24 hours 09/09/23 0800 09/12/23 1544   09/08/23 2300  cefTRIAXone (ROCEPHIN) 1 g in sodium chloride 0.9 % 100 mL IVPB  Status:  Discontinued        1 g 200 mL/hr over 30 Minutes Intravenous Every 24 hours 09/08/23 0231 09/11/23 1553   09/08/23 0300  vancomycin variable dose per unstable renal function (pharmacist dosing)  Status:  Discontinued         Does not apply See admin instructions 09/08/23 0300 09/09/23 0800   09/08/23 0300  vancomycin (VANCOREADY) IVPB 500 mg/100 mL        500 mg 100 mL/hr over 60 Minutes Intravenous  Once 09/08/23 0257 09/08/23 0448   09/07/23 2245  cefTRIAXone (ROCEPHIN) 2 g in sodium chloride 0.9 % 100 mL IVPB        2 g 200 mL/hr over 30 Minutes Intravenous Once 09/07/23 2240 09/07/23 2331   09/07/23 2245  vancomycin (VANCOCIN) IVPB 1000 mg/200 mL premix        1,000 mg 200 mL/hr over 60 Minutes Intravenous  Once 09/07/23 2240 09/08/23 0044              Family Communication/Anticipated D/C date and plan/Code Status   DVT prophylaxis: enoxaparin (LOVENOX) injection 40 mg Start: 09/10/23  0800     Code Status: Limited: Do not attempt resuscitation (DNR) -DNR-LIMITED -Do Not Intubate/DNI   Family Communication: None Disposition Plan: Plan to discharge to long-term care facility   Status is: Inpatient Remains inpatient appropriate because: Awaiting placement       Subjective:   Interval events noted.  She has no complaints.  Objective:    Vitals:   09/19/23 1532 09/19/23 1950 09/20/23 0336 09/20/23 0746  BP: (!) 109/51 (!) 129/45 (!) 138/50 (!) 145/49  Pulse: 64 60 78 (!) 58  Resp:  18  18 20  Temp: 98 F (36.7 C) (!) 97.4 F (36.3 C) (!) 97.3 F (36.3 C) 98.4 F (36.9 C)  TempSrc: Oral Oral Oral   SpO2: 100% 97% 92% 100%  Weight:   111 kg   Height:       No data found.   Intake/Output Summary (Last 24 hours) at 09/20/2023 1400 Last data filed at 09/20/2023 0342 Gross per 24 hour  Intake --  Output 480 ml  Net -480 ml   Filed Weights   09/17/23 0500 09/18/23 0425 09/20/23 0336  Weight: 105.7 kg 106.6 kg 111 kg    Exam:   GEN: NAD SKIN: Warm and dry EYES: Anicteric ENT: MMM CV: RRR PULM: CTA B ABD: soft, ND, NT, +BS CNS: AAO x 3, non focal EXT: No edema or tenderness        Data Reviewed:   I have personally reviewed following labs and imaging studies:  Labs: Labs show the following:   Basic Metabolic Panel: No results for input(s): "NA", "K", "CL", "CO2", "GLUCOSE", "BUN", "CREATININE", "CALCIUM", "MG", "PHOS" in the last 168 hours.  GFR Estimated Creatinine Clearance: 47.7 mL/min (by C-G formula based on SCr of 0.9 mg/dL). Liver Function Tests: No results for input(s): "AST", "ALT", "ALKPHOS", "BILITOT", "PROT", "ALBUMIN" in the last 168 hours. No results for input(s): "LIPASE", "AMYLASE" in the last 168 hours. No results for input(s): "AMMONIA" in the last 168 hours. Coagulation profile No results for input(s): "INR", "PROTIME" in the last 168 hours.  CBC: Recent Labs  Lab 09/13/23 1416 09/14/23 1248  09/15/23 0516 09/17/23 0529  WBC  --   --  5.3  --   HGB 7.9* 6.7* 9.4* 9.0*  HCT 23.7* 19.7* 28.3* 27.9*  MCV  --   --  91.9  --   PLT  --   --  164  --    Cardiac Enzymes: No results for input(s): "CKTOTAL", "CKMB", "CKMBINDEX", "TROPONINI" in the last 168 hours. BNP (last 3 results) No results for input(s): "PROBNP" in the last 8760 hours. CBG: No results for input(s): "GLUCAP" in the last 168 hours. D-Dimer: No results for input(s): "DDIMER" in the last 72 hours. Hgb A1c: No results for input(s): "HGBA1C" in the last 72 hours. Lipid Profile: No results for input(s): "CHOL", "HDL", "LDLCALC", "TRIG", "CHOLHDL", "LDLDIRECT" in the last 72 hours. Thyroid function studies: No results for input(s): "TSH", "T4TOTAL", "T3FREE", "THYROIDAB" in the last 72 hours.  Invalid input(s): "FREET3" Anemia work up: No results for input(s): "VITAMINB12", "FOLATE", "FERRITIN", "TIBC", "IRON", "RETICCTPCT" in the last 72 hours. Sepsis Labs: Recent Labs  Lab 09/15/23 0516  WBC 5.3    Microbiology No results found for this or any previous visit (from the past 240 hours).   Procedures and diagnostic studies:  No results found.             LOS: 12 days   Laderrick Wilk  [employer] loss adjuster on [email or web address]. If 7PM-7AM, please contact night-coverage at [email or web address]     09/20/2023, 2:00 PM

## 2023-09-20 NOTE — Plan of Care (Signed)
  Problem: Pain Management: Goal: General experience of comfort will improve Outcome: Progressing   Problem: Safety: Goal: Ability to remain free from injury will improve Outcome: Progressing   Problem: Skin Integrity: Goal: Risk for impaired skin integrity will decrease Outcome: Progressing

## 2023-09-21 DIAGNOSIS — L8993 Pressure ulcer of unspecified site, stage 3: Secondary | ICD-10-CM | POA: Diagnosis not present

## 2023-09-21 DIAGNOSIS — L089 Local infection of the skin and subcutaneous tissue, unspecified: Secondary | ICD-10-CM | POA: Diagnosis not present

## 2023-09-21 NOTE — Progress Notes (Signed)
Progress Note    Danielle [name]  [id number] DOB: Jan 08, 1929  DOA: 09/07/2023 PCP: Hillery Aldo, MD      Brief Narrative:    Medical records reviewed and are as summarized below:  Danielle [name] is a 89 y.o. female with medical history significant for HTN, CAD, hypothyroidism, depression, COPD, OSA on CPAP, osteoarthritis, nonambulant at baseline and total care dependent with known left ischial and sacral decubitus ulcers, managed by home health nurse.  She was brought to the hospital because of general weakness of bowel 1 week duration and change in mental status ("talking out of her head") of about 2 days duration      Assessment/Plan:   Principal Problem:   Infected decubitus ulcer Active Problems:   Acute metabolic encephalopathy   AKI (acute kidney injury) (HCC)   Chronic anemia   Osteoarthritis of knees, bilateral   Bedbound   Advanced age   Coronary arteriosclerosis in native artery   Hypertension   Chronic obstructive pulmonary disease (HCC)   Hypothyroidism, unspecified   Depression   OSA on CPAP   Functional quadriplegia (HCC)   Sacral wound, initial encounter   Protein-calorie malnutrition, severe   Nutrition Problem: Severe Malnutrition Etiology: chronic illness  Signs/Symptoms: severe fat depletion, severe muscle depletion   Body mass index is 43.35 kg/m.  (Morbid obesity)   Infected decubitus ulcer, buttock abscess Wound culture showed multiple organisms E. coli, Morganella, Enterococcus faecalis and Streptococcus agalactiae.  Previously treated with IV antibiotics including ceftriaxone, vancomycin and Zosyn. Started on Augmentin and ciprofloxacin on 09/18/2023.  Continue ciprofloxacin and Augmentin until 09/27/2023 S/p I&D of left buttock abscess on 09/12/2023. General surgery recommended more extensive debridement of infected decubitus ulcers but family declined.  Awaiting placement to long-term care facility    Acute on Chronic  anemia Anemia of chronic disease, suspect chronic blood loss from wound Iron 24, ferritin 732, folate normal, B12 high (2,049) H&H 6.7 09/14/23 got 1 unit of blood transfusion. H&H is stable.    AKI (acute kidney injury) (HCC) Dehydration Improved Avoid nephrotoxins.      Acute metabolic encephalopathy-improved She is back to her baseline mental status Continue Aricept.    Hypertension: Continue amlodipine   Hypokalemia Improved   Osteoarthritis of knees, bilateral Bedbound status secondary to osteoarthritis Advanced age and frailty/functional quadriplegia Patient is total care dependent Patient had been receiving knee injections due to chronic pain Analgesics as needed. PT OT advised long-term care. Family wishes her to be in local facility, [facility] working on placement.    Comorbidities include COPD, OSA on CPAP, depression, hypothyroidism   Medically stable for discharge.  Awaiting placement to long-term care facility.     Diet Order             DIET DYS 3 Room service appropriate? Yes with Assist; Fluid consistency: Thin  Diet effective now                            Consultants: General surgeon Palliative care  Procedures: I&D of left posterior thigh abscess    Medications:    amLODipine  5 mg Oral Daily   amoxicillin-clavulanate  1 tablet Oral BID   vitamin C  500 mg Oral BID   ciprofloxacin  500 mg Oral BID   diclofenac Sodium  2 g Topical QID   donepezil  5 mg Oral QHS   enoxaparin (LOVENOX) injection  40 mg Subcutaneous Q24H  feeding supplement  237 mL Oral TID BM   levothyroxine  150 mcg Oral Q0600   mirabegron ER  25 mg Oral Daily   multivitamin with minerals  1 tablet Oral Daily   saccharomyces boulardii  250 mg Oral BID   senna  1 tablet Oral Daily   sertraline  50 mg Oral Daily   Continuous Infusions:     Anti-infectives (From admission, onward)    Start     Dose/Rate Route Frequency Ordered Stop   09/18/23  1200  ciprofloxacin (CIPRO) tablet 500 mg        500 mg Oral 2 times daily 09/18/23 0738 09/28/23 0959   09/18/23 1200  amoxicillin-clavulanate (AUGMENTIN) 500-125 MG per tablet 1 tablet        1 tablet Oral 2 times daily 09/18/23 0738 09/28/23 0959   09/12/23 2200  piperacillin-tazobactam (ZOSYN) IVPB 3.375 g  Status:  Discontinued        3.375 g 12.5 mL/hr over 240 Minutes Intravenous Every 8 hours 09/12/23 1544 09/18/23 0738   09/12/23 1400  amoxicillin (AMOXIL) capsule 500 mg        500 mg Oral  Once 09/12/23 1256 09/12/23 1436   09/11/23 1800  ceFEPIme (MAXIPIME) 2 g in sodium chloride 0.9 % 100 mL IVPB  Status:  Discontinued        2 g 200 mL/hr over 30 Minutes Intravenous Every 12 hours 09/11/23 1553 09/12/23 1544   09/09/23 0900  vancomycin (VANCOREADY) IVPB 750 mg/150 mL  Status:  Discontinued        750 mg 150 mL/hr over 60 Minutes Intravenous Every 24 hours 09/09/23 0800 09/12/23 1544   09/08/23 2300  cefTRIAXone (ROCEPHIN) 1 g in sodium chloride 0.9 % 100 mL IVPB  Status:  Discontinued        1 g 200 mL/hr over 30 Minutes Intravenous Every 24 hours 09/08/23 0231 09/11/23 1553   09/08/23 0300  vancomycin variable dose per unstable renal function (pharmacist dosing)  Status:  Discontinued         Does not apply See admin instructions 09/08/23 0300 09/09/23 0800   09/08/23 0300  vancomycin (VANCOREADY) IVPB 500 mg/100 mL        500 mg 100 mL/hr over 60 Minutes Intravenous  Once 09/08/23 0257 09/08/23 0448   09/07/23 2245  cefTRIAXone (ROCEPHIN) 2 g in sodium chloride 0.9 % 100 mL IVPB        2 g 200 mL/hr over 30 Minutes Intravenous Once 09/07/23 2240 09/07/23 2331   09/07/23 2245  vancomycin (VANCOCIN) IVPB 1000 mg/200 mL premix        1,000 mg 200 mL/hr over 60 Minutes Intravenous  Once 09/07/23 2240 09/08/23 0044              Family Communication/Anticipated D/C date and plan/Code Status   DVT prophylaxis: enoxaparin (LOVENOX) injection 40 mg Start: 09/10/23  0800     Code Status: Limited: Do not attempt resuscitation (DNR) -DNR-LIMITED -Do Not Intubate/DNI   Family Communication: None Disposition Plan: Plan to discharge to long-term care facility   Status is: Inpatient Remains inpatient appropriate because: Awaiting placement       Subjective:   No acute events overnight.  She has no complaints.  No pain.  Objective:    Vitals:   09/20/23 0746 09/20/23 1552 09/20/23 2018 09/21/23 0500  BP: (!) 145/49 (!) 140/52 (!) 145/52 (!) 143/50  Pulse: (!) 58 (!) 59 68 63  Resp: 20 18 19  19  Temp: 98.4 F (36.9 C) 97.7 F (36.5 C) 97.8 F (36.6 C) 97.6 F (36.4 C)  TempSrc:  Oral Oral Oral  SpO2: 100%  94% 100%  Weight:      Height:       No data found.   Intake/Output Summary (Last 24 hours) at 09/21/2023 1235 Last data filed at 09/21/2023 0300 Gross per 24 hour  Intake 237 ml  Output 450 ml  Net -213 ml   Filed Weights   09/17/23 0500 09/18/23 0425 09/20/23 0336  Weight: 105.7 kg 106.6 kg 111 kg    Exam:   GEN: NAD SKIN: Warm and dry EYES: Anicteric ENT: MMM CV: RRR PULM: CTA B ABD: soft, ND, NT, +BS CNS: AAO x 3, non focal EXT: No edema or tenderness       Data Reviewed:   I have personally reviewed following labs and imaging studies:  Labs: Labs show the following:   Basic Metabolic Panel: No results for input(s): "NA", "K", "CL", "CO2", "GLUCOSE", "BUN", "CREATININE", "CALCIUM", "MG", "PHOS" in the last 168 hours.  GFR Estimated Creatinine Clearance: 47.7 mL/min (by C-G formula based on SCr of 0.9 mg/dL). Liver Function Tests: No results for input(s): "AST", "ALT", "ALKPHOS", "BILITOT", "PROT", "ALBUMIN" in the last 168 hours. No results for input(s): "LIPASE", "AMYLASE" in the last 168 hours. No results for input(s): "AMMONIA" in the last 168 hours. Coagulation profile No results for input(s): "INR", "PROTIME" in the last 168 hours.  CBC: Recent Labs  Lab 09/14/23 1248 09/15/23 0516  09/17/23 0529  WBC  --  5.3  --   HGB 6.7* 9.4* 9.0*  HCT 19.7* 28.3* 27.9*  MCV  --  91.9  --   PLT  --  164  --    Cardiac Enzymes: No results for input(s): "CKTOTAL", "CKMB", "CKMBINDEX", "TROPONINI" in the last 168 hours. BNP (last 3 results) No results for input(s): "PROBNP" in the last 8760 hours. CBG: No results for input(s): "GLUCAP" in the last 168 hours. D-Dimer: No results for input(s): "DDIMER" in the last 72 hours. Hgb A1c: No results for input(s): "HGBA1C" in the last 72 hours. Lipid Profile: No results for input(s): "CHOL", "HDL", "LDLCALC", "TRIG", "CHOLHDL", "LDLDIRECT" in the last 72 hours. Thyroid function studies: No results for input(s): "TSH", "T4TOTAL", "T3FREE", "THYROIDAB" in the last 72 hours.  Invalid input(s): "FREET3" Anemia work up: No results for input(s): "VITAMINB12", "FOLATE", "FERRITIN", "TIBC", "IRON", "RETICCTPCT" in the last 72 hours. Sepsis Labs: Recent Labs  Lab 09/15/23 0516  WBC 5.3    Microbiology No results found for this or any previous visit (from the past 240 hours).   Procedures and diagnostic studies:  No results found.             LOS: 13 days   Asencion Loveday  [employer] loss adjuster on [email or web address]. If 7PM-7AM, please contact night-coverage at [email or web address]     09/21/2023, 12:35 PM

## 2023-09-21 NOTE — Plan of Care (Signed)
?  Problem: Clinical Measurements: ?Goal: Will remain free from infection ?Outcome: Progressing ?  ?

## 2023-09-22 DIAGNOSIS — L8993 Pressure ulcer of unspecified site, stage 3: Secondary | ICD-10-CM | POA: Diagnosis not present

## 2023-09-22 DIAGNOSIS — L089 Local infection of the skin and subcutaneous tissue, unspecified: Secondary | ICD-10-CM | POA: Diagnosis not present

## 2023-09-22 NOTE — Plan of Care (Signed)
  Problem: Nutrition: Goal: Adequate nutrition will be maintained Outcome: Progressing   

## 2023-09-22 NOTE — Progress Notes (Signed)
Progress Note    Danielle [name]  WJX:[id number] DOB: [birth year]  DOA: 09/07/2023 PCP: Hillery Aldo, MD      Brief Narrative:    Medical records reviewed and are as summarized below:  Danielle [name] is a 89 y.o. female with medical history significant for HTN, CAD, hypothyroidism, depression, COPD, OSA on CPAP, osteoarthritis, nonambulant at baseline and total care dependent with known left ischial and sacral decubitus ulcers, managed by home health nurse.  She was brought to the hospital because of general weakness of bowel 1 week duration and change in mental status ("talking out of her head") of about 2 days duration      Assessment/Plan:   Principal Problem:   Infected decubitus ulcer Active Problems:   Acute metabolic encephalopathy   AKI (acute kidney injury) (HCC)   Chronic anemia   Osteoarthritis of knees, bilateral   Bedbound   Advanced age   Coronary arteriosclerosis in native artery   Hypertension   Chronic obstructive pulmonary disease (HCC)   Hypothyroidism, unspecified   Depression   OSA on CPAP   Functional quadriplegia (HCC)   Sacral wound, initial encounter   Protein-calorie malnutrition, severe   Nutrition Problem: Severe Malnutrition Etiology: chronic illness  Signs/Symptoms: severe fat depletion, severe muscle depletion   Body mass index is 43.35 kg/m.  (Morbid obesity)   Infected decubitus ulcer, buttock abscess Wound culture showed multiple organisms E. coli, Morganella, Enterococcus faecalis and Streptococcus agalactiae.  Previously treated with IV antibiotics including ceftriaxone, vancomycin and Zosyn. Started on Augmentin and ciprofloxacin on 09/18/2023.  Continue Augmentin and ciprofloxacin through 09/27/2023 S/p I&D of left buttock abscess on 09/12/2023. General surgery recommended more extensive debridement of infected decubitus ulcers but family declined.  Check BMP tomorrow    Acute on Chronic anemia Anemia of chronic  disease, suspect chronic blood loss from wound Iron 24, ferritin 732, folate normal, B12 high (2,049) H&H 6.7 09/14/23 got 1 unit of blood transfusion. Repeat CBC tomorrow   AKI (acute kidney injury) (HCC) Dehydration Improved Avoid nephrotoxins.      Acute metabolic encephalopathy-improved She is back to her baseline mental status Continue Aricept.    Hypertension: BP is okay.  Continue amlodipine   Hypokalemia Improved   Osteoarthritis of knees, bilateral Bedbound status secondary to osteoarthritis Advanced age and frailty/functional quadriplegia Patient is total care dependent Patient had been receiving knee injections due to chronic pain Analgesics as needed. PT OT advised long-term care. Family wishes her to be in local facility, [facility] working on placement.    Comorbidities include COPD, OSA on CPAP, depression, hypothyroidism   Medically stable for discharge.  Awaiting placement to long-term care facility.     Diet Order             DIET DYS 3 Room service appropriate? Yes with Assist; Fluid consistency: Thin  Diet effective now                            Consultants: General surgeon Palliative care  Procedures: I&D of left posterior thigh abscess    Medications:    amLODipine  5 mg Oral Daily   amoxicillin-clavulanate  1 tablet Oral BID   vitamin C  500 mg Oral BID   ciprofloxacin  500 mg Oral BID   diclofenac Sodium  2 g Topical QID   donepezil  5 mg Oral QHS   enoxaparin (LOVENOX) injection  40 mg Subcutaneous Q24H  feeding supplement  237 mL Oral TID BM   levothyroxine  150 mcg Oral Q0600   mirabegron ER  25 mg Oral Daily   multivitamin with minerals  1 tablet Oral Daily   saccharomyces boulardii  250 mg Oral BID   senna  1 tablet Oral Daily   sertraline  50 mg Oral Daily   Continuous Infusions:     Anti-infectives (From admission, onward)    Start     Dose/Rate Route Frequency Ordered Stop   09/18/23 1200   ciprofloxacin (CIPRO) tablet 500 mg        500 mg Oral 2 times daily 09/18/23 0738 09/28/23 0959   09/18/23 1200  amoxicillin-clavulanate (AUGMENTIN) 500-125 MG per tablet 1 tablet        1 tablet Oral 2 times daily 09/18/23 0738 09/28/23 0959   09/12/23 2200  piperacillin-tazobactam (ZOSYN) IVPB 3.375 g  Status:  Discontinued        3.375 g 12.5 mL/hr over 240 Minutes Intravenous Every 8 hours 09/12/23 1544 09/18/23 0738   09/12/23 1400  amoxicillin (AMOXIL) capsule 500 mg        500 mg Oral  Once 09/12/23 1256 09/12/23 1436   09/11/23 1800  ceFEPIme (MAXIPIME) 2 g in sodium chloride 0.9 % 100 mL IVPB  Status:  Discontinued        2 g 200 mL/hr over 30 Minutes Intravenous Every 12 hours 09/11/23 1553 09/12/23 1544   09/09/23 0900  vancomycin (VANCOREADY) IVPB 750 mg/150 mL  Status:  Discontinued        750 mg 150 mL/hr over 60 Minutes Intravenous Every 24 hours 09/09/23 0800 09/12/23 1544   09/08/23 2300  cefTRIAXone (ROCEPHIN) 1 g in sodium chloride 0.9 % 100 mL IVPB  Status:  Discontinued        1 g 200 mL/hr over 30 Minutes Intravenous Every 24 hours 09/08/23 0231 09/11/23 1553   09/08/23 0300  vancomycin variable dose per unstable renal function (pharmacist dosing)  Status:  Discontinued         Does not apply See admin instructions 09/08/23 0300 09/09/23 0800   09/08/23 0300  vancomycin (VANCOREADY) IVPB 500 mg/100 mL        500 mg 100 mL/hr over 60 Minutes Intravenous  Once 09/08/23 0257 09/08/23 0448   09/07/23 2245  cefTRIAXone (ROCEPHIN) 2 g in sodium chloride 0.9 % 100 mL IVPB        2 g 200 mL/hr over 30 Minutes Intravenous Once 09/07/23 2240 09/07/23 2331   09/07/23 2245  vancomycin (VANCOCIN) IVPB 1000 mg/200 mL premix        1,000 mg 200 mL/hr over 60 Minutes Intravenous  Once 09/07/23 2240 09/08/23 0044              Family Communication/Anticipated D/C date and plan/Code Status   DVT prophylaxis: enoxaparin (LOVENOX) injection 40 mg Start: 09/10/23 0800      Code Status: Limited: Do not attempt resuscitation (DNR) -DNR-LIMITED -Do Not Intubate/DNI   Family Communication: Plan discussed with Jake Shark, son, at the bedside Disposition Plan: Plan to discharge to long-term care facility   Status is: Inpatient Remains inpatient appropriate because: Awaiting placement       Subjective:   Interval events noted.  She feels fine and has no complaints.  Jake Shark , son, was at the bedside  Objective:    Vitals:   09/21/23 2300 09/22/23 0434 09/22/23 0808 09/22/23 1546  BP: (!) 120/53 (!) 130/51 (!) 120/50 130/63  Pulse: 64 64 61 61  Resp: 16 16 16 16   Temp: 98.8 F (37.1 C) 97.6 F (36.4 C) 98 F (36.7 C) 98 F (36.7 C)  TempSrc:  Oral    SpO2: 97% 100% 100% 100%  Weight:      Height:       No data found.   Intake/Output Summary (Last 24 hours) at 09/22/2023 1716 Last data filed at 09/21/2023 2101 Gross per 24 hour  Intake --  Output 600 ml  Net -600 ml   Filed Weights   09/17/23 0500 09/18/23 0425 09/20/23 0336  Weight: 105.7 kg 106.6 kg 111 kg    Exam:  GEN: NAD SKIN: Warm and dry EYES: No pallor or icterus ENT: MMM CV: RRR PULM: CTA B ABD: soft, ND, NT, +BS CNS: AAO x 3, non focal EXT: No edema or tenderness      Data Reviewed:   I have personally reviewed following labs and imaging studies:  Labs: Labs show the following:   Basic Metabolic Panel: No results for input(s): "NA", "K", "CL", "CO2", "GLUCOSE", "BUN", "CREATININE", "CALCIUM", "MG", "PHOS" in the last 168 hours.  GFR Estimated Creatinine Clearance: 47.7 mL/min (by C-G formula based on SCr of 0.9 mg/dL). Liver Function Tests: No results for input(s): "AST", "ALT", "ALKPHOS", "BILITOT", "PROT", "ALBUMIN" in the last 168 hours. No results for input(s): "LIPASE", "AMYLASE" in the last 168 hours. No results for input(s): "AMMONIA" in the last 168 hours. Coagulation profile No results for input(s): "INR", "PROTIME" in the last 168  hours.  CBC: Recent Labs  Lab 09/17/23 0529  HGB 9.0*  HCT 27.9*   Cardiac Enzymes: No results for input(s): "CKTOTAL", "CKMB", "CKMBINDEX", "TROPONINI" in the last 168 hours. BNP (last 3 results) No results for input(s): "PROBNP" in the last 8760 hours. CBG: No results for input(s): "GLUCAP" in the last 168 hours. D-Dimer: No results for input(s): "DDIMER" in the last 72 hours. Hgb A1c: No results for input(s): "HGBA1C" in the last 72 hours. Lipid Profile: No results for input(s): "CHOL", "HDL", "LDLCALC", "TRIG", "CHOLHDL", "LDLDIRECT" in the last 72 hours. Thyroid function studies: No results for input(s): "TSH", "T4TOTAL", "T3FREE", "THYROIDAB" in the last 72 hours.  Invalid input(s): "FREET3" Anemia work up: No results for input(s): "VITAMINB12", "FOLATE", "FERRITIN", "TIBC", "IRON", "RETICCTPCT" in the last 72 hours. Sepsis Labs: No results for input(s): "PROCALCITON", "WBC", "LATICACIDVEN" in the last 168 hours.   Microbiology No results found for this or any previous visit (from the past 240 hours).   Procedures and diagnostic studies:  No results found.             LOS: 14 days   Tariyah Pendry  [employer] loss adjuster on [email or web address]. If 7PM-7AM, please contact night-coverage at [email or web address]     09/22/2023, 5:16 PM

## 2023-09-22 NOTE — Progress Notes (Signed)
Mobility Specialist - Progress Note   09/22/23 1103  Mobility  Activity Transferred from bed to chair  Level of Assistance +2 (takes two people)  Assistive Device  Water quality scientist)  Activity Response Tolerated well  Mobility visit 1 Mobility  Mobility Specialist Start Time (ACUTE ONLY) 1040  Mobility Specialist Stop Time (ACUTE ONLY) 1056  Mobility Specialist Time Calculation (min) (ACUTE ONLY) 16 min   Pt supine upon entry, utilizing RA. Pt agreeable to transfer to the recliner this date, expressing back and butt pain. Pt rolled L and R ModA while MS placed sling. Pt transferred to the recliner via [facility] lift +2 for safety. Pt left seated in the recliner with alarm set and needs within reach.   Zetta Bills Mobility Specialist 09/22/23 11:07 AM

## 2023-09-23 DIAGNOSIS — L089 Local infection of the skin and subcutaneous tissue, unspecified: Secondary | ICD-10-CM | POA: Diagnosis not present

## 2023-09-23 DIAGNOSIS — L8993 Pressure ulcer of unspecified site, stage 3: Secondary | ICD-10-CM | POA: Diagnosis not present

## 2023-09-23 LAB — CBC
HCT: 24.7 % — ABNORMAL LOW (ref 36.0–46.0)
Hemoglobin: 8 g/dL — ABNORMAL LOW (ref 12.0–15.0)
MCH: 30.9 pg (ref 26.0–34.0)
MCHC: 32.4 g/dL (ref 30.0–36.0)
MCV: 95.4 fL (ref 80.0–100.0)
Platelets: 251 10*3/uL (ref 150–400)
RBC: 2.59 MIL/uL — ABNORMAL LOW (ref 3.87–5.11)
RDW: 19.1 % — ABNORMAL HIGH (ref 11.5–15.5)
WBC: 4.1 10*3/uL (ref 4.0–10.5)
nRBC: 0 % (ref 0.0–0.2)

## 2023-09-23 LAB — BASIC METABOLIC PANEL
Anion gap: 7 (ref 5–15)
BUN: 30 mg/dL — ABNORMAL HIGH (ref 8–23)
CO2: 25 mmol/L (ref 22–32)
Calcium: 9.1 mg/dL (ref 8.9–10.3)
Chloride: 103 mmol/L (ref 98–111)
Creatinine, Ser: 0.87 mg/dL (ref 0.44–1.00)
GFR, Estimated: >60 mL/min (ref >60)
Glucose, Bld: 102 mg/dL — ABNORMAL HIGH (ref 70–99)
Potassium: 3.9 mmol/L (ref 3.5–5.1)
Sodium: 135 mmol/L (ref 135–145)

## 2023-09-23 LAB — MAGNESIUM: Magnesium: 2.2 mg/dL (ref 1.7–2.4)

## 2023-09-23 NOTE — Plan of Care (Signed)
PMT shadowing for needs. TOC is working on LTC placement with daughter. PMT will sign off at this time. Please reconsult if needs arise.

## 2023-09-23 NOTE — TOC Progression Note (Signed)
Transition of Care [facility]) - Progression Note    Patient Details  Name: Danielle [name] MRN: [id number] Date of Birth: 02/19/1929  Transition of [facility]) CM/SW Contact  Chapman Fitch, RN Phone Number: 09/23/2023, 3:31 PM  Clinical Narrative:      Per Stanton Kidney at [facility] is family would like to move forward with placement they would have to pay 1 month up front.  Stanton Kidney to follow up with daughter [name].  [name] is aware that if they are not able to work out and agreement with [facility] patient will have to return home      Expected Discharge Plan and Services                                               Social Determinants of Health (SDOH) Interventions SDOH Screenings   Food Insecurity: No Food Insecurity (09/09/2023)  Housing: Low Risk  (09/09/2023)  Transportation Needs: No Transportation Needs (09/09/2023)  Utilities: Not At Risk (09/09/2023)  Alcohol Screen: Low Risk  (10/06/2018)  Depression (PHQ2-9): Low Risk  (10/06/2018)  Social Connections: Socially Isolated (09/09/2023)  Tobacco Use: Low Risk  (09/09/2023)    Readmission Risk Interventions     No data to display

## 2023-09-23 NOTE — Progress Notes (Signed)
Nutrition Follow-up  DOCUMENTATION CODES:   Severe malnutrition in context of chronic illness  INTERVENTION:   -Continue dysphagia 3 diet -Continue Ensure Enlive po TID, each supplement provides 350 kcal and 20 grams of protein -Continue Magic cup TID with meals, each supplement provides 290 kcal and 9 grams of protein  -Continue 500 mg vitamin C BID  NUTRITION DIAGNOSIS:   Severe Malnutrition related to chronic illness as evidenced by severe fat depletion, severe muscle depletion.  Ongoing  GOAL:   Patient will meet greater than or equal to 90% of their needs  Progressing   MONITOR:   PO intake, Supplement acceptance, Labs, Weight trends, I & O's, Skin  REASON FOR ASSESSMENT:   Consult Assessment of nutrition requirement/status  ASSESSMENT:   89 y/o female with h/o HTN, COPD, hypothyroidism, depression, anxiety, OSA, bedbound with functional quadriplegia, CAD, HLD, MI and chronic left buttock wound who is admitted with left gluteal abscess, AKI, AMS and anemia now s/p I & D 1/6.  Reviewed I/O's: -260 ml x 24 hours and +357 ml since admission  UOP: 500 ml x 24 hours  Family declining further debridement of ulcers.   Pt remains on a dysphagia 3 diet. Pt with good oral intake; noted meal completions 50-100%. She is drinking Ensure supplements.   No wt loss noted over the past week.   Per palliative care notes, goals of care are established. Pt is a DNR/ DNI and plans to d/c to SNF.   Per TOC notes, pt awaiting SNF placement.   Medications reviewed and include vitamin C, cipro, MVI, florastor, and senokot.   Labs reviewed.   Diet Order:   Diet Order             DIET DYS 3 Room service appropriate? Yes with Assist; Fluid consistency: Thin  Diet effective now                   EDUCATION NEEDS:   Education needs have been addressed  Skin:  Skin Assessment: Reviewed RN Assessment (Stage III L buttocks 2X5X1cm, skin tears)  Last BM:  09/23/23 (type  6)  Height:   Ht Readings from Last 1 Encounters:  09/07/23 5\' 3"  (1.6 m)    Weight:   Wt Readings from Last 1 Encounters:  09/20/23 111 kg    Ideal Body Weight:  52 kg  BMI:  Body mass index is 43.35 kg/m.  Estimated Nutritional Needs:   Kcal:  1600-1800kcal/day  Protein:  80-90g/day  Fluid:  1.4-1.6L/day    Levada Schilling, RD, LDN, CDCES Registered Dietitian III Certified Diabetes Care and Education Specialist If unable to reach this RD, please use "RD Inpatient" group chat on secure chat between hours of 8am-4 pm daily

## 2023-09-23 NOTE — Progress Notes (Signed)
Progress Note    Danielle [name]  IRJ:[id number] DOB: 07/17/1929  DOA: 09/07/2023 PCP: Hillery Aldo, MD      Brief Narrative:    Medical records reviewed and are as summarized below:  Danielle [name] is a 89 y.o. female with medical history significant for HTN, CAD, hypothyroidism, depression, COPD, OSA on CPAP, osteoarthritis, nonambulant at baseline and total care dependent with known left ischial and sacral decubitus ulcers, managed by home health nurse.  She was brought to the hospital because of general weakness of bowel 1 week duration and change in mental status ("talking out of her head") of about 2 days duration      Assessment/Plan:   Principal Problem:   Infected decubitus ulcer Active Problems:   Acute metabolic encephalopathy   AKI (acute kidney injury) (HCC)   Chronic anemia   Osteoarthritis of knees, bilateral   Bedbound   Advanced age   Coronary arteriosclerosis in native artery   Hypertension   Chronic obstructive pulmonary disease (HCC)   Hypothyroidism, unspecified   Depression   OSA on CPAP   Functional quadriplegia (HCC)   Sacral wound, initial encounter   Protein-calorie malnutrition, severe   Nutrition Problem: Severe Malnutrition Etiology: chronic illness  Signs/Symptoms: severe fat depletion, severe muscle depletion   Body mass index is 43.35 kg/m.  (Morbid obesity)   Infected decubitus ulcer, buttock abscess Wound culture showed multiple organisms E. coli, Morganella, Enterococcus faecalis and Streptococcus agalactiae.  Previously treated with IV antibiotics including ceftriaxone, vancomycin and Zosyn. Started on Augmentin and ciprofloxacin on 09/18/2023.  Continue Augmentin and ciprofloxacin through 09/27/2023 S/p I&D of left buttock abscess on 09/12/2023. General surgery recommended more extensive debridement of infected decubitus ulcers but family declined.  BMP on 09/23/2023 showed mildly elevated BUN at 30 otherwise   unremarkable.    Acute on Chronic anemia Anemia of chronic disease, suspect chronic blood loss from wound Iron 24, ferritin 732, folate normal, B12 high (2,049) H&H 6.7 09/14/23 got 1 unit of blood transfusion. Hemoglobin is stable, 8.   AKI (acute kidney injury) (HCC) Dehydration Improved Avoid nephrotoxins.      Acute metabolic encephalopathy-improved She is back to her baseline mental status Continue Aricept.    Hypertension: BP is okay.  Continue amlodipine   Hypokalemia Improved   Osteoarthritis of knees, bilateral Bedbound status secondary to osteoarthritis Advanced age and frailty/functional quadriplegia Patient is total care dependent Patient had been receiving knee injections due to chronic pain Analgesics as needed. PT OT advised long-term care. Family wishes her to be in local facility, [facility] working on placement.    Comorbidities include COPD, OSA on CPAP, depression, hypothyroidism   Medically stable for discharge.  Awaiting placement to long-term care facility.     Diet Order             DIET DYS 3 Room service appropriate? Yes with Assist; Fluid consistency: Thin  Diet effective now                            Consultants: General surgeon Palliative care  Procedures: I&D of left posterior thigh abscess    Medications:    amLODipine  5 mg Oral Daily   amoxicillin-clavulanate  1 tablet Oral BID   vitamin C  500 mg Oral BID   ciprofloxacin  500 mg Oral BID   diclofenac Sodium  2 g Topical QID   donepezil  5 mg Oral QHS  enoxaparin (LOVENOX) injection  40 mg Subcutaneous Q24H   feeding supplement  237 mL Oral TID BM   levothyroxine  150 mcg Oral Q0600   mirabegron ER  25 mg Oral Daily   multivitamin with minerals  1 tablet Oral Daily   saccharomyces boulardii  250 mg Oral BID   senna  1 tablet Oral Daily   sertraline  50 mg Oral Daily   Continuous Infusions:     Anti-infectives (From admission, onward)     Start     Dose/Rate Route Frequency Ordered Stop   09/18/23 1200  ciprofloxacin (CIPRO) tablet 500 mg        500 mg Oral 2 times daily 09/18/23 0738 09/28/23 0959   09/18/23 1200  amoxicillin-clavulanate (AUGMENTIN) 500-125 MG per tablet 1 tablet        1 tablet Oral 2 times daily 09/18/23 0738 09/28/23 0959   09/12/23 2200  piperacillin-tazobactam (ZOSYN) IVPB 3.375 g  Status:  Discontinued        3.375 g 12.5 mL/hr over 240 Minutes Intravenous Every 8 hours 09/12/23 1544 09/18/23 0738   09/12/23 1400  amoxicillin (AMOXIL) capsule 500 mg        500 mg Oral  Once 09/12/23 1256 09/12/23 1436   09/11/23 1800  ceFEPIme (MAXIPIME) 2 g in sodium chloride 0.9 % 100 mL IVPB  Status:  Discontinued        2 g 200 mL/hr over 30 Minutes Intravenous Every 12 hours 09/11/23 1553 09/12/23 1544   09/09/23 0900  vancomycin (VANCOREADY) IVPB 750 mg/150 mL  Status:  Discontinued        750 mg 150 mL/hr over 60 Minutes Intravenous Every 24 hours 09/09/23 0800 09/12/23 1544   09/08/23 2300  cefTRIAXone (ROCEPHIN) 1 g in sodium chloride 0.9 % 100 mL IVPB  Status:  Discontinued        1 g 200 mL/hr over 30 Minutes Intravenous Every 24 hours 09/08/23 0231 09/11/23 1553   09/08/23 0300  vancomycin variable dose per unstable renal function (pharmacist dosing)  Status:  Discontinued         Does not apply See admin instructions 09/08/23 0300 09/09/23 0800   09/08/23 0300  vancomycin (VANCOREADY) IVPB 500 mg/100 mL        500 mg 100 mL/hr over 60 Minutes Intravenous  Once 09/08/23 0257 09/08/23 0448   09/07/23 2245  cefTRIAXone (ROCEPHIN) 2 g in sodium chloride 0.9 % 100 mL IVPB        2 g 200 mL/hr over 30 Minutes Intravenous Once 09/07/23 2240 09/07/23 2331   09/07/23 2245  vancomycin (VANCOCIN) IVPB 1000 mg/200 mL premix        1,000 mg 200 mL/hr over 60 Minutes Intravenous  Once 09/07/23 2240 09/08/23 0044              Family Communication/Anticipated D/C date and plan/Code Status   DVT  prophylaxis: enoxaparin (LOVENOX) injection 40 mg Start: 09/10/23 0800     Code Status: Limited: Do not attempt resuscitation (DNR) -DNR-LIMITED -Do Not Intubate/DNI   Family Communication: Plan discussed with Danielle [name], son, at the bedside Disposition Plan: Plan to discharge to long-term care facility   Status is: Inpatient Remains inpatient appropriate because: Awaiting placement       Subjective:   No complaints.  She is feeling well.  Danielle [name], son, was at the bedside  Objective:    Vitals:   09/22/23 2020 09/23/23 0437 09/23/23 0439 09/23/23 1635  BP: 132/60 Marland Kitchen)  152/56 (!) 159/55 (!) 152/56  Pulse: 62 62 64 64  Resp: 16 20 20 18   Temp: 97.9 F (36.6 C) 97.7 F (36.5 C) 98.5 F (36.9 C) 98.5 F (36.9 C)  TempSrc: Oral Oral Oral Oral  SpO2: 100% 97% 100% 100%  Weight:      Height:       No data found.   Intake/Output Summary (Last 24 hours) at 09/23/2023 1733 Last data filed at 09/23/2023 1406 Gross per 24 hour  Intake 720 ml  Output 500 ml  Net 220 ml   Filed Weights   09/17/23 0500 09/18/23 0425 09/20/23 0336  Weight: 105.7 kg 106.6 kg 111 kg    Exam:  GEN: NAD SKIN: Warm and dry EYES: No pallor or icterus ENT: MMM CV: RRR PULM: CTA B ABD: soft, ND, NT, +BS CNS: AAO x 3, non focal EXT: No edema or tenderness      Data Reviewed:   I have personally reviewed following labs and imaging studies:  Labs: Labs show the following:   Basic Metabolic Panel: Recent Labs  Lab 09/23/23 0528  NA 135  K 3.9  CL 103  CO2 25  GLUCOSE 102*  BUN 30*  CREATININE 0.87  CALCIUM 9.1  MG 2.2    GFR Estimated Creatinine Clearance: 49.4 mL/min (by C-G formula based on SCr of 0.87 mg/dL). Liver Function Tests: No results for input(s): "AST", "ALT", "ALKPHOS", "BILITOT", "PROT", "ALBUMIN" in the last 168 hours. No results for input(s): "LIPASE", "AMYLASE" in the last 168 hours. No results for input(s): "AMMONIA" in the last 168 hours. Coagulation  profile No results for input(s): "INR", "PROTIME" in the last 168 hours.  CBC: Recent Labs  Lab 09/17/23 0529 09/23/23 0528  WBC  --  4.1  HGB 9.0* 8.0*  HCT 27.9* 24.7*  MCV  --  95.4  PLT  --  251   Cardiac Enzymes: No results for input(s): "CKTOTAL", "CKMB", "CKMBINDEX", "TROPONINI" in the last 168 hours. BNP (last 3 results) No results for input(s): "PROBNP" in the last 8760 hours. CBG: No results for input(s): "GLUCAP" in the last 168 hours. D-Dimer: No results for input(s): "DDIMER" in the last 72 hours. Hgb A1c: No results for input(s): "HGBA1C" in the last 72 hours. Lipid Profile: No results for input(s): "CHOL", "HDL", "LDLCALC", "TRIG", "CHOLHDL", "LDLDIRECT" in the last 72 hours. Thyroid function studies: No results for input(s): "TSH", "T4TOTAL", "T3FREE", "THYROIDAB" in the last 72 hours.  Invalid input(s): "FREET3" Anemia work up: No results for input(s): "VITAMINB12", "FOLATE", "FERRITIN", "TIBC", "IRON", "RETICCTPCT" in the last 72 hours. Sepsis Labs: Recent Labs  Lab 09/23/23 0528  WBC 4.1     Microbiology No results found for this or any previous visit (from the past 240 hours).   Procedures and diagnostic studies:  No results found.             LOS: 15 days   Paxon Propes  [employer] Chartered loss adjuster on [email or web address]. If 7PM-7AM, please contact night-coverage at [email or web address]     09/23/2023, 5:33 PM

## 2023-09-24 DIAGNOSIS — L8993 Pressure ulcer of unspecified site, stage 3: Secondary | ICD-10-CM | POA: Diagnosis not present

## 2023-09-24 DIAGNOSIS — L089 Local infection of the skin and subcutaneous tissue, unspecified: Secondary | ICD-10-CM | POA: Diagnosis not present

## 2023-09-24 NOTE — TOC Progression Note (Addendum)
Transition of Care [facility]) - Progression Note    Patient Details  Name: Danielle [name] MRN: [id number] Date of Birth: September 21, 1928  Transition of Care [facility]) CM/SW Contact  Lawerance Sabal, RN Phone Number: 09/24/2023, 3:06 PM  Clinical Narrative:     Notified by provider that patient will need low loss air mattress and hoyer lift for home.  LVM with daughter  for call back.  Will assist with ordering through DME provider of choice once call returned.   Daughter would like to use [facility] [phone].   I called and spoke to them, they would need to get insurance auth for the mattress. I have faxed over WOC note, demographics, and orders. [phone]    Daughter states that they have handicapp accessible bathroom, WC, transport chair, BSC, hosiptal bed, trapeze and lift chair.   Discussed w [name] that [facility] states that they are unable to take the back into services at time of discharge. She feel like she will be able to do dressing change, I recommend that she performs a dressing change with the nurse prior to DC to assess this.  She states that she was unable to get a hold of her PCS aid today (offices likely closed due to weather) and will try again tomorrow.  We discussed DC once DME delivered, to anticipate in next 24-48 hours.      Kameisha, [place] (Daughter) [phone]    Expected Discharge Plan: Home w Home Health Services Barriers to Discharge: Other (must enter comment) (Waiting to confirm when CAP services can be restarted)  Expected Discharge Plan and Services       Living arrangements for the past 2 months: Single Family Home                                       Social Determinants of Health (SDOH) Interventions SDOH Screenings   Food Insecurity: No Food Insecurity (09/09/2023)  Housing: Low Risk  (09/09/2023)  Transportation Needs: No Transportation Needs (09/09/2023)  Utilities: Not At Risk (09/09/2023)  Alcohol Screen:  Low Risk  (10/06/2018)  Depression (PHQ2-9): Low Risk  (10/06/2018)  Social Connections: Socially Isolated (09/09/2023)  Tobacco Use: Low Risk  (09/09/2023)    Readmission Risk Interventions     No data to display

## 2023-09-24 NOTE — Progress Notes (Signed)
Demonstrated how to change dressings with daughter present.

## 2023-09-24 NOTE — Progress Notes (Signed)
PROGRESS NOTE    Danielle [name]  ZOX:[id number] DOB: [birth year] DOA: 09/07/2023 PCP: Hillery Aldo, MD    Brief Narrative:  Danielle [name] is a 89 y.o. female with medical history significant for HTN, CAD, hypothyroidism, depression, COPD, OSA on CPAP, osteoarthritis, nonambulant at baseline and total care dependent with known left ischial and sacral decubitus ulcers, managed by home health nurse.  She was brought to the hospital because of general weakness of bowel 1 week duration and change in mental status ("talking out of her head") of about 2 days duration    Assessment & Plan:   Principal Problem:   Infected decubitus ulcer Active Problems:   Acute metabolic encephalopathy   AKI (acute kidney injury) (HCC)   Chronic anemia   Osteoarthritis of knees, bilateral   Bedbound   Advanced age   Coronary arteriosclerosis in native artery   Hypertension   Chronic obstructive pulmonary disease (HCC)   Hypothyroidism, unspecified   Depression   OSA on CPAP   Functional quadriplegia (HCC)   Sacral wound, initial encounter   Protein-calorie malnutrition, severe  Infected decubitus ulcer, buttock abscess Wound culture showed multiple organisms E. coli, Morganella, Enterococcus faecalis and Streptococcus agalactiae.  Previously treated with IV antibiotics including ceftriaxone, vancomycin and Zosyn. Started on Augmentin and ciprofloxacin on 09/18/2023.  Continue Augmentin and ciprofloxacin through 09/27/2023 S/p I&D of left buttock abscess on 09/12/2023. General surgery recommended more extensive debridement of infected decubitus ulcers but family declined.       Acute on Chronic anemia Anemia of chronic disease, suspect chronic blood loss from wound Iron 24, ferritin 732, folate normal, B12 high (2,049) H&H 6.7 09/14/23 got 1 unit of blood transfusion. Hemoglobin is stable, 8.     AKI (acute kidney injury) (HCC) Dehydration Improved Avoid nephrotoxins.       Acute metabolic  encephalopathy-improved She is back to her baseline mental status Continue Aricept.     Hypertension: BP well-controlled Continue Norvasc     Hypokalemia Improved     Osteoarthritis of knees, bilateral Bedbound status secondary to osteoarthritis Advanced age and frailty/functional quadriplegia Patient is total care dependent Patient had been receiving knee injections due to chronic pain Analgesics as needed. PT OT advised long-term care. Unfortunately financial constraints are preventing placement in skilled nursing facility. TOC working on home health services     Comorbidities include COPD, OSA on CPAP, depression, hypothyroidism     Medically stable for discharge.  Awaiting appropriate and safe disposition for discharge home   DVT prophylaxis: Lovenox Code Status: DNR Family Communication: Daughter via phone 1/22 Disposition Plan: Status is: Inpatient Remains inpatient appropriate because: Unsafe discharge plan   Level of care: Telemetry Medical  Consultants:  None  Procedures:  None  Antimicrobials: Augmentin Cipro    Subjective: Seen and examined.  Resting in bed.  Family at bedside.  No distress.  Appears frail  Objective: Vitals:   09/23/23 1635 09/23/23 2137 09/24/23 0320 09/24/23 0802  BP: (!) 152/56 (!) 141/55 (!) 119/51 (!) 144/53  Pulse: 64 65 65 (!) 58  Resp: 18 16 18 16   Temp: 98.5 F (36.9 C) 98 F (36.7 C) 97.8 F (36.6 C) 97.8 F (36.6 C)  TempSrc: Oral Oral Oral Oral  SpO2: 100% 100% 98% 100%  Weight:   112 kg   Height:        Intake/Output Summary (Last 24 hours) at 09/24/2023 1428 Last data filed at 09/24/2023 1424 Gross per 24 hour  Intake 240 ml  Output --  Net 240 ml   Filed Weights   09/18/23 0425 09/20/23 0336 09/24/23 0320  Weight: 106.6 kg 111 kg 112 kg    Examination:  General exam: Appears frail and chronically ill Respiratory system: Clear to auscultation. Respiratory effort normal. Cardiovascular  system: S1-S2, RRR, no murmurs, no pedal edema Gastrointestinal system: Thin, soft, nontender, nondistended, normal bowel sounds Central nervous system: Alert and oriented x 2. No focal neurological deficits. Extremities: Symmetric 5 x 5 power. Skin: Stage IV sacral Psychiatry: Judgement and insight appear impaired. Mood & affect flattened.     Data Reviewed: I have personally reviewed following labs and imaging studies  CBC: Recent Labs  Lab 09/23/23 0528  WBC 4.1  HGB 8.0*  HCT 24.7*  MCV 95.4  PLT 251   Basic Metabolic Panel: Recent Labs  Lab 09/23/23 0528  NA 135  K 3.9  CL 103  CO2 25  GLUCOSE 102*  BUN 30*  CREATININE 0.87  CALCIUM 9.1  MG 2.2   GFR: Estimated Creatinine Clearance: 49.6 mL/min (by C-G formula based on SCr of 0.87 mg/dL). Liver Function Tests: No results for input(s): "AST", "ALT", "ALKPHOS", "BILITOT", "PROT", "ALBUMIN" in the last 168 hours. No results for input(s): "LIPASE", "AMYLASE" in the last 168 hours. No results for input(s): "AMMONIA" in the last 168 hours. Coagulation Profile: No results for input(s): "INR", "PROTIME" in the last 168 hours. Cardiac Enzymes: No results for input(s): "CKTOTAL", "CKMB", "CKMBINDEX", "TROPONINI" in the last 168 hours. BNP (last 3 results) No results for input(s): "PROBNP" in the last 8760 hours. HbA1C: No results for input(s): "HGBA1C" in the last 72 hours. CBG: No results for input(s): "GLUCAP" in the last 168 hours. Lipid Profile: No results for input(s): "CHOL", "HDL", "LDLCALC", "TRIG", "CHOLHDL", "LDLDIRECT" in the last 72 hours. Thyroid Function Tests: No results for input(s): "TSH", "T4TOTAL", "FREET4", "T3FREE", "THYROIDAB" in the last 72 hours. Anemia Panel: No results for input(s): "VITAMINB12", "FOLATE", "FERRITIN", "TIBC", "IRON", "RETICCTPCT" in the last 72 hours. Sepsis Labs: No results for input(s): "PROCALCITON", "LATICACIDVEN" in the last 168 hours.  No results found for this or  any previous visit (from the past 240 hours).       Radiology Studies: No results found.      Scheduled Meds:  amLODipine  5 mg Oral Daily   amoxicillin-clavulanate  1 tablet Oral BID   vitamin C  500 mg Oral BID   ciprofloxacin  500 mg Oral BID   diclofenac Sodium  2 g Topical QID   donepezil  5 mg Oral QHS   enoxaparin (LOVENOX) injection  40 mg Subcutaneous Q24H   feeding supplement  237 mL Oral TID BM   levothyroxine  150 mcg Oral Q0600   mirabegron ER  25 mg Oral Daily   multivitamin with minerals  1 tablet Oral Daily   saccharomyces boulardii  250 mg Oral BID   senna  1 tablet Oral Daily   sertraline  50 mg Oral Daily   Continuous Infusions:   LOS: 16 days     Tresa Moore, MD [facility]   If 7PM-7AM, please contact night-coverage  09/24/2023, 2:28 PM

## 2023-09-24 NOTE — TOC Progression Note (Signed)
Transition of Care [facility]) - Progression Note    Patient Details  Name: Danielle [name] MRN: [id number] Date of Birth: [birth year]  [facility]) CM/SW Contact  Erin Sons, [place] Phone Number: 09/24/2023, 2:00 PM  Clinical Narrative:     CSW contacted [facility] for update; liaison is still awaiting to complete financial arrangement with family.   CSW called pt's daughter who stated she did speak with [facility] yesterday and stated that they were requiring about 8000$ upfront to admit pt. Daughter states they are not able to manage that cost and that pt also does not want to go to [facility]. CSW explained alternative option would be fore pt to return home. Daughter states that pt has CAPS services about 50 hours/week of Aides. CSW explains various additional services: palliative vs hospice vs HH. Daughter would be interested in [facility] RN and is aware they would  come temporarily, a couple times/week. CSW explained that pt is ready for DC and for daughter to check with CAPS caseworker about how soon pt's aides can resume service.   Attending notified.   Expected Discharge Plan: Home w Home Health Services Barriers to Discharge: Other (must enter comment) (Waiting to confirm when CAP services can be restarted)  Expected Discharge Plan and Services       Living arrangements for the past 2 months: Single Family Home                                       Social Determinants of Health (SDOH) Interventions SDOH Screenings   Food Insecurity: No Food Insecurity (09/09/2023)  Housing: Low Risk  (09/09/2023)  Transportation Needs: No Transportation Needs (09/09/2023)  Utilities: Not At Risk (09/09/2023)  Alcohol Screen: Low Risk  (10/06/2018)  Depression (PHQ2-9): Low Risk  (10/06/2018)  Social Connections: Socially Isolated (09/09/2023)  Tobacco Use: Low Risk  (09/09/2023)    Readmission Risk Interventions     No data to display

## 2023-09-24 NOTE — Plan of Care (Signed)
  Problem: Education: Goal: Knowledge of General Education information will improve Description: Including pain rating scale, medication(s)/side effects and non-pharmacologic comfort measures Outcome: Progressing   Problem: Health Behavior/Discharge Planning: Goal: Ability to manage health-related needs will improve Outcome: Progressing   Problem: Clinical Measurements: Goal: Ability to maintain clinical measurements within normal limits will improve Outcome: Progressing Goal: Will remain free from infection Outcome: Progressing Goal: Diagnostic test results will improve Outcome: Progressing Goal: Respiratory complications will improve Outcome: Progressing Goal: Cardiovascular complication will be avoided Outcome: Progressing   Problem: Activity: Goal: Risk for activity intolerance will decrease Outcome: Progressing   Problem: Nutrition: Goal: Adequate nutrition will be maintained Outcome: Progressing   Problem: Coping: Goal: Level of anxiety will decrease Outcome: Progressing   Problem: Elimination: Goal: Will not experience complications related to bowel motility Outcome: Progressing Goal: Will not experience complications related to urinary retention Outcome: Progressing   Problem: Pain Management: Goal: General experience of comfort will improve Outcome: Progressing   Problem: Safety: Goal: Ability to remain free from injury will improve Outcome: Progressing   Problem: Skin Integrity: Goal: Risk for impaired skin integrity will decrease Outcome: Progressing   Problem: Clinical Measurements: Goal: Ability to avoid or minimize complications of infection will improve Outcome: Progressing   Problem: Skin Integrity: Goal: Skin integrity will improve Outcome: Progressing

## 2023-09-25 DIAGNOSIS — L089 Local infection of the skin and subcutaneous tissue, unspecified: Secondary | ICD-10-CM | POA: Diagnosis not present

## 2023-09-25 DIAGNOSIS — L8993 Pressure ulcer of unspecified site, stage 3: Secondary | ICD-10-CM | POA: Diagnosis not present

## 2023-09-25 NOTE — Progress Notes (Signed)
PROGRESS NOTE    Danielle [name]  KGM:[id number] DOB: April 23, 1929 DOA: 09/07/2023 PCP: Hillery Aldo, MD    Brief Narrative:  Danielle [name] is a 89 y.o. female with medical history significant for HTN, CAD, hypothyroidism, depression, COPD, OSA on CPAP, osteoarthritis, nonambulant at baseline and total care dependent with known left ischial and sacral decubitus ulcers, managed by home health nurse.  She was brought to the hospital because of general weakness of bowel 1 week duration and change in mental status ("talking out of her head") of about 2 days duration    Assessment & Plan:   Principal Problem:   Infected decubitus ulcer Active Problems:   Acute metabolic encephalopathy   AKI (acute kidney injury) (HCC)   Chronic anemia   Osteoarthritis of knees, bilateral   Bedbound   Advanced age   Coronary arteriosclerosis in native artery   Hypertension   Chronic obstructive pulmonary disease (HCC)   Hypothyroidism, unspecified   Depression   OSA on CPAP   Functional quadriplegia (HCC)   Sacral wound, initial encounter   Protein-calorie malnutrition, severe  Infected decubitus ulcer, buttock abscess Wound culture showed multiple organisms E. coli, Morganella, Enterococcus faecalis and Streptococcus agalactiae.  Previously treated with IV antibiotics including ceftriaxone, vancomycin and Zosyn. Started on Augmentin and ciprofloxacin on 09/18/2023.  Continue Augmentin and ciprofloxacin through 09/27/2023 S/p I&D of left buttock abscess on 09/12/2023. General surgery recommended more extensive debridement of infected decubitus ulcers but family declined.       Acute on Chronic anemia Anemia of chronic disease, suspect chronic blood loss from wound Iron 24, ferritin 732, folate normal, B12 high (2,049) H&H 6.7 09/14/23 got 1 unit of blood transfusion. Hemoglobin is stable, 8.     AKI (acute kidney injury) (HCC) Dehydration Improved Avoid nephrotoxins.       Acute metabolic  encephalopathy-improved She is back to her baseline mental status Continue Aricept.     Hypertension: BP well-controlled Continue Norvasc     Hypokalemia Improved     Osteoarthritis of knees, bilateral Bedbound status secondary to osteoarthritis Advanced age and frailty/functional quadriplegia Patient is total care dependent Patient had been receiving knee injections due to chronic pain Analgesics as needed. PT OT advised long-term care. Unfortunately financial constraints are preventing placement in skilled nursing facility. TOC working on home health services Home DME ordered including low-air-loss mattress and Hoyer lift Discharge pending delivery of home DME     Comorbidities include COPD, OSA on CPAP, depression, hypothyroidism     Medically stable for discharge.  Awaiting appropriate and safe disposition for discharge home   DVT prophylaxis: Lovenox Code Status: DNR Family Communication: Daughter via phone 1/22 Disposition Plan: Status is: Inpatient Remains inpatient appropriate because: Unsafe discharge plan   Level of care: Telemetry Medical  Consultants:  None  Procedures:  None  Antimicrobials: Augmentin Cipro    Subjective: Seen and examined.  Resting in bed.  Family at bedside  Objective: Vitals:   09/24/23 2049 09/25/23 0438 09/25/23 0439 09/25/23 0923  BP: 121/61  (!) 141/51 (!) 137/50  Pulse: 62  (!) 56 (!) 56  Resp: 17  17 18   Temp: 98.2 F (36.8 C)  97.7 F (36.5 C) 98 F (36.7 C)  TempSrc: Oral  Oral Oral  SpO2: 100%  100% 99%  Weight:  110 kg    Height:        Intake/Output Summary (Last 24 hours) at 09/25/2023 1144 Last data filed at 09/25/2023 1049 Gross per 24 hour  Intake 360 ml  Output 500 ml  Net -140 ml   Filed Weights   09/20/23 0336 09/24/23 0320 09/25/23 0438  Weight: 111 kg 112 kg 110 kg    Examination:  General exam: NAD.  Frail-appearing Respiratory system: Poor respiratory effort.  Bibasilar  crackles.  Normal work of breathing.  Room air Cardiovascular system: S1-S2, RRR, no murmurs, no pedal edema Gastrointestinal system: Thin, soft, nontender, nondistended, normal bowel sounds Central nervous system: Alert and oriented x 2. No focal neurological deficits. Extremities: Symmetric 5 x 5 power. Skin: Stage IV sacral Psychiatry: Judgement and insight appear impaired. Mood & affect flattened.     Data Reviewed: I have personally reviewed following labs and imaging studies  CBC: Recent Labs  Lab 09/23/23 0528  WBC 4.1  HGB 8.0*  HCT 24.7*  MCV 95.4  PLT 251   Basic Metabolic Panel: Recent Labs  Lab 09/23/23 0528  NA 135  K 3.9  CL 103  CO2 25  GLUCOSE 102*  BUN 30*  CREATININE 0.87  CALCIUM 9.1  MG 2.2   GFR: Estimated Creatinine Clearance: 49.1 mL/min (by C-G formula based on SCr of 0.87 mg/dL). Liver Function Tests: No results for input(s): "AST", "ALT", "ALKPHOS", "BILITOT", "PROT", "ALBUMIN" in the last 168 hours. No results for input(s): "LIPASE", "AMYLASE" in the last 168 hours. No results for input(s): "AMMONIA" in the last 168 hours. Coagulation Profile: No results for input(s): "INR", "PROTIME" in the last 168 hours. Cardiac Enzymes: No results for input(s): "CKTOTAL", "CKMB", "CKMBINDEX", "TROPONINI" in the last 168 hours. BNP (last 3 results) No results for input(s): "PROBNP" in the last 8760 hours. HbA1C: No results for input(s): "HGBA1C" in the last 72 hours. CBG: No results for input(s): "GLUCAP" in the last 168 hours. Lipid Profile: No results for input(s): "CHOL", "HDL", "LDLCALC", "TRIG", "CHOLHDL", "LDLDIRECT" in the last 72 hours. Thyroid Function Tests: No results for input(s): "TSH", "T4TOTAL", "FREET4", "T3FREE", "THYROIDAB" in the last 72 hours. Anemia Panel: No results for input(s): "VITAMINB12", "FOLATE", "FERRITIN", "TIBC", "IRON", "RETICCTPCT" in the last 72 hours. Sepsis Labs: No results for input(s): "PROCALCITON",  "LATICACIDVEN" in the last 168 hours.  No results found for this or any previous visit (from the past 240 hours).       Radiology Studies: No results found.      Scheduled Meds:  amLODipine  5 mg Oral Daily   amoxicillin-clavulanate  1 tablet Oral BID   vitamin C  500 mg Oral BID   ciprofloxacin  500 mg Oral BID   diclofenac Sodium  2 g Topical QID   donepezil  5 mg Oral QHS   enoxaparin (LOVENOX) injection  40 mg Subcutaneous Q24H   feeding supplement  237 mL Oral TID BM   levothyroxine  150 mcg Oral Q0600   mirabegron ER  25 mg Oral Daily   multivitamin with minerals  1 tablet Oral Daily   saccharomyces boulardii  250 mg Oral BID   senna  1 tablet Oral Daily   sertraline  50 mg Oral Daily   Continuous Infusions:   LOS: 17 days     Tresa Moore, MD [facility]   If 7PM-7AM, please contact night-coverage  09/25/2023, 11:44 AM

## 2023-09-25 NOTE — Plan of Care (Signed)
  Problem: Education: Goal: Knowledge of General Education information will improve Description: Including pain rating scale, medication(s)/side effects and non-pharmacologic comfort measures Outcome: Progressing   Problem: Health Behavior/Discharge Planning: Goal: Ability to manage health-related needs will improve Outcome: Progressing   Problem: Clinical Measurements: Goal: Ability to maintain clinical measurements within normal limits will improve Outcome: Progressing Goal: Will remain free from infection Outcome: Progressing Goal: Diagnostic test results will improve Outcome: Progressing Goal: Respiratory complications will improve Outcome: Progressing Goal: Cardiovascular complication will be avoided Outcome: Progressing   Problem: Activity: Goal: Risk for activity intolerance will decrease Outcome: Progressing   Problem: Nutrition: Goal: Adequate nutrition will be maintained Outcome: Progressing   Problem: Coping: Goal: Level of anxiety will decrease Outcome: Progressing   Problem: Elimination: Goal: Will not experience complications related to bowel motility Outcome: Progressing Goal: Will not experience complications related to urinary retention Outcome: Progressing   Problem: Pain Management: Goal: General experience of comfort will improve Outcome: Progressing   Problem: Safety: Goal: Ability to remain free from injury will improve Outcome: Progressing   Problem: Skin Integrity: Goal: Risk for impaired skin integrity will decrease Outcome: Progressing   Problem: Clinical Measurements: Goal: Ability to avoid or minimize complications of infection will improve Outcome: Progressing   Problem: Skin Integrity: Goal: Skin integrity will improve Outcome: Progressing

## 2023-09-25 NOTE — Plan of Care (Signed)

## 2023-09-26 DIAGNOSIS — L089 Local infection of the skin and subcutaneous tissue, unspecified: Secondary | ICD-10-CM | POA: Diagnosis not present

## 2023-09-26 DIAGNOSIS — L8993 Pressure ulcer of unspecified site, stage 3: Secondary | ICD-10-CM | POA: Diagnosis not present

## 2023-09-26 LAB — CBC WITH DIFFERENTIAL/PLATELET
Abs Immature Granulocytes: 0.03 10*3/uL (ref 0.00–0.07)
Basophils Absolute: 0 10*3/uL (ref 0.0–0.1)
Basophils Relative: 0 %
Eosinophils Absolute: 0 10*3/uL (ref 0.0–0.5)
Eosinophils Relative: 0 %
HCT: 26.1 % — ABNORMAL LOW (ref 36.0–46.0)
Hemoglobin: 8.4 g/dL — ABNORMAL LOW (ref 12.0–15.0)
Immature Granulocytes: 1 %
Lymphocytes Relative: 24 %
Lymphs Abs: 0.8 10*3/uL (ref 0.7–4.0)
MCH: 31.1 pg (ref 26.0–34.0)
MCHC: 32.2 g/dL (ref 30.0–36.0)
MCV: 96.7 fL (ref 80.0–100.0)
Monocytes Absolute: 0.4 10*3/uL (ref 0.1–1.0)
Monocytes Relative: 11 %
Neutro Abs: 2.2 10*3/uL (ref 1.7–7.7)
Neutrophils Relative %: 64 %
Platelets: 251 10*3/uL (ref 150–400)
RBC: 2.7 MIL/uL — ABNORMAL LOW (ref 3.87–5.11)
RDW: 20.5 % — ABNORMAL HIGH (ref 11.5–15.5)
WBC: 3.4 10*3/uL — ABNORMAL LOW (ref 4.0–10.5)
nRBC: 0 % (ref 0.0–0.2)

## 2023-09-26 LAB — BASIC METABOLIC PANEL
Anion gap: 7 (ref 5–15)
BUN: 22 mg/dL (ref 8–23)
CO2: 24 mmol/L (ref 22–32)
Calcium: 9.2 mg/dL (ref 8.9–10.3)
Chloride: 106 mmol/L (ref 98–111)
Creatinine, Ser: 0.84 mg/dL (ref 0.44–1.00)
GFR, Estimated: >60 mL/min (ref >60)
Glucose, Bld: 82 mg/dL (ref 70–99)
Potassium: 4.1 mmol/L (ref 3.5–5.1)
Sodium: 137 mmol/L (ref 135–145)

## 2023-09-26 NOTE — Progress Notes (Signed)
    Durable Medical Equipment  (From admission, onward)           Start     Ordered   09/26/23 1505  For home use only DME Air overlay mattress  Once       Comments: Low air loss mattress for chronic stage 3 pressure ulcer with extensive tunneling, Patient also suffers from incontinence, and inability to reposition frequently   09/26/23 1505   09/24/23 1445  For home use only DME Other see comment  Once       Comments: Michiel Sites lift  Question:  Length of Need  Answer:  Lifetime   09/24/23 1444           Wound Measurements 2X5X1cm. Stage 3

## 2023-09-26 NOTE — Progress Notes (Signed)
PROGRESS NOTE    Danielle [name]  ZHY:[id number] DOB: [birth year] DOA: 09/07/2023 PCP: Hillery Aldo, MD    Brief Narrative:  Danielle [name] is a 89 y.o. female with medical history significant for HTN, CAD, hypothyroidism, depression, COPD, OSA on CPAP, osteoarthritis, nonambulant at baseline and total care dependent with known left ischial and sacral decubitus ulcers, managed by home health nurse.  She was brought to the hospital because of general weakness of bowel 1 week duration and change in mental status ("talking out of her head") of about 2 days duration    Assessment & Plan:   Principal Problem:   Infected decubitus ulcer Active Problems:   Acute metabolic encephalopathy   AKI (acute kidney injury) (HCC)   Chronic anemia   Osteoarthritis of knees, bilateral   Bedbound   Advanced age   Coronary arteriosclerosis in native artery   Hypertension   Chronic obstructive pulmonary disease (HCC)   Hypothyroidism, unspecified   Depression   OSA on CPAP   Functional quadriplegia (HCC)   Sacral wound, initial encounter   Protein-calorie malnutrition, severe  Infected decubitus ulcer, buttock abscess Wound culture showed multiple organisms E. coli, Morganella, Enterococcus faecalis and Streptococcus agalactiae.  Previously treated with IV antibiotics including ceftriaxone, vancomycin and Zosyn. Started on Augmentin and ciprofloxacin on 09/18/2023.  Continue Augmentin and ciprofloxacin through 09/27/2023 S/p I&D of left buttock abscess on 09/12/2023. General surgery recommended more extensive debridement of infected decubitus ulcers but family declined.  Proceed with daily wound care per WOC recommendations      Acute on Chronic anemia Anemia of chronic disease, suspect chronic blood loss from wound Iron 24, ferritin 732, folate normal, B12 high (2,049) H&H 6.7 09/14/23 got 1 unit of blood transfusion. Hemoglobin is stable, 8.     AKI (acute kidney injury)  (HCC) Dehydration Improved Avoid nephrotoxins.       Acute metabolic encephalopathy-improved She is back to her baseline mental status Continue Aricept.     Hypertension: BP well-controlled Continue Norvasc     Hypokalemia Improved     Osteoarthritis of knees, bilateral Bedbound status secondary to osteoarthritis Advanced age and frailty/functional quadriplegia Patient is total care dependent Patient had been receiving knee injections due to chronic pain Analgesics as needed. PT OT advised long-term care. Unfortunately financial constraints are preventing placement in skilled nursing facility. TOC working on home health services Home DME ordered including low-air-loss mattress and Hoyer lift Discharge pending delivery of home DME Medically ready otherwise     Comorbidities include COPD, OSA on CPAP, depression, hypothyroidism     Medically stable for discharge.  Awaiting appropriate and safe disposition for discharge home   DVT prophylaxis: Lovenox Code Status: DNR Family Communication: Daughter via phone 1/22, 1/24 Disposition Plan: Status is: Inpatient Remains inpatient appropriate because: Unsafe discharge plan   Level of care: Telemetry Medical  Consultants:  None  Procedures:  None  Antimicrobials: Augmentin Cipro    Subjective: Seen and examined resting comfortably in bed.  Family at bedside.  No distress.  Objective: Vitals:   09/26/23 0400 09/26/23 0425 09/26/23 0751 09/26/23 1017  BP: (!) 157/58  (!) 117/51 (!) 128/51  Pulse: 62  63   Resp: 20  16   Temp: 98.1 F (36.7 C)  98 F (36.7 C)   TempSrc:      SpO2: 97%  100%   Weight:  108 kg    Height:        Intake/Output Summary (Last 24 hours) at 09/26/2023  1359 Last data filed at 09/26/2023 1200 Gross per 24 hour  Intake --  Output 500 ml  Net -500 ml   Filed Weights   09/24/23 0320 09/25/23 0438 09/26/23 0425  Weight: 112 kg 110 kg 108 kg    Examination:  General exam:  NAD.  Appears frail Respiratory system: Poor respiratory effort.  Bibasilar crackles.  Normal work breathing.  Room air Cardiovascular system: S1-S2, RRR, no murmurs, no pedal edema Gastrointestinal system: Thin, soft, nontender, nondistended, normal bowel sounds Central nervous system: Alert and oriented x 2. No focal neurological deficits. Extremities: Symmetric 5 x 5 power. Skin: Stage IV sacral Psychiatry: Judgement and insight appear impaired. Mood & affect flattened.     Data Reviewed: I have personally reviewed following labs and imaging studies  CBC: Recent Labs  Lab 09/23/23 0528 09/26/23 0858  WBC 4.1 3.4*  NEUTROABS  --  2.2  HGB 8.0* 8.4*  HCT 24.7* 26.1*  MCV 95.4 96.7  PLT 251 251   Basic Metabolic Panel: Recent Labs  Lab 09/23/23 0528 09/26/23 0858  NA 135 137  K 3.9 4.1  CL 103 106  CO2 25 24  GLUCOSE 102* 82  BUN 30* 22  CREATININE 0.87 0.84  CALCIUM 9.1 9.2  MG 2.2  --    GFR: Estimated Creatinine Clearance: 50.3 mL/min (by C-G formula based on SCr of 0.84 mg/dL). Liver Function Tests: No results for input(s): "AST", "ALT", "ALKPHOS", "BILITOT", "PROT", "ALBUMIN" in the last 168 hours. No results for input(s): "LIPASE", "AMYLASE" in the last 168 hours. No results for input(s): "AMMONIA" in the last 168 hours. Coagulation Profile: No results for input(s): "INR", "PROTIME" in the last 168 hours. Cardiac Enzymes: No results for input(s): "CKTOTAL", "CKMB", "CKMBINDEX", "TROPONINI" in the last 168 hours. BNP (last 3 results) No results for input(s): "PROBNP" in the last 8760 hours. HbA1C: No results for input(s): "HGBA1C" in the last 72 hours. CBG: No results for input(s): "GLUCAP" in the last 168 hours. Lipid Profile: No results for input(s): "CHOL", "HDL", "LDLCALC", "TRIG", "CHOLHDL", "LDLDIRECT" in the last 72 hours. Thyroid Function Tests: No results for input(s): "TSH", "T4TOTAL", "FREET4", "T3FREE", "THYROIDAB" in the last 72  hours. Anemia Panel: No results for input(s): "VITAMINB12", "FOLATE", "FERRITIN", "TIBC", "IRON", "RETICCTPCT" in the last 72 hours. Sepsis Labs: No results for input(s): "PROCALCITON", "LATICACIDVEN" in the last 168 hours.  No results found for this or any previous visit (from the past 240 hours).       Radiology Studies: No results found.      Scheduled Meds:  amLODipine  5 mg Oral Daily   amoxicillin-clavulanate  1 tablet Oral BID   vitamin C  500 mg Oral BID   ciprofloxacin  500 mg Oral BID   diclofenac Sodium  2 g Topical QID   donepezil  5 mg Oral QHS   enoxaparin (LOVENOX) injection  40 mg Subcutaneous Q24H   feeding supplement  237 mL Oral TID BM   levothyroxine  150 mcg Oral Q0600   mirabegron ER  25 mg Oral Daily   multivitamin with minerals  1 tablet Oral Daily   saccharomyces boulardii  250 mg Oral BID   senna  1 tablet Oral Daily   sertraline  50 mg Oral Daily   Continuous Infusions:   LOS: 18 days     Tresa Moore, MD [facility]   If 7PM-7AM, please contact night-coverage  09/26/2023, 1:59 PM

## 2023-09-26 NOTE — Plan of Care (Signed)

## 2023-09-26 NOTE — Plan of Care (Signed)
  Problem: Pain Management: Goal: General experience of comfort will improve Outcome: Progressing   Problem: Safety: Goal: Ability to remain free from injury will improve Outcome: Progressing   Problem: Skin Integrity: Goal: Risk for impaired skin integrity will decrease Outcome: Progressing

## 2023-09-26 NOTE — TOC Progression Note (Signed)
Transition of Care [facility]) - Progression Note    Patient Details  Name: Danielle [name] MRN: [id number] Date of Birth: Aug 31, 1929  Transition of Care [facility]) CM/SW Contact  Chapman Fitch, RN Phone Number: 09/26/2023, 5:55 PM  Clinical Narrative:    Sherron Monday with Erie Noe at [facility] [phone].  She confirms they have received the orders, but have not submitted for insurance approval.  She states that they will require additional information/forms to submit for insurance, but is also unable to provide me with details on what additional information is needed  Notified Daughter [name].  She is in agreement to pursue another DME agency, and states that she does not have a preference of agency.  Referral made to Jon with [employer] for air mattress and hoyer lift.    Daughter is aware that patient may not have home health RN at discharge She is in agreement for [facility] to check with other agencies for availability.   Messages sent to Cindi with [facility], [place] with [facility], Rolene Arbour and Elnita Maxwell with [facility] to determine if they have nursing availability    Expected Discharge Plan: Home w Home Health Services Barriers to Discharge: Other (must enter comment) (Waiting to confirm when CAP services can be restarted)  Expected Discharge Plan and Services       Living arrangements for the past 2 months: Single Family Home                                       Social Determinants of Health (SDOH) Interventions SDOH Screenings   Food Insecurity: No Food Insecurity (09/09/2023)  Housing: Low Risk  (09/09/2023)  Transportation Needs: No Transportation Needs (09/09/2023)  Utilities: Not At Risk (09/09/2023)  Alcohol Screen: Low Risk  (10/06/2018)  Depression (PHQ2-9): Low Risk  (10/06/2018)  Social Connections: Socially Isolated (09/09/2023)  Tobacco Use: Low Risk  (09/09/2023)    Readmission Risk Interventions     No data to display

## 2023-09-27 DIAGNOSIS — L089 Local infection of the skin and subcutaneous tissue, unspecified: Secondary | ICD-10-CM | POA: Diagnosis not present

## 2023-09-27 DIAGNOSIS — L8993 Pressure ulcer of unspecified site, stage 3: Secondary | ICD-10-CM | POA: Diagnosis not present

## 2023-09-27 NOTE — Progress Notes (Signed)
PROGRESS NOTE    Danielle [name]  VHQ:[id number] DOB: [birth year] DOA: 09/07/2023 PCP: Hillery Aldo, MD    Brief Narrative:    Danielle [name] is a 89 y.o. female with medical history significant for HTN, CAD, hypothyroidism, depression, COPD, OSA on CPAP, osteoarthritis, nonambulant at baseline and total care dependent with known left ischial and sacral decubitus ulcers, managed by home health nurse.  She was brought to the hospital because of general weakness of bowel 1 week duration and change in mental status ("talking out of her head") of about 2 days duration    Assessment & Plan:   Principal Problem:   Infected decubitus ulcer Active Problems:   Acute metabolic encephalopathy   AKI (acute kidney injury) (HCC)   Chronic anemia   Osteoarthritis of knees, bilateral   Bedbound   Advanced age   Coronary arteriosclerosis in native artery   Hypertension   Chronic obstructive pulmonary disease (HCC)   Hypothyroidism, unspecified   Depression   OSA on CPAP   Functional quadriplegia (HCC)   Sacral wound, initial encounter   Protein-calorie malnutrition, severe   Infected decubitus ulcer, buttock abscess Wound culture showed multiple organisms E. coli, Morganella, Enterococcus faecalis and Streptococcus agalactiae.  Previously treated with IV antibiotics including ceftriaxone, vancomycin and Zosyn. Started on Augmentin and ciprofloxacin on 09/18/2023.  S/p I&D of left buttock abscess on 09/12/2023. General surgery recommended more extensive debridement of infected decubitus ulcers but family declined.  Proceed with daily wound care per WOC recommendations Plan: Continue Augmentin and ciprofloxacin through 09/27/2023.  Last dose today   Acute on Chronic anemia Anemia of chronic disease, suspect chronic blood loss from wound Iron 24, ferritin 732, folate normal, B12 high (2,049) H&H 6.7 09/14/23 got 1 unit of blood transfusion. Hemoglobin is stable, 8.     AKI (acute kidney  injury) (HCC) Dehydration Improved Avoid nephrotoxins.       Acute metabolic encephalopathy-improved She is back to her baseline mental status Continue Aricept.     Hypertension: BP well-controlled Continue Norvasc     Hypokalemia Improved     Osteoarthritis of knees, bilateral Bedbound status secondary to osteoarthritis Advanced age and frailty/functional quadriplegia Patient is total care dependent Patient had been receiving knee injections due to chronic pain Analgesics as needed. PT OT advised long-term care. Unfortunately financial constraints are preventing placement in skilled nursing facility. TOC working on home health services Home DME ordered including low-air-loss mattress and Hoyer lift Discharge pending delivery of home DME Medically ready otherwise     Comorbidities include COPD, OSA on CPAP, depression, hypothyroidism     Medically stable for discharge.  Awaiting appropriate and safe disposition for discharge home   DVT prophylaxis: Lovenox Code Status: DNR Family Communication: Daughter via phone 1/22, 1/24 Disposition Plan: Status is: Inpatient Remains inpatient appropriate because: Unsafe discharge plan   Level of care: Telemetry Medical  Consultants:  None  Procedures:  None  Antimicrobials: Augmentin Cipro    Subjective: Seen and examined.  Resting comfortably in bed.  No visible distress.  Family at bedside.  Objective: Vitals:   09/26/23 1948 09/27/23 0354 09/27/23 0400 09/27/23 0823  BP: (!) 126/46 (!) 156/59  93/76  Pulse: 68 (!) 59  70  Resp: 16 20  20   Temp: 98 F (36.7 C) 98.3 F (36.8 C)  97.9 F (36.6 C)  TempSrc: Oral Oral    SpO2: 97% 100%  93%  Weight:   104 kg   Height:  Intake/Output Summary (Last 24 hours) at 09/27/2023 1244 Last data filed at 09/26/2023 1300 Gross per 24 hour  Intake 240 ml  Output --  Net 240 ml   Filed Weights   09/25/23 0438 09/26/23 0425 09/27/23 0400  Weight: 110 kg  108 kg 104 kg    Examination:  General exam: NAD.  Appears frail Respiratory system: Poor respiratory effort.  Bibasilar crackles.  Normal work breathing.  Room air Cardiovascular system: S1-S2, RRR, no murmurs, no pedal edema Gastrointestinal system: Thin, soft, nontender, nondistended, normal bowel sounds Central nervous system: Alert and oriented x 2. No focal neurological deficits. Extremities: Decreased power bilateral lower extremities Skin: Stage IV sacral ulcer Psychiatry: Judgement and insight appear impaired. Mood & affect flattened.     Data Reviewed: I have personally reviewed following labs and imaging studies  CBC: Recent Labs  Lab 09/23/23 0528 09/26/23 0858  WBC 4.1 3.4*  NEUTROABS  --  2.2  HGB 8.0* 8.4*  HCT 24.7* 26.1*  MCV 95.4 96.7  PLT 251 251   Basic Metabolic Panel: Recent Labs  Lab 09/23/23 0528 09/26/23 0858  NA 135 137  K 3.9 4.1  CL 103 106  CO2 25 24  GLUCOSE 102* 82  BUN 30* 22  CREATININE 0.87 0.84  CALCIUM 9.1 9.2  MG 2.2  --    GFR: Estimated Creatinine Clearance: 49.2 mL/min (by C-G formula based on SCr of 0.84 mg/dL). Liver Function Tests: No results for input(s): "AST", "ALT", "ALKPHOS", "BILITOT", "PROT", "ALBUMIN" in the last 168 hours. No results for input(s): "LIPASE", "AMYLASE" in the last 168 hours. No results for input(s): "AMMONIA" in the last 168 hours. Coagulation Profile: No results for input(s): "INR", "PROTIME" in the last 168 hours. Cardiac Enzymes: No results for input(s): "CKTOTAL", "CKMB", "CKMBINDEX", "TROPONINI" in the last 168 hours. BNP (last 3 results) No results for input(s): "PROBNP" in the last 8760 hours. HbA1C: No results for input(s): "HGBA1C" in the last 72 hours. CBG: No results for input(s): "GLUCAP" in the last 168 hours. Lipid Profile: No results for input(s): "CHOL", "HDL", "LDLCALC", "TRIG", "CHOLHDL", "LDLDIRECT" in the last 72 hours. Thyroid Function Tests: No results for input(s):  "TSH", "T4TOTAL", "FREET4", "T3FREE", "THYROIDAB" in the last 72 hours. Anemia Panel: No results for input(s): "VITAMINB12", "FOLATE", "FERRITIN", "TIBC", "IRON", "RETICCTPCT" in the last 72 hours. Sepsis Labs: No results for input(s): "PROCALCITON", "LATICACIDVEN" in the last 168 hours.  No results found for this or any previous visit (from the past 240 hours).       Radiology Studies: No results found.      Scheduled Meds:  amLODipine  5 mg Oral Daily   amoxicillin-clavulanate  1 tablet Oral BID   vitamin C  500 mg Oral BID   ciprofloxacin  500 mg Oral BID   diclofenac Sodium  2 g Topical QID   donepezil  5 mg Oral QHS   enoxaparin (LOVENOX) injection  40 mg Subcutaneous Q24H   feeding supplement  237 mL Oral TID BM   levothyroxine  150 mcg Oral Q0600   mirabegron ER  25 mg Oral Daily   multivitamin with minerals  1 tablet Oral Daily   saccharomyces boulardii  250 mg Oral BID   senna  1 tablet Oral Daily   sertraline  50 mg Oral Daily   Continuous Infusions:   LOS: 19 days     Tresa Moore, MD [facility]   If 7PM-7AM, please contact night-coverage  09/27/2023, 12:44 PM

## 2023-09-28 DIAGNOSIS — L8993 Pressure ulcer of unspecified site, stage 3: Secondary | ICD-10-CM | POA: Diagnosis not present

## 2023-09-28 DIAGNOSIS — L089 Local infection of the skin and subcutaneous tissue, unspecified: Secondary | ICD-10-CM | POA: Diagnosis not present

## 2023-09-28 NOTE — TOC Progression Note (Signed)
Transition of Care [facility]) - Progression Note    Patient Details  Name: Danielle [name] MRN: [id number] Date of Birth: 1928-12-03  Transition of Care [facility]) CM/SW Contact  Rodney Langton, RN Phone Number: 09/28/2023, 4:00 PM  Clinical Narrative:     Corinna Lines with [employer] to follow up on air mattress and hoyer, she is uanble to find the order but also states that her team is [facility].  Suggested to follow up with [employer] again tomorrow.   Expected Discharge Plan: Home w Home Health Services Barriers to Discharge: Other (must enter comment) (Waiting to confirm when CAP services can be restarted)  Expected Discharge Plan and Services       Living arrangements for the past 2 months: Single Family Home                                       Social Determinants of Health (SDOH) Interventions SDOH Screenings   Food Insecurity: No Food Insecurity (09/09/2023)  Housing: Low Risk  (09/09/2023)  Transportation Needs: No Transportation Needs (09/09/2023)  Utilities: Not At Risk (09/09/2023)  Alcohol Screen: Low Risk  (10/06/2018)  Depression (PHQ2-9): Low Risk  (10/06/2018)  Social Connections: Socially Isolated (09/09/2023)  Tobacco Use: Low Risk  (09/09/2023)    Readmission Risk Interventions     No data to display

## 2023-09-28 NOTE — Plan of Care (Signed)
  Problem: Education: Goal: Knowledge of General Education information will improve Description: Including pain rating scale, medication(s)/side effects and non-pharmacologic comfort measures Outcome: Progressing   Problem: Health Behavior/Discharge Planning: Goal: Ability to manage health-related needs will improve Outcome: Progressing   Problem: Clinical Measurements: Goal: Ability to maintain clinical measurements within normal limits will improve Outcome: Progressing Goal: Will remain free from infection Outcome: Progressing Goal: Diagnostic test results will improve Outcome: Progressing Goal: Respiratory complications will improve Outcome: Progressing Goal: Cardiovascular complication will be avoided Outcome: Progressing   Problem: Activity: Goal: Risk for activity intolerance will decrease Outcome: Progressing   Problem: Nutrition: Goal: Adequate nutrition will be maintained Outcome: Progressing   Problem: Coping: Goal: Level of anxiety will decrease Outcome: Progressing   Problem: Elimination: Goal: Will not experience complications related to bowel motility Outcome: Progressing Goal: Will not experience complications related to urinary retention Outcome: Progressing   Problem: Pain Management: Goal: General experience of comfort will improve Outcome: Progressing   Problem: Safety: Goal: Ability to remain free from injury will improve Outcome: Progressing   Problem: Skin Integrity: Goal: Risk for impaired skin integrity will decrease Outcome: Progressing   Problem: Clinical Measurements: Goal: Ability to avoid or minimize complications of infection will improve Outcome: Progressing   Problem: Skin Integrity: Goal: Skin integrity will improve Outcome: Progressing

## 2023-09-28 NOTE — Progress Notes (Signed)
PROGRESS NOTE    Danielle [name]  ZOX:[id number] DOB: [birth year] DOA: 09/07/2023 PCP: Hillery Aldo, MD    Brief Narrative:    Danielle [name] is a 89 y.o. female with medical history significant for HTN, CAD, hypothyroidism, depression, COPD, OSA on CPAP, osteoarthritis, nonambulant at baseline and total care dependent with known left ischial and sacral decubitus ulcers, managed by home health nurse.  She was brought to the hospital because of general weakness of bowel 1 week duration and change in mental status ("talking out of her head") of about 2 days duration    Assessment & Plan:   Principal Problem:   Infected decubitus ulcer Active Problems:   Acute metabolic encephalopathy   AKI (acute kidney injury) (HCC)   Chronic anemia   Osteoarthritis of knees, bilateral   Bedbound   Advanced age   Coronary arteriosclerosis in native artery   Hypertension   Chronic obstructive pulmonary disease (HCC)   Hypothyroidism, unspecified   Depression   OSA on CPAP   Functional quadriplegia (HCC)   Sacral wound, initial encounter   Protein-calorie malnutrition, severe   Infected decubitus ulcer, buttock abscess Wound culture showed multiple organisms E. coli, Morganella, Enterococcus faecalis and Streptococcus agalactiae.  Previously treated with IV antibiotics including ceftriaxone, vancomycin and Zosyn. Started on Augmentin and ciprofloxacin on 09/18/2023.  S/p I&D of left buttock abscess on 09/12/2023. General surgery recommended more extensive debridement of infected decubitus ulcers but family declined.  Proceed with daily wound care per WOC recommendations Plan: Completed course of Augmentin and ciprofloxacin.  No further antibiotics at this time.  Monitor vitals and fever curve.  Medically appropriate for discharge   Acute on Chronic anemia Anemia of chronic disease, suspect chronic blood loss from wound Iron 24, ferritin 732, folate normal, B12 high (2,049) H&H 6.7 09/14/23  got 1 unit of blood transfusion. Hemoglobin is stable, 8.     AKI (acute kidney injury) (HCC) Dehydration Improved Avoid nephrotoxins.       Acute metabolic encephalopathy-improved She is back to her baseline mental status Continue Aricept.     Hypertension: BP well-controlled Continue Norvasc     Hypokalemia Improved     Osteoarthritis of knees, bilateral Bedbound status secondary to osteoarthritis Advanced age and frailty/functional quadriplegia Patient is total care dependent Patient had been receiving knee injections due to chronic pain Analgesics as needed. PT OT advised long-term care. Unfortunately financial constraints are preventing placement in skilled nursing facility. TOC working on home health services Home DME ordered including low-air-loss mattress and Hoyer lift Discharge pending delivery of home DME Medically ready otherwise. Would be appropriate for further de-escalation of care and engagement of hospice services.  I discussed my recommendations with the patient's daughter on 1/25.  At this time would like to pursue home health services and consider outpatient hospice at a later date.     Comorbidities include COPD, OSA on CPAP, depression, hypothyroidism     Medically stable for discharge.  Awaiting appropriate and safe disposition for discharge home   DVT prophylaxis: Lovenox Code Status: DNR Family Communication: Daughter via phone 1/22, 1/24, 1/25 Disposition Plan: Status is: Inpatient Remains inpatient appropriate because: Unsafe discharge plan   Level of care: Telemetry Medical  Consultants:  None  Procedures:  None  Antimicrobials:   Subjective: Seen and examined.  Resting comfortably bed.  No visible distress.  No complaints of pain.  Objective: Vitals:   09/27/23 1445 09/27/23 2002 09/28/23 0409 09/28/23 0926  BP: (!) 148/62 (!) 137/51 Marland Kitchen)  146/49 (!) 128/48  Pulse: 60 63 (!) 56   Resp: 18 16 16    Temp: 98 F (36.7 C)  97.8 F (36.6 C) 97.6 F (36.4 C)   TempSrc:  Oral Oral   SpO2: 100% 100% 98%   Weight:      Height:        Intake/Output Summary (Last 24 hours) at 09/28/2023 1457 Last data filed at 09/28/2023 1014 Gross per 24 hour  Intake 120 ml  Output 350 ml  Net -230 ml   Filed Weights   09/25/23 0438 09/26/23 0425 09/27/23 0400  Weight: 110 kg 108 kg 104 kg    Examination:  General exam: No acute distress.  Frail-appearing Respiratory system: Lungs clear.  Normal work of breathing.  Room air Cardiovascular system: S1-S2, RRR, no murmurs, no pedal edema Gastrointestinal system: Thin, soft, nontender, nondistended, normal bowel sounds Central nervous system: Alert and oriented x 2. No focal neurological deficits. Extremities: Decreased power bilateral lower extremities Skin: Stage IV sacral ulcer Psychiatry: Judgement and insight appear impaired. Mood & affect flattened.     Data Reviewed: I have personally reviewed following labs and imaging studies  CBC: Recent Labs  Lab 09/23/23 0528 09/26/23 0858  WBC 4.1 3.4*  NEUTROABS  --  2.2  HGB 8.0* 8.4*  HCT 24.7* 26.1*  MCV 95.4 96.7  PLT 251 251   Basic Metabolic Panel: Recent Labs  Lab 09/23/23 0528 09/26/23 0858  NA 135 137  K 3.9 4.1  CL 103 106  CO2 25 24  GLUCOSE 102* 82  BUN 30* 22  CREATININE 0.87 0.84  CALCIUM 9.1 9.2  MG 2.2  --    GFR: Estimated Creatinine Clearance: 49.2 mL/min (by C-G formula based on SCr of 0.84 mg/dL). Liver Function Tests: No results for input(s): "AST", "ALT", "ALKPHOS", "BILITOT", "PROT", "ALBUMIN" in the last 168 hours. No results for input(s): "LIPASE", "AMYLASE" in the last 168 hours. No results for input(s): "AMMONIA" in the last 168 hours. Coagulation Profile: No results for input(s): "INR", "PROTIME" in the last 168 hours. Cardiac Enzymes: No results for input(s): "CKTOTAL", "CKMB", "CKMBINDEX", "TROPONINI" in the last 168 hours. BNP (last 3 results) No results for  input(s): "PROBNP" in the last 8760 hours. HbA1C: No results for input(s): "HGBA1C" in the last 72 hours. CBG: No results for input(s): "GLUCAP" in the last 168 hours. Lipid Profile: No results for input(s): "CHOL", "HDL", "LDLCALC", "TRIG", "CHOLHDL", "LDLDIRECT" in the last 72 hours. Thyroid Function Tests: No results for input(s): "TSH", "T4TOTAL", "FREET4", "T3FREE", "THYROIDAB" in the last 72 hours. Anemia Panel: No results for input(s): "VITAMINB12", "FOLATE", "FERRITIN", "TIBC", "IRON", "RETICCTPCT" in the last 72 hours. Sepsis Labs: No results for input(s): "PROCALCITON", "LATICACIDVEN" in the last 168 hours.  No results found for this or any previous visit (from the past 240 hours).       Radiology Studies: No results found.      Scheduled Meds:  amLODipine  5 mg Oral Daily   vitamin C  500 mg Oral BID   diclofenac Sodium  2 g Topical QID   donepezil  5 mg Oral QHS   enoxaparin (LOVENOX) injection  40 mg Subcutaneous Q24H   feeding supplement  237 mL Oral TID BM   levothyroxine  150 mcg Oral Q0600   mirabegron ER  25 mg Oral Daily   multivitamin with minerals  1 tablet Oral Daily   saccharomyces boulardii  250 mg Oral BID   senna  1 tablet Oral  Daily   sertraline  50 mg Oral Daily   Continuous Infusions:   LOS: 20 days     Tresa Moore, MD [facility]   If 7PM-7AM, please contact night-coverage  09/28/2023, 2:57 PM

## 2023-09-29 DIAGNOSIS — L089 Local infection of the skin and subcutaneous tissue, unspecified: Secondary | ICD-10-CM | POA: Diagnosis not present

## 2023-09-29 DIAGNOSIS — L8993 Pressure ulcer of unspecified site, stage 3: Secondary | ICD-10-CM | POA: Diagnosis not present

## 2023-09-29 NOTE — Progress Notes (Signed)
PROGRESS NOTE    Danielle [name]  ZDG:[id number] DOB: [birth year] DOA: 09/07/2023 PCP: Hillery Aldo, MD    Brief Narrative:    Danielle [name] is a 89 y.o. female with medical history significant for HTN, CAD, hypothyroidism, depression, COPD, OSA on CPAP, osteoarthritis, nonambulant at baseline and total care dependent with known left ischial and sacral decubitus ulcers, managed by home health nurse.  She was brought to the hospital because of general weakness of bowel 1 week duration and change in mental status ("talking out of her head") of about 2 days duration    Assessment & Plan:   Principal Problem:   Infected decubitus ulcer Active Problems:   Acute metabolic encephalopathy   AKI (acute kidney injury) (HCC)   Chronic anemia   Osteoarthritis of knees, bilateral   Bedbound   Advanced age   Coronary arteriosclerosis in native artery   Hypertension   Chronic obstructive pulmonary disease (HCC)   Hypothyroidism, unspecified   Depression   OSA on CPAP   Functional quadriplegia (HCC)   Sacral wound, initial encounter   Protein-calorie malnutrition, severe   Infected decubitus ulcer, buttock abscess Wound culture showed multiple organisms E. coli, Morganella, Enterococcus faecalis and Streptococcus agalactiae.  Previously treated with IV antibiotics including ceftriaxone, vancomycin and Zosyn. Started on Augmentin and ciprofloxacin on 09/18/2023.  S/p I&D of left buttock abscess on 09/12/2023. General surgery recommended more extensive debridement of infected decubitus ulcers but family declined.  Proceed with daily wound care per WOC recommendations Plan: Completed course of Augmentin and ciprofloxacin.   No further antibiotics at this time.   Monitor vitals and fever curve.   Medically appropriate for discharge Low-air-loss mattress and Hoyer lift have been ordered.  Home health services pending.  Is my medical opinion that the patient would benefit from further  de-escalation of care and engagement of hospice services.  I discussed these recommendations with the patient's daughter at length.  At this time they would not like to engage hospice services and would like to proceed with home health.   Acute on Chronic anemia Anemia of chronic disease, suspect chronic blood loss from wound Iron 24, ferritin 732, folate normal, B12 high (2,049) H&H 6.7 09/14/23 got 1 unit of blood transfusion. Hemoglobin is stable, 8.     AKI (acute kidney injury) (HCC) Dehydration Improved Avoid nephrotoxins.       Acute metabolic encephalopathy-improved She is back to her baseline mental status Continue Aricept.     Hypertension: BP well-controlled Continue Norvasc     Hypokalemia Improved     Osteoarthritis of knees, bilateral Bedbound status secondary to osteoarthritis Advanced age and frailty/functional quadriplegia Patient is total care dependent Patient had been receiving knee injections due to chronic pain Analgesics as needed. PT OT advised long-term care. Unfortunately financial constraints are preventing placement in skilled nursing facility. TOC working on home health services Home DME ordered including low-air-loss mattress and Hoyer lift Discharge pending delivery of home DME Medically ready otherwise. Would be appropriate for further de-escalation of care and engagement of hospice services.  I discussed my recommendations with the patient's daughter on 1/25.  At this time would like to pursue home health services and consider outpatient hospice at a later date.     Comorbidities include COPD, OSA on CPAP, depression, hypothyroidism     Medically stable for discharge.  Awaiting appropriate and safe disposition for discharge home   DVT prophylaxis: Lovenox Code Status: DNR Family Communication: Daughter via phone 1/22, 1/24, 1/25.  Left VM on 1/27 Disposition Plan: Status is: Inpatient Remains inpatient appropriate because: Unsafe  discharge plan   Level of care: Telemetry Medical  Consultants:  None  Procedures:  None  Antimicrobials:   Subjective: Seen and examined.  Resting comfortably in bed.  No visible distress.  No complaints of pain.  Send at bedside.  Objective: Vitals:   09/28/23 1711 09/28/23 1943 09/29/23 0450 09/29/23 0757  BP: (!) 129/52 138/60 (!) 151/52 (!) 148/57  Pulse: 63 62 65 61  Resp: 16 20 18 18   Temp:  98.1 F (36.7 C) 98 F (36.7 C) 97.7 F (36.5 C)  TempSrc:  Oral Oral Oral  SpO2: 99% 99% 100% 100%  Weight:      Height:        Intake/Output Summary (Last 24 hours) at 09/29/2023 1316 Last data filed at 09/29/2023 0455 Gross per 24 hour  Intake 220 ml  Output 500 ml  Net -280 ml   Filed Weights   09/25/23 0438 09/26/23 0425 09/27/23 0400  Weight: 110 kg 108 kg 104 kg    Examination:  General exam: NAD.  Appears frail Respiratory system: Lungs clear.  Normal work of breathing.  Room air Cardiovascular system: S1-S2, RRR, no murmurs, no pedal edema Gastrointestinal system: Thin, soft, nontender, nondistended, normal bowel sounds Central nervous system: Alert and oriented x 2. No focal neurological deficits. Extremities: Decreased power bilateral lower extremities Skin: Stage IV sacral ulcer Psychiatry: Judgement and insight appear impaired. Mood & affect flattened.     Data Reviewed: I have personally reviewed following labs and imaging studies  CBC: Recent Labs  Lab 09/23/23 0528 09/26/23 0858  WBC 4.1 3.4*  NEUTROABS  --  2.2  HGB 8.0* 8.4*  HCT 24.7* 26.1*  MCV 95.4 96.7  PLT 251 251   Basic Metabolic Panel: Recent Labs  Lab 09/23/23 0528 09/26/23 0858  NA 135 137  K 3.9 4.1  CL 103 106  CO2 25 24  GLUCOSE 102* 82  BUN 30* 22  CREATININE 0.87 0.84  CALCIUM 9.1 9.2  MG 2.2  --    GFR: Estimated Creatinine Clearance: 49.2 mL/min (by C-G formula based on SCr of 0.84 mg/dL). Liver Function Tests: No results for input(s): "AST", "ALT",  "ALKPHOS", "BILITOT", "PROT", "ALBUMIN" in the last 168 hours. No results for input(s): "LIPASE", "AMYLASE" in the last 168 hours. No results for input(s): "AMMONIA" in the last 168 hours. Coagulation Profile: No results for input(s): "INR", "PROTIME" in the last 168 hours. Cardiac Enzymes: No results for input(s): "CKTOTAL", "CKMB", "CKMBINDEX", "TROPONINI" in the last 168 hours. BNP (last 3 results) No results for input(s): "PROBNP" in the last 8760 hours. HbA1C: No results for input(s): "HGBA1C" in the last 72 hours. CBG: No results for input(s): "GLUCAP" in the last 168 hours. Lipid Profile: No results for input(s): "CHOL", "HDL", "LDLCALC", "TRIG", "CHOLHDL", "LDLDIRECT" in the last 72 hours. Thyroid Function Tests: No results for input(s): "TSH", "T4TOTAL", "FREET4", "T3FREE", "THYROIDAB" in the last 72 hours. Anemia Panel: No results for input(s): "VITAMINB12", "FOLATE", "FERRITIN", "TIBC", "IRON", "RETICCTPCT" in the last 72 hours. Sepsis Labs: No results for input(s): "PROCALCITON", "LATICACIDVEN" in the last 168 hours.  No results found for this or any previous visit (from the past 240 hours).       Radiology Studies: No results found.      Scheduled Meds:  amLODipine  5 mg Oral Daily   vitamin C  500 mg Oral BID   diclofenac Sodium  2  g Topical QID   donepezil  5 mg Oral QHS   enoxaparin (LOVENOX) injection  40 mg Subcutaneous Q24H   feeding supplement  237 mL Oral TID BM   levothyroxine  150 mcg Oral Q0600   mirabegron ER  25 mg Oral Daily   multivitamin with minerals  1 tablet Oral Daily   saccharomyces boulardii  250 mg Oral BID   senna  1 tablet Oral Daily   sertraline  50 mg Oral Daily   Continuous Infusions:   LOS: 21 days     Tresa Moore, MD [facility]   If 7PM-7AM, please contact night-coverage  09/29/2023, 1:16 PM

## 2023-09-29 NOTE — TOC Progression Note (Signed)
Transition of Care [facility]) - Progression Note    Patient Details  Name: Danielle [name] MRN: [id number] Date of Birth: July 25, 1929  [facility] CM/SW Contact  Chapman Fitch, RN Phone Number: 09/29/2023, 4:52 PM  Clinical Narrative:     [facility], [facility], [facility], [facility], and [facility] unable to accept referral Per Elnita Maxwell with [facility] they can potentially accept referral she is to review  Per Jon with [facility].  Air mattress and hoyer lift to be delivered today    Expected Discharge Plan: Home w Home Health Services Barriers to Discharge: Other (must enter comment) (Waiting to confirm when CAP services can be restarted)  Expected Discharge Plan and Services       Living arrangements for the past 2 months: Single Family Home                                       Social Determinants of Health (SDOH) Interventions SDOH Screenings   Food Insecurity: No Food Insecurity (09/09/2023)  Housing: Low Risk  (09/09/2023)  Transportation Needs: No Transportation Needs (09/09/2023)  Utilities: Not At Risk (09/09/2023)  Alcohol Screen: Low Risk  (10/06/2018)  Depression (PHQ2-9): Low Risk  (10/06/2018)  Social Connections: Socially Isolated (09/09/2023)  Tobacco Use: Low Risk  (09/09/2023)    Readmission Risk Interventions     No data to display

## 2023-09-30 DIAGNOSIS — L089 Local infection of the skin and subcutaneous tissue, unspecified: Secondary | ICD-10-CM | POA: Diagnosis not present

## 2023-09-30 DIAGNOSIS — L8993 Pressure ulcer of unspecified site, stage 3: Secondary | ICD-10-CM | POA: Diagnosis not present

## 2023-09-30 MED ORDER — TRAMADOL HCL 50 MG PO TABS: 50.0000 mg | ORAL_TABLET | Freq: Three times a day (TID) | ORAL | 0 refills | Status: DC | PRN

## 2023-09-30 MED ORDER — ASCORBIC ACID 500 MG PO TABS: 500.0000 mg | ORAL_TABLET | Freq: Every day | ORAL | 0 refills | Status: DC

## 2023-09-30 NOTE — Discharge Summary (Signed)
Physician Discharge Summary  Danielle [name] ZOX:[id number] DOB: Jan 04, 1929 DOA: 09/07/2023  PCP: Hillery Aldo, MD  Admit date: 09/07/2023 Discharge date: 09/30/2023  Admitted From: Home  Disposition:  Home with home health  Recommendations for Outpatient Follow-up:  Follow up with PCP in 1-2 weeks Consider referral to outpatient wound center  Home Health:Yes PT OT aide RN  Equipment/Devices:Hospital mattress, hoyer lift   Discharge Condition:Stable  CODE STATUS:DNR  Diet recommendation: Reg  Brief/Interim Summary:    Danielle [name] is a 89 y.o. female with medical history significant for HTN, CAD, hypothyroidism, depression, COPD, OSA on CPAP, osteoarthritis, nonambulant at baseline and total care dependent with known left ischial and sacral decubitus ulcers, managed by home health nurse.  She was brought to the hospital because of general weakness of bowel 1 week duration and change in mental status ("talking out of her head") of about 2 days duration   Discharge Diagnoses:  Principal Problem:   Infected decubitus ulcer Active Problems:   Acute metabolic encephalopathy   AKI (acute kidney injury) (HCC)   Chronic anemia   Osteoarthritis of knees, bilateral   Bedbound   Advanced age   Coronary arteriosclerosis in native artery   Hypertension   Chronic obstructive pulmonary disease (HCC)   Hypothyroidism, unspecified   Depression   OSA on CPAP   Functional quadriplegia (HCC)   Sacral wound, initial encounter   Protein-calorie malnutrition, severe  Infected decubitus ulcer, buttock abscess Wound culture showed multiple organisms E. coli, Morganella, Enterococcus faecalis and Streptococcus agalactiae.  Previously treated with IV antibiotics including ceftriaxone, vancomycin and Zosyn. Started on Augmentin and ciprofloxacin on 09/18/2023.  S/p I&D of left buttock abscess on 09/12/2023. General surgery recommended more extensive debridement of infected decubitus ulcers but  family declined.  Proceed with daily wound care per WOC recommendations Plan: Completed course of Augmentin and ciprofloxacin.   No further antibiotics at this time.   Medically appropriate for discharge Low-air-loss mattress and Hoyer lift have been ordered.  Home health services ordered.  Is my medical opinion that the patient would benefit from further de-escalation of care and engagement of hospice services.  I discussed these recommendations with the patient's daughter at length.  At this time they would not like to engage hospice services and would like to proceed with home health.     Acute on Chronic anemia Anemia of chronic disease, suspect chronic blood loss from wound Iron 24, ferritin 732, folate normal, B12 high (2,049) H&H 6.7 09/14/23 got 1 unit of blood transfusion. Hemoglobin is stable, 8.     AKI (acute kidney injury) (HCC) Dehydration Improved Avoid nephrotoxins.       Acute metabolic encephalopathy-improved She is back to her baseline mental status Continue Aricept.     Hypertension: BP well-controlled Resume Imdur     Hypokalemia Improved     Osteoarthritis of knees, bilateral Bedbound status secondary to osteoarthritis Advanced age and frailty/functional quadriplegia Patient is total care dependent Patient had been receiving knee injections due to chronic pain Analgesics as needed. PT OT advised long-term care. Unfortunately financial constraints are preventing placement in skilled nursing facility. Home DME ordered including low-air-loss mattress and Hoyer lift Would be appropriate for further de-escalation of care and engagement of hospice services.  I discussed my recommendations with the patient's daughter on 1/25.  At this time would like to pursue home health services and consider outpatient hospice at a later date.     Comorbidities include COPD, OSA on CPAP, depression, hypothyroidism  Medically stable for discharge.    Discharge  Instructions  Discharge Instructions     Diet - low sodium heart healthy   Complete by: As directed    Discharge wound care:   Complete by: As directed    Wound care  Daily      Comments: Apply moist gauze packing to left buttock wound q day, using swab to fill, then cover with ABD pad and tape   Increase activity slowly   Complete by: As directed       Allergies as of 09/30/2023       Reactions   Ace Inhibitors Other (See Comments)   Angiodema to Ace-inhibitor 2013        Medication List     STOP taking these medications    senna 8.6 MG Tabs tablet Commonly known as: SENOKOT       TAKE these medications    acetaminophen 500 MG tablet Commonly known as: TYLENOL 1,000 mg every 8 (eight) hours as needed for moderate pain.   alum & mag hydroxide-simeth 400-400-40 MG/5ML suspension Commonly known as: MAALOX PLUS Take 15 mLs by mouth as needed for indigestion.   ascorbic acid 500 MG tablet Commonly known as: VITAMIN C Take 1 tablet (500 mg total) by mouth daily.   aspirin EC 81 MG tablet Take 1 tablet by mouth daily.   celecoxib 200 MG capsule Commonly known as: CELEBREX Take 200 mg by mouth daily.   diphenhydrAMINE 25 mg capsule Commonly known as: BENADRYL Take 25 mg by mouth at bedtime as needed for sleep.   isosorbide mononitrate 60 MG 24 hr tablet Commonly known as: IMDUR Take 60 mg by mouth daily.   levothyroxine 150 MCG tablet Commonly known as: SYNTHROID Take 150 mcg by mouth daily.   Myrbetriq 25 MG Tb24 tablet Generic drug: mirabegron ER Take 25 mg by mouth daily.   naproxen sodium 220 MG tablet Commonly known as: ALEVE Take 220 mg by mouth 2 (two) times daily as needed (pain).   nitroGLYCERIN 0.4 MG SL tablet Commonly known as: NITROSTAT Place 0.4 mg under the tongue every 5 (five) minutes as needed for chest pain.   potassium chloride 10 MEQ tablet Commonly known as: KLOR-CON M Take 10 mEq by mouth 2 (two) times daily.    sertraline 50 MG tablet Commonly known as: ZOLOFT Take 50 mg by mouth daily.   traMADol 50 MG tablet Commonly known as: ULTRAM Take 1 tablet (50 mg total) by mouth every 8 (eight) hours as needed for moderate pain (pain score 4-6).   Voltaren 1 % Gel Generic drug: diclofenac Sodium Apply 2 g topically 4 (four) times daily.               Durable Medical Equipment  (From admission, onward)           Start     Ordered   09/26/23 1505  For home use only DME Air overlay mattress  Once       Comments: Low air loss mattress for chronic stage 3 pressure ulcer with extensive tunneling, Patient also suffers from incontinence, and inability to reposition frequently   09/26/23 1505   09/24/23 1445  For home use only DME Other see comment  Once       Comments: Michiel Sites lift  Question:  Length of Need  Answer:  Lifetime   09/24/23 1444              Discharge Care Instructions  (From admission,  onward)           Start     Ordered   09/30/23 0000  Discharge wound care:       Comments: Wound care  Daily      Comments: Apply moist gauze packing to left buttock wound q day, using swab to fill, then cover with ABD pad and tape   09/30/23 1007            Allergies  Allergen Reactions   Ace Inhibitors Other (See Comments)    Angiodema to Ace-inhibitor 2013    Consultations: Surgery Palliative care   Procedures/Studies: CT Head Wo Contrast Result Date: 09/08/2023 CLINICAL DATA:  Mental status change EXAM: CT HEAD WITHOUT CONTRAST TECHNIQUE: Contiguous axial images were obtained from the base of the skull through the vertex without intravenous contrast. RADIATION DOSE REDUCTION: This exam was performed according to the departmental dose-optimization program which includes automated exposure control, adjustment of the mA and/or kV according to patient size and/or use of iterative reconstruction technique. COMPARISON:  CT 02/10/2021 FINDINGS: Brain: No intracranial  hemorrhage, mass effect, or evidence of acute infarct. No hydrocephalus. Age-commensurate cerebral atrophy and chronic small vessel ischemic disease. Chronic right frontal and parietal infarcts. Unchanged small bilateral subdural hygromas Vascular: No hyperdense vessel. Intracranial arterial calcification. Skull: No fracture or focal lesion. Sinuses/Orbits: No acute finding. Other: None. IMPRESSION: 1. No acute intracranial abnormality. Electronically Signed   By: Minerva Fester M.D.   On: 09/08/2023 00:16   DG Chest Port 1 View Result Date: 09/07/2023 CLINICAL DATA:  Weakness, malaise.  Questionable sepsis. EXAM: PORTABLE CHEST 1 VIEW COMPARISON:  02/04/2021 FINDINGS: Mild cardiomegaly. Blunting of the right costophrenic angle could reflect small effusion or pleural scarring. No confluent airspace opacities, no confluent opacities or edema. IMPRESSION: Blunting of the right costophrenic angle could reflect small right effusion or pleural thickening/scarring. Cardiomegaly. Electronically Signed   By: Charlett Nose M.D.   On: 09/07/2023 23:32      Subjective: Seen and examined on day of DC.  Stable, appropriate for dispo today.  Discharge Exam: Vitals:   09/30/23 0327 09/30/23 0820  BP: (!) 135/44 (!) 129/47  Pulse: 61 (!) 58  Resp: 19 18  Temp: 97.7 F (36.5 C) 98.4 F (36.9 C)  SpO2: 99% 98%   Vitals:   09/29/23 2003 09/30/23 0327 09/30/23 0328 09/30/23 0820  BP: (!) 140/60 (!) 135/44  (!) 129/47  Pulse: 63 61  (!) 58  Resp: 18 19  18   Temp: 97.8 F (36.6 C) 97.7 F (36.5 C)  98.4 F (36.9 C)  TempSrc:  Oral  Oral  SpO2: 100% 99%  98%  Weight:   110 kg   Height:        General: Pt is alert, awake, not in acute distress Cardiovascular: RRR, S1/S2 +, no rubs, no gallops Respiratory: CTA bilaterally, no wheezing, no rhonchi Abdominal: Soft, NT, ND, bowel sounds + Extremities: no edema, no cyanosis    The results of significant diagnostics from this hospitalization (including  imaging, microbiology, ancillary and laboratory) are listed below for reference.     Microbiology: No results found for this or any previous visit (from the past 240 hours).   Labs: BNP (last 3 results) No results for input(s): "BNP" in the last 8760 hours. Basic Metabolic Panel: Recent Labs  Lab 09/26/23 0858  NA 137  K 4.1  CL 106  CO2 24  GLUCOSE 82  BUN 22  CREATININE 0.84  CALCIUM 9.2  Liver Function Tests: No results for input(s): "AST", "ALT", "ALKPHOS", "BILITOT", "PROT", "ALBUMIN" in the last 168 hours. No results for input(s): "LIPASE", "AMYLASE" in the last 168 hours. No results for input(s): "AMMONIA" in the last 168 hours. CBC: Recent Labs  Lab 09/26/23 0858  WBC 3.4*  NEUTROABS 2.2  HGB 8.4*  HCT 26.1*  MCV 96.7  PLT 251   Cardiac Enzymes: No results for input(s): "CKTOTAL", "CKMB", "CKMBINDEX", "TROPONINI" in the last 168 hours. BNP: Invalid input(s): "POCBNP" CBG: No results for input(s): "GLUCAP" in the last 168 hours. D-Dimer No results for input(s): "DDIMER" in the last 72 hours. Hgb A1c No results for input(s): "HGBA1C" in the last 72 hours. Lipid Profile No results for input(s): "CHOL", "HDL", "LDLCALC", "TRIG", "CHOLHDL", "LDLDIRECT" in the last 72 hours. Thyroid function studies No results for input(s): "TSH", "T4TOTAL", "T3FREE", "THYROIDAB" in the last 72 hours.  Invalid input(s): "FREET3" Anemia work up No results for input(s): "VITAMINB12", "FOLATE", "FERRITIN", "TIBC", "IRON", "RETICCTPCT" in the last 72 hours. Urinalysis    Component Value Date/Time   COLORURINE YELLOW (A) 09/07/2023 2228   APPEARANCEUR HAZY (A) 09/07/2023 2228   APPEARANCEUR Clear 07/31/2020 1517   LABSPEC 1.018 09/07/2023 2228   LABSPEC 1.023 08/09/2012 1758   PHURINE 5.0 09/07/2023 2228   GLUCOSEU NEGATIVE 09/07/2023 2228   GLUCOSEU Negative 08/09/2012 1758   HGBUR NEGATIVE 09/07/2023 2228   BILIRUBINUR NEGATIVE 09/07/2023 2228   BILIRUBINUR Negative  07/31/2020 1517   BILIRUBINUR Negative 08/09/2012 1758   KETONESUR 5 (A) 09/07/2023 2228   PROTEINUR NEGATIVE 09/07/2023 2228   NITRITE NEGATIVE 09/07/2023 2228   LEUKOCYTESUR NEGATIVE 09/07/2023 2228   LEUKOCYTESUR Trace 08/09/2012 1758   Sepsis Labs Recent Labs  Lab 09/26/23 0858  WBC 3.4*   Microbiology No results found for this or any previous visit (from the past 240 hours).   Time coordinating discharge: Over 30 minutes  SIGNED:   Tresa Moore, MD  [facility] 09/30/2023, 10:07 AM Pager   If 7PM-7AM, please contact night-coverage

## 2023-09-30 NOTE — Plan of Care (Signed)
  Problem: Education: Goal: Knowledge of General Education information will improve Description: Including pain rating scale, medication(s)/side effects and non-pharmacologic comfort measures Outcome: Progressing   Problem: Health Behavior/Discharge Planning: Goal: Ability to manage health-related needs will improve Outcome: Progressing   Problem: Clinical Measurements: Goal: Ability to maintain clinical measurements within normal limits will improve Outcome: Progressing Goal: Will remain free from infection Outcome: Progressing Goal: Diagnostic test results will improve Outcome: Progressing Goal: Respiratory complications will improve Outcome: Progressing Goal: Cardiovascular complication will be avoided Outcome: Progressing   Problem: Activity: Goal: Risk for activity intolerance will decrease Outcome: Progressing   Problem: Nutrition: Goal: Adequate nutrition will be maintained Outcome: Progressing   Problem: Coping: Goal: Level of anxiety will decrease Outcome: Progressing   Problem: Elimination: Goal: Will not experience complications related to bowel motility Outcome: Progressing Goal: Will not experience complications related to urinary retention Outcome: Progressing   Problem: Pain Management: Goal: General experience of comfort will improve Outcome: Progressing   Problem: Safety: Goal: Ability to remain free from injury will improve Outcome: Progressing   Problem: Skin Integrity: Goal: Risk for impaired skin integrity will decrease Outcome: Progressing   Problem: Clinical Measurements: Goal: Ability to avoid or minimize complications of infection will improve Outcome: Progressing   Problem: Skin Integrity: Goal: Skin integrity will improve Outcome: Progressing

## 2023-09-30 NOTE — TOC Transition Note (Addendum)
Transition of Care [facility]) - Discharge Note   Patient Details  Name: Danielle [name] MRN: [id number] Date of Birth: November 15, 1928  [facility] CM/SW Contact:  Chapman Fitch, RN Phone Number: 09/30/2023, 10:41 AM   Clinical Narrative:     Spoke with daughter [name] who confirms that air mattress has been delivered and installed, along with hoyer lift  Daughter confirms that she wants to proceed with home health services through [facility].  Elnita Maxwell with [facility] confirms they can accept  Daughter to call patients CAPS worker that she has 50 days a week and reinstate services  Daughter states that she will be at the patients home to accept her by EMS at 2pm.  EMS transport called for 2  EMS packet and signed DNR on chart  Final next level of care: Home w Home Health Services Barriers to Discharge: Other (must enter comment) (Waiting to confirm when CAP services can be restarted)   Patient Goals and CMS Choice            Discharge Placement                       Discharge Plan and Services Additional resources added to the After Visit Summary for                                       Social Drivers of Health (SDOH) Interventions SDOH Screenings   Food Insecurity: No Food Insecurity (09/09/2023)  Housing: Low Risk  (09/09/2023)  Transportation Needs: No Transportation Needs (09/09/2023)  Utilities: Not At Risk (09/09/2023)  Alcohol Screen: Low Risk  (10/06/2018)  Depression (PHQ2-9): Low Risk  (10/06/2018)  Social Connections: Socially Isolated (09/09/2023)  Tobacco Use: Low Risk  (09/09/2023)     Readmission Risk Interventions     No data to display

## 2023-10-18 ENCOUNTER — Emergency Department: Payer: 59

## 2023-10-18 ENCOUNTER — Emergency Department
Admission: EM | Admit: 2023-10-18 | Discharge: 2023-10-18 | Disposition: A | Discharge status: Home / Self Care | Payer: 59 | Attending: Emergency Medicine | Admitting: Emergency Medicine

## 2023-10-18 DIAGNOSIS — S31000A Unspecified open wound of lower back and pelvis without penetration into retroperitoneum, initial encounter: Secondary | ICD-10-CM | POA: Diagnosis not present

## 2023-10-18 DIAGNOSIS — R911 Solitary pulmonary nodule: Secondary | ICD-10-CM | POA: Insufficient documentation

## 2023-10-18 DIAGNOSIS — S3992XA Unspecified injury of lower back, initial encounter: Secondary | ICD-10-CM | POA: Diagnosis present

## 2023-10-18 DIAGNOSIS — E039 Hypothyroidism, unspecified: Secondary | ICD-10-CM | POA: Diagnosis not present

## 2023-10-18 DIAGNOSIS — D649 Anemia, unspecified: Secondary | ICD-10-CM | POA: Insufficient documentation

## 2023-10-18 DIAGNOSIS — I10 Essential (primary) hypertension: Secondary | ICD-10-CM | POA: Diagnosis not present

## 2023-10-18 DIAGNOSIS — D72829 Elevated white blood cell count, unspecified: Secondary | ICD-10-CM | POA: Insufficient documentation

## 2023-10-18 DIAGNOSIS — X58XXXA Exposure to other specified factors, initial encounter: Secondary | ICD-10-CM | POA: Insufficient documentation

## 2023-10-18 DIAGNOSIS — K5900 Constipation, unspecified: Secondary | ICD-10-CM | POA: Diagnosis not present

## 2023-10-18 DIAGNOSIS — I2510 Atherosclerotic heart disease of native coronary artery without angina pectoris: Secondary | ICD-10-CM | POA: Diagnosis not present

## 2023-10-18 DIAGNOSIS — J449 Chronic obstructive pulmonary disease, unspecified: Secondary | ICD-10-CM | POA: Diagnosis not present

## 2023-10-18 LAB — COMPREHENSIVE METABOLIC PANEL
ALT: 16 U/L (ref 0–44)
AST: 23 U/L (ref 15–41)
Albumin: 2 g/dL — ABNORMAL LOW (ref 3.5–5.0)
Alkaline Phosphatase: 75 U/L (ref 38–126)
Anion gap: 9 (ref 5–15)
BUN: 30 mg/dL — ABNORMAL HIGH (ref 8–23)
CO2: 23 mmol/L (ref 22–32)
Calcium: 9.3 mg/dL (ref 8.9–10.3)
Chloride: 104 mmol/L (ref 98–111)
Creatinine, Ser: 1.41 mg/dL — ABNORMAL HIGH (ref 0.44–1.00)
GFR, Estimated: 35 mL/min — ABNORMAL LOW (ref >60)
Glucose, Bld: 118 mg/dL — ABNORMAL HIGH (ref 70–99)
Potassium: 4.6 mmol/L (ref 3.5–5.1)
Sodium: 136 mmol/L (ref 135–145)
Total Bilirubin: 0.6 mg/dL (ref 0.0–1.2)
Total Protein: 6.9 g/dL (ref 6.5–8.1)

## 2023-10-18 LAB — CBC
HCT: 23.9 % — ABNORMAL LOW (ref 36.0–46.0)
Hemoglobin: 7.5 g/dL — ABNORMAL LOW (ref 12.0–15.0)
MCH: 30.1 pg (ref 26.0–34.0)
MCHC: 31.4 g/dL (ref 30.0–36.0)
MCV: 96 fL (ref 80.0–100.0)
Platelets: 212 10*3/uL (ref 150–400)
RBC: 2.49 MIL/uL — ABNORMAL LOW (ref 3.87–5.11)
RDW: 18.6 % — ABNORMAL HIGH (ref 11.5–15.5)
WBC: 13.4 10*3/uL — ABNORMAL HIGH (ref 4.0–10.5)
nRBC: 0 % (ref 0.0–0.2)

## 2023-10-18 LAB — TYPE AND SCREEN
ABO/RH(D): O POS
Antibody Screen: POSITIVE
DAT, IgG: POSITIVE
DAT, complement: POSITIVE

## 2023-10-18 MED ORDER — CEPHALEXIN 500 MG PO CAPS
500.0000 mg | ORAL_CAPSULE | Freq: Four times a day (QID) | ORAL | 0 refills | Status: AC
Start: 2023-10-18 — End: 2023-10-25

## 2023-10-18 MED ORDER — SODIUM CHLORIDE 0.9 % IV BOLUS
1000.0000 mL | Freq: Once | INTRAVENOUS | Status: AC
  Administered 2023-10-18: 1000 mL via INTRAVENOUS

## 2023-10-18 MED ORDER — IOHEXOL 300 MG/ML  SOLN
75.0000 mL | Freq: Once | INTRAMUSCULAR | Status: AC | PRN
  Administered 2023-10-18: 75 mL via INTRAVENOUS

## 2023-10-18 NOTE — ED Triage Notes (Signed)
ARrives via [facility] from home.  Had blood drawn yesterday from home, just received results that HGB was 6.  VS wnl.

## 2023-10-18 NOTE — ED Notes (Signed)
EMS called to transport patient home

## 2023-10-18 NOTE — ED Provider Notes (Signed)
[facility] Provider Note    Event Date/Time   First MD Initiated Contact with Patient 10/18/23 1518     (approximate)   History   abnormal labs   HPI  Danielle [name] is a 89 year old female with history of HTN, CAD, hypothyroidism, COPD, anemia of chronic disease presenting to the emergency department for evaluation of abnormal outpatient labs.  Daughter reports ongoing issues with pressure sores. More recently has had ongoing bloody discharge with clots from her gluteal area. Was told she recently had a hemoglobin of 6 and was directed to the ER. Daughter additionally states they are planning initiate hospice.   Upon review of outside records and nurse triage note notes that the patient has wound draining bright red blood with critical hemoglobin reported.  I am unable to see these labs in care everywhere.     Physical Exam   Triage Vital Signs: ED Triage Vitals [10/18/23 1354]  Encounter Vitals Group     BP (!) 139/45     Systolic BP Percentile      Diastolic BP Percentile      Pulse Rate 69     Resp 17     Temp 98.8 F (37.1 C)     Temp src      SpO2 97 %     Weight      Height      Head Circumference      Peak Flow      Pain Score      Pain Loc      Pain Education      Exclude from Growth Chart     Most recent vital signs: Vitals:   10/18/23 1830 10/18/23 1930  BP:  (!) 119/51  Pulse:  81  Resp:  20  Temp: 99.3 F (37.4 C)   SpO2:  100%     General: Awake, interactive but soft spoken with difficult to understand speech CV:  Regular rate, good peripheral perfusion.  Resp:  Unlabored respirations, lungs clear to auscultation Abd:  Nondistended, soft, nontender, multiple pressure wounds over the bilateral gluteal area.  Over the left gluteus there is an area with expressible bloody drainage without surrounding skin breakdown, see image below Neuro:  No gross facial asymmetry, moving extremity spontaneously     ED Results /  Procedures / Treatments   Labs (all labs ordered are listed, but only abnormal results are displayed) Labs Reviewed  CBC - Abnormal; Notable for the following components:      Result Value   WBC 13.4 (*)    RBC 2.49 (*)    Hemoglobin 7.5 (*)    HCT 23.9 (*)    RDW 18.6 (*)    All other components within normal limits  COMPREHENSIVE METABOLIC PANEL - Abnormal; Notable for the following components:   Glucose, Bld 118 (*)    BUN 30 (*)    Creatinine, Ser 1.41 (*)    Albumin 2.0 (*)    GFR, Estimated 35 (*)    All other components within normal limits  TYPE AND SCREEN     EKG EKG independently reviewed interpreted by myself (ER attending) demonstrates:    RADIOLOGY Imaging independently reviewed and interpreted by myself demonstrates:  CT abdomen pelvis demonstrates constipation with rectal wall without evidence of stercoral colitis, redemonstrates pulmonary nodules, discussed with daughter  PROCEDURES:  Critical Care performed: No  .Fecal disimpaction  Date/Time: 10/18/2023 11:48 PM  Performed by: Trinna Post, MD Authorized by: Rosalia Hammers,  Danie Binder, MD  Consent: Verbal consent obtained. Risks and benefits: risks, benefits and alternatives were discussed Consent given by: daughter. Time out: Immediately prior to procedure a "time out" was called to verify the correct patient, procedure, equipment, support staff and site/side marked as required. Patient tolerance: patient tolerated the procedure well with no immediate complications Comments: Using lubricated glove, stool was removed from the patient's rectal vault.  Soapsuds enema was placed immediately following with moderate return of stool.      MEDICATIONS ORDERED IN ED: Medications  sodium chloride 0.9 % bolus 1,000 mL (0 mLs Intravenous Stopped 10/18/23 2155)  iohexol (OMNIPAQUE) 300 MG/ML solution 75 mL (75 mLs Intravenous Contrast Given 10/18/23 2142)     IMPRESSION / MDM / ASSESSMENT AND PLAN / ED COURSE  I reviewed  the triage vital signs and the nursing notes.  Differential diagnosis includes, but is not limited to, acute blood loss anemia, hematoma, abscess, fistula, other acute intra-abdominal process  Patient's presentation is most consistent with acute presentation with potential threat to life or bodily function.  89 year old female presenting to the emergency department for evaluation of anemia and sacral wounds.  Stable vitals.  Labs notable for leukocytosis WC of 13.4, anemia with hemoglobin of 7.5.  CMP with AKI with creatinine of 1.41.  Does have draining wound on sacral region.  CT ordered to further evaluate.  Notify by RN that patient does have warm antibodies on her type and screen, we do not have the appropriate blood products at our hospital and these will need to be sent from [facility].  With hemoglobin above 7, stable vitals, do not feel that patient requires acute blood transfusion currently.  I did discuss daughters report of plan to transition to hospice care.  She would like ongoing medical care currently.  Significant difficulty with obtaining IV.  IV team eventually obtained IV after multiple attempts.  This unfortunately did extravasate in CT, so CT was changed to noncontrast scan.  This demonstrated diffuse subcutaneous edema, but no visible abscess, fistula tract.  Has leukocytosis here and AKI here.  I discussed further management including IV antibiotics, IV fluids but this would require further IV access attempts.  After shared decision making, daughter did wish to hold off on further IV attempts and instead trial oral antibiotics with strict return precautions.  CT also noted rectal ball, manual disimpaction with soapsuds enema performed as above.      FINAL CLINICAL IMPRESSION(S) / ED DIAGNOSES   Final diagnoses:  Wound of sacral region, initial encounter  Anemia, unspecified type  Constipation, unspecified constipation type  Pulmonary nodule     Rx / DC Orders   ED  Discharge Orders          Ordered    cephALEXin (KEFLEX) 500 MG capsule  4 times daily        10/18/23 2344             Note:  This document was prepared using Dragon voice recognition software and may include unintentional dictation errors.   Trinna Post, MD 10/18/23 2350

## 2023-10-18 NOTE — Progress Notes (Signed)
Patient had 70 ml omni 300 extravasation left ac dr ray assessed the patient,iv was removed ,arm elevated,ice was placed at the site,verbal instructions were given to the patient,a verbal handoff was given to rn taylor vickers,extravasation discharge orders were placed.

## 2023-10-18 NOTE — Discharge Instructions (Signed)
Take your antibiotics as prescribed.  Continue to follow-up with home health for your wound care needs.  Maintain a bowel regimen to prevent constipation.  Follow-up with your primary care doctor for further evaluation.  Return to the ER for any new or worsening symptoms.

## 2023-10-18 NOTE — ED Triage Notes (Signed)
Unable to see hgb results in epic. Pt denies complaints. Pt pleasantly confused.

## 2023-11-01 DEATH — deceased

## 2024-06-07 ENCOUNTER — Ambulatory Visit: Payer: 59 | Admitting: Physician Assistant
